# Patient Record
Sex: Male | Born: 1935
Health system: Southern US, Community
[De-identification: ages and names within clinical notes are randomized; demographics above are authoritative.]

## PROBLEM LIST (undated history)

## (undated) DIAGNOSIS — E785 Hyperlipidemia, unspecified: Secondary | ICD-10-CM

## (undated) DIAGNOSIS — I10 Essential (primary) hypertension: Secondary | ICD-10-CM

## (undated) DIAGNOSIS — J449 Chronic obstructive pulmonary disease, unspecified: Secondary | ICD-10-CM

## (undated) DIAGNOSIS — I219 Acute myocardial infarction, unspecified: Secondary | ICD-10-CM

## (undated) DIAGNOSIS — Z951 Presence of aortocoronary bypass graft: Secondary | ICD-10-CM

## (undated) DIAGNOSIS — I639 Cerebral infarction, unspecified: Secondary | ICD-10-CM

## (undated) DIAGNOSIS — N289 Disorder of kidney and ureter, unspecified: Secondary | ICD-10-CM

## (undated) DIAGNOSIS — I2510 Atherosclerotic heart disease of native coronary artery without angina pectoris: Secondary | ICD-10-CM

## (undated) HISTORY — DX: Disorder of kidney and ureter, unspecified: N28.9

## (undated) HISTORY — DX: Presence of aortocoronary bypass graft: Z95.1

## (undated) HISTORY — DX: Hyperlipidemia, unspecified: E78.5

---

## 1992-01-14 DIAGNOSIS — Z951 Presence of aortocoronary bypass graft: Secondary | ICD-10-CM

## 1992-01-14 HISTORY — PX: CORONARY ARTERY BYPASS GRAFT: SHX141

## 1992-01-14 HISTORY — DX: Presence of aortocoronary bypass graft: Z95.1

## 1999-07-27 ENCOUNTER — Encounter: Payer: Self-pay | Admitting: Emergency Medicine

## 1999-07-27 ENCOUNTER — Emergency Department (HOSPITAL_COMMUNITY): Admission: EM | Admit: 1999-07-27 | Discharge: 1999-07-27 | Payer: Self-pay | Admitting: Emergency Medicine

## 2002-12-25 ENCOUNTER — Inpatient Hospital Stay (HOSPITAL_COMMUNITY): Admission: EM | Admit: 2002-12-25 | Discharge: 2002-12-27 | Payer: Self-pay | Admitting: Emergency Medicine

## 2002-12-26 ENCOUNTER — Encounter (INDEPENDENT_AMBULATORY_CARE_PROVIDER_SITE_OTHER): Payer: Self-pay | Admitting: *Deleted

## 2003-01-09 ENCOUNTER — Ambulatory Visit (HOSPITAL_COMMUNITY): Admission: RE | Admit: 2003-01-09 | Discharge: 2003-01-09 | Payer: Self-pay | Admitting: Neurology

## 2003-01-17 ENCOUNTER — Encounter: Admission: RE | Admit: 2003-01-17 | Discharge: 2003-02-01 | Payer: Self-pay | Admitting: Ophthalmology

## 2003-04-27 ENCOUNTER — Inpatient Hospital Stay (HOSPITAL_COMMUNITY): Admission: EM | Admit: 2003-04-27 | Discharge: 2003-04-29 | Payer: Self-pay | Admitting: Emergency Medicine

## 2003-04-28 HISTORY — PX: CORONARY ANGIOPLASTY WITH STENT PLACEMENT: SHX49

## 2003-05-15 ENCOUNTER — Inpatient Hospital Stay (HOSPITAL_COMMUNITY): Admission: EM | Admit: 2003-05-15 | Discharge: 2003-05-18 | Payer: Self-pay

## 2003-06-06 ENCOUNTER — Emergency Department (HOSPITAL_COMMUNITY): Admission: EM | Admit: 2003-06-06 | Discharge: 2003-06-06 | Payer: Self-pay | Admitting: Emergency Medicine

## 2004-12-03 ENCOUNTER — Emergency Department (HOSPITAL_COMMUNITY): Admission: EM | Admit: 2004-12-03 | Discharge: 2004-12-03 | Payer: Self-pay | Admitting: Emergency Medicine

## 2004-12-20 ENCOUNTER — Encounter: Admission: RE | Admit: 2004-12-20 | Discharge: 2004-12-20 | Payer: Self-pay | Admitting: Specialist

## 2005-04-09 IMAGING — CT CT ANGIO HEAD
2 of 13 series · 3 of 33 positions shown · IV contrast (120 ML OMNI 300)
Comparison: none

CLINICAL DATA: Weakness.  Possible stroke. 
CT ANGIO NECK
Initial noncontrast images of the neck were followed by 1.25 mm axial images during the bolus infusion of 100 cc of contrast.  From this axial data set coronal, sagittal and oblique formatted images were obtained. 
Axial images reveal bilateral carotid calcification at the bifurcation.  Soft tissues are relatively symmetric.  There is no significant adenopathy present.  
The right carotid contains a significant stenosis. I would estimate it to be 75 to 90 percent.  There may be some mild post-stenotic dilatation.  A significant portion of this plaque is calcified.
On the left, there is significant carotid calcification as well.  There does not appear to be the same degree of stenosis as is present on the right.  I would estimate the degree of stenosis to be approximately 50 percent but it is not flow reducing.  
IMPRESSION
1.  75 to 90 percent right internal carotid artery stenosis at its origin. 
2.  50 percent stenosis of the left internal carotid artery at its origin. 
3.  Both stenoses are heavily calcified. 
CT ANGIO OF THE HEAD 
Following examination of the neck, CT angiography of the head was performed.  Again, 1.25 mm non-contrast images were obtained from the skull base through the circle of Willis followed by bolus infusion of 100 cc of contrast.  From the axial data set , sagittal, coronal, and 3-D reformations were generated. 
  Non-contrast examination through the skull base shows no erosive or significant calcific lesion.  The unenhanced appearance of the visualized portions of the brain is within normal limits.  There appears to be a 50 to 75 percent stenosis in the cavernous ICA on the right.  Mild non-stenotic atheromatous change is seen in the left carotid cavernous segment.  I do not detect a significant MCA stenosis in the M1 segments intracranially. 
1.  Estimated 50 percent cavernous stenosis right ICA.
2.  Non-stenotic irregularity of the left cavernous ICA.
3.  No significant intracranial stenosis of the MCA trunks proximally can be identified.

[Series 6: recon 2: cta neck · axial · 0.29mm/px · z∈[-206,-138]mm · 2 of 165 slices shown]
[im 55/165  soft-tissue]
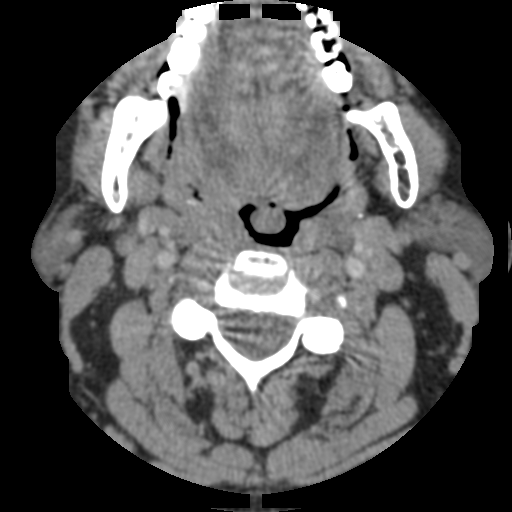
[im 110/165  bone]
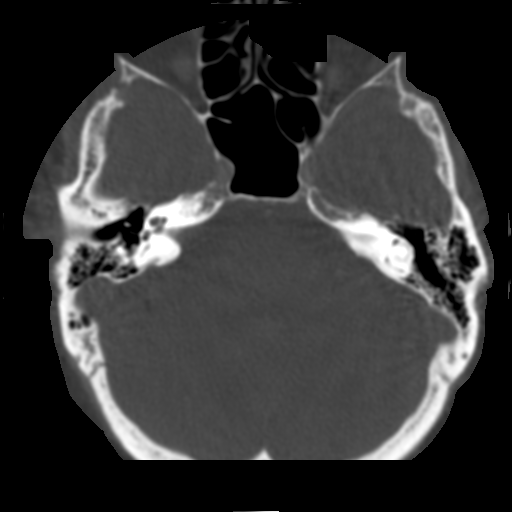

[Series 604: reformatted · sagittal · 0.29mm/px · 1 of 59 slices shown]
[im 30/59  soft-tissue]
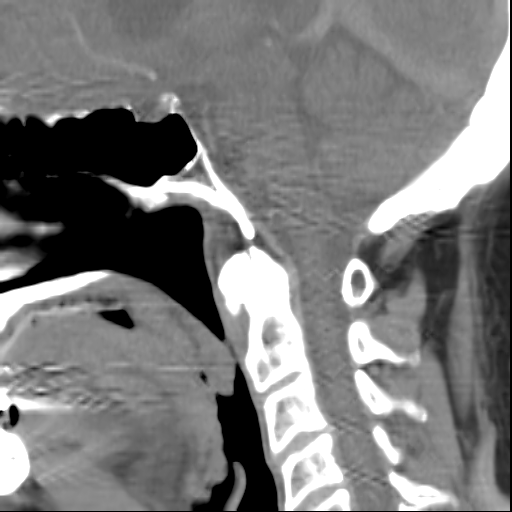

[3 of 33 positions shown; findings below may reference images not displayed]

## 2006-03-08 ENCOUNTER — Ambulatory Visit: Payer: Self-pay | Admitting: Internal Medicine

## 2006-03-08 ENCOUNTER — Inpatient Hospital Stay (HOSPITAL_COMMUNITY): Admission: EM | Admit: 2006-03-08 | Discharge: 2006-03-10 | Payer: Self-pay | Admitting: *Deleted

## 2008-09-10 ENCOUNTER — Emergency Department (HOSPITAL_COMMUNITY): Admission: EM | Admit: 2008-09-10 | Discharge: 2008-09-10 | Payer: Self-pay | Admitting: Emergency Medicine

## 2008-10-13 HISTORY — PX: TRANSTHORACIC ECHOCARDIOGRAM: SHX275

## 2009-12-15 ENCOUNTER — Inpatient Hospital Stay (HOSPITAL_COMMUNITY)
Admission: EM | Admit: 2009-12-15 | Discharge: 2009-12-19 | Payer: Self-pay | Source: Home / Self Care | Attending: Internal Medicine | Admitting: Internal Medicine

## 2009-12-16 ENCOUNTER — Encounter (INDEPENDENT_AMBULATORY_CARE_PROVIDER_SITE_OTHER): Payer: Self-pay | Admitting: Internal Medicine

## 2009-12-17 ENCOUNTER — Encounter (INDEPENDENT_AMBULATORY_CARE_PROVIDER_SITE_OTHER): Payer: Self-pay | Admitting: Internal Medicine

## 2009-12-18 ENCOUNTER — Encounter (INDEPENDENT_AMBULATORY_CARE_PROVIDER_SITE_OTHER): Payer: Self-pay | Admitting: Internal Medicine

## 2010-03-25 LAB — CROSSMATCH
ABO/RH(D): O NEG
ABO/RH(D): O NEG
Antibody Screen: NEGATIVE
Antibody Screen: NEGATIVE
Unit division: 0
Unit division: 0

## 2010-03-25 LAB — BASIC METABOLIC PANEL
BUN: 7 mg/dL (ref 6–23)
CO2: 20 mEq/L (ref 19–32)
CO2: 21 mEq/L (ref 19–32)
CO2: 24 mEq/L (ref 19–32)
Calcium: 7.6 mg/dL — ABNORMAL LOW (ref 8.4–10.5)
Calcium: 8.1 mg/dL — ABNORMAL LOW (ref 8.4–10.5)
Calcium: 8.3 mg/dL — ABNORMAL LOW (ref 8.4–10.5)
Chloride: 110 mEq/L (ref 96–112)
Creatinine, Ser: 1.27 mg/dL (ref 0.4–1.5)
Creatinine, Ser: 1.9 mg/dL — ABNORMAL HIGH (ref 0.4–1.5)
GFR calc Af Amer: 42 mL/min — ABNORMAL LOW (ref >60)
GFR calc Af Amer: 60 mL/min — ABNORMAL LOW (ref >60)
GFR calc Af Amer: >60 mL/min (ref >60)
GFR calc non Af Amer: 35 mL/min — ABNORMAL LOW (ref >60)
GFR calc non Af Amer: 49 mL/min — ABNORMAL LOW (ref >60)
GFR calc non Af Amer: >60 mL/min (ref >60)
Potassium: 4 mEq/L (ref 3.5–5.1)
Potassium: 4.2 mEq/L (ref 3.5–5.1)
Sodium: 136 mEq/L (ref 135–145)
Sodium: 137 mEq/L (ref 135–145)
Sodium: 138 mEq/L (ref 135–145)

## 2010-03-25 LAB — URINALYSIS, ROUTINE W REFLEX MICROSCOPIC
Bilirubin Urine: NEGATIVE
Glucose, UA: NEGATIVE mg/dL
Hgb urine dipstick: NEGATIVE
Ketones, ur: NEGATIVE mg/dL
Nitrite: NEGATIVE
Protein, ur: NEGATIVE mg/dL
Specific Gravity, Urine: 1.024 (ref 1.005–1.030)
Urobilinogen, UA: 0.2 mg/dL (ref 0.0–1.0)
pH: 5.5 (ref 5.0–8.0)

## 2010-03-25 LAB — FOLATE: Folate: >20 ng/mL

## 2010-03-25 LAB — CBC
HCT: 26.4 % — ABNORMAL LOW (ref 39.0–52.0)
HCT: 26.7 % — ABNORMAL LOW (ref 39.0–52.0)
HCT: 27.5 % — ABNORMAL LOW (ref 39.0–52.0)
Hemoglobin: 8.8 g/dL — ABNORMAL LOW (ref 13.0–17.0)
Hemoglobin: 8.8 g/dL — ABNORMAL LOW (ref 13.0–17.0)
Hemoglobin: 9.1 g/dL — ABNORMAL LOW (ref 13.0–17.0)
MCH: 26.1 pg (ref 26.0–34.0)
MCH: 27.8 pg (ref 26.0–34.0)
MCH: 28.1 pg (ref 26.0–34.0)
MCHC: 32 g/dL (ref 30.0–36.0)
MCHC: 32.9 g/dL (ref 30.0–36.0)
MCV: 81.6 fL (ref 78.0–100.0)
MCV: 84.2 fL (ref 78.0–100.0)
Platelets: 176 10*3/uL (ref 150–400)
Platelets: 187 10*3/uL (ref 150–400)
Platelets: 252 10*3/uL (ref 150–400)
RBC: 3.15 MIL/uL — ABNORMAL LOW (ref 4.22–5.81)
RBC: 3.24 MIL/uL — ABNORMAL LOW (ref 4.22–5.81)
RBC: 3.37 MIL/uL — ABNORMAL LOW (ref 4.22–5.81)
RDW: 16.1 % — ABNORMAL HIGH (ref 11.5–15.5)
RDW: 16.2 % — ABNORMAL HIGH (ref 11.5–15.5)
RDW: 16.4 % — ABNORMAL HIGH (ref 11.5–15.5)
WBC: 5.4 10*3/uL (ref 4.0–10.5)
WBC: 6 10*3/uL (ref 4.0–10.5)
WBC: 7.6 K/uL (ref 4.0–10.5)

## 2010-03-25 LAB — POCT CARDIAC MARKERS
CKMB, poc: <1 ng/mL — ABNORMAL LOW (ref 1.0–8.0)
Myoglobin, poc: 105 ng/mL (ref 12–200)
Troponin i, poc: <0.05 ng/mL (ref 0.00–0.09)

## 2010-03-25 LAB — ABO/RH: ABO/RH(D): O NEG

## 2010-03-25 LAB — RETICULOCYTES
RBC.: 2.9 MIL/uL — ABNORMAL LOW (ref 4.22–5.81)
Retic Count, Absolute: 29 10*3/uL (ref 19.0–186.0)
Retic Ct Pct: 1 % (ref 0.4–3.1)

## 2010-03-25 LAB — APTT: aPTT: 31 seconds (ref 24–37)

## 2010-03-25 LAB — IRON AND TIBC
Iron: 49 ug/dL (ref 42–135)
Saturation Ratios: 16 % — ABNORMAL LOW (ref 20–55)
TIBC: 314 ug/dL (ref 215–435)
UIBC: 265 ug/dL

## 2010-03-25 LAB — SAMPLE TO BLOOD BANK

## 2010-03-25 LAB — DIFFERENTIAL
Basophils Absolute: 0 K/uL (ref 0.0–0.1)
Basophils Relative: 0 % (ref 0–1)
Eosinophils Absolute: 0 10*3/uL (ref 0.0–0.7)
Eosinophils Relative: 1 % (ref 0–5)
Lymphocytes Relative: 21 % (ref 12–46)
Lymphs Abs: 1.6 10*3/uL (ref 0.7–4.0)
Monocytes Absolute: 0.9 K/uL (ref 0.1–1.0)
Monocytes Relative: 12 % (ref 3–12)
Neutro Abs: 5 10*3/uL (ref 1.7–7.7)
Neutrophils Relative %: 66 % (ref 43–77)

## 2010-03-25 LAB — BASIC METABOLIC PANEL WITH GFR
BUN: 44 mg/dL — ABNORMAL HIGH (ref 6–23)
Chloride: 104 meq/L (ref 96–112)
Glucose, Bld: 131 mg/dL — ABNORMAL HIGH (ref 70–99)
Potassium: 4.3 meq/L (ref 3.5–5.1)

## 2010-03-25 LAB — HEMOCCULT GUIAC POC 1CARD (OFFICE): Fecal Occult Bld: POSITIVE

## 2010-03-25 LAB — HEMOGLOBIN AND HEMATOCRIT, BLOOD
HCT: 26.6 % — ABNORMAL LOW (ref 39.0–52.0)
HCT: 30.6 % — ABNORMAL LOW (ref 39.0–52.0)
Hemoglobin: 10 g/dL — ABNORMAL LOW (ref 13.0–17.0)

## 2010-03-25 LAB — FERRITIN: Ferritin: 11 ng/mL — ABNORMAL LOW (ref 22–322)

## 2010-03-25 LAB — PROTIME-INR
INR: 1.17 (ref 0.00–1.49)
Prothrombin Time: 15.1 seconds (ref 11.6–15.2)

## 2010-03-25 LAB — VITAMIN B12: Vitamin B-12: >2000 pg/mL — ABNORMAL HIGH (ref 211–911)

## 2010-04-20 LAB — BASIC METABOLIC PANEL
BUN: 11 mg/dL (ref 6–23)
GFR calc Af Amer: >60 mL/min (ref >60)
GFR calc non Af Amer: 51 mL/min — ABNORMAL LOW (ref >60)
Potassium: 4.4 mEq/L (ref 3.5–5.1)
Sodium: 138 mEq/L (ref 135–145)

## 2010-04-20 LAB — CBC
HCT: 37.3 % — ABNORMAL LOW (ref 39.0–52.0)
Platelets: 262 10*3/uL (ref 150–400)
RBC: 3.96 MIL/uL — ABNORMAL LOW (ref 4.22–5.81)
WBC: 10.7 10*3/uL — ABNORMAL HIGH (ref 4.0–10.5)

## 2010-04-20 LAB — URINALYSIS, ROUTINE W REFLEX MICROSCOPIC
Ketones, ur: NEGATIVE mg/dL
Nitrite: NEGATIVE
Specific Gravity, Urine: 1.02 (ref 1.005–1.030)
pH: 6 (ref 5.0–8.0)

## 2010-04-20 LAB — DIFFERENTIAL
Eosinophils Relative: 3 % (ref 0–5)
Lymphocytes Relative: 13 % (ref 12–46)
Lymphs Abs: 1.4 10*3/uL (ref 0.7–4.0)

## 2010-05-31 NOTE — Discharge Summary (Signed)
NAME:  Frederick Kirby, Frederick Kirby                           ACCOUNT NO.:  [id number]   MEDICAL RECORD NO.:  [id number]                   PATIENT TYPE:  INP   LOCATION:  3038                                 FACILITY:  [facility]   PHYSICIAN:  Pramod P. Pearlean Brownie, MD                 DATE OF BIRTH:  28-Dec-1935   DATE OF ADMISSION:  05/15/2003  DATE OF DISCHARGE:  05/18/2003                                 DISCHARGE SUMMARY   __________   ADMISSION DIAGNOSIS:  Transient ischemic attack.   DISCHARGE DIAGNOSES:  1. Generalized symptoms of weakness, numbness, and blurred vision of unclear     etiology.  No definite new stroke visualized on computed tomograph scan.     Magnetic resonance angiography not possible due to recent cardiac stent.  2. Ischemic heart disease.  3. Hyperlipidemia.  4. Hypertension.  5. Hyperhomocysteinemia.   SUBJECTIVE:  The patient is a 75 year old gentleman who was admitted for  evaluation for symptoms of generalized weakness, blurred vision, and slurred  speech.  When seen by Dr. Vickey Huger on admission, had complained of some  right facial numbness, weakness, and blurred vision and her initial  impression was of vertebrobasilar TIA.  However, when I spoke to the  patient, she denied right-sided weakness and just told me that he had had a  gradual worsening of his previously existing neurological deficit of left  face and upper extremity paraesthesia following a recent stroke.  These  symptoms had continued actually for the last three to four weeks even prior  to recent cardiac stent.  The patient had undergone cardiac stent placed on  April 28, 2003, __________ for these symptoms, which have apparently  persisted even after the stent.  He was admitted to the stroke unit for risk  stratification workup.  He was placed on telemetry monitoring, which did not  reveal any cardiac arrhythmias.  Noncontrast CT scan of the head revealed no  evidence of any acute infarction.  He had  previously had stroke in December  of 2004 with some residual left upper extremity weakness, clumsiness, and  paresthesias which had persisted on and off.  MRA at that time had showed 50-  60% right internal carotid artery stenosis.  The patient was placed on  antiplatelet therapy and was on Plavix.  Following his recent cardiac stent,  aspirin was added to the Plavix.  The patient had other vascular risk  factors in the form of hypertension and hyperlipidemia.  The patient  subsequently underwent a CT angiogram since an MRI and MRA could not be done  due to his recent cardiac stent.  The CT angiogram suggested calcific lesion  in the right internal carotid artery with a possible 75-90% right ICA  stenosis.  However, on the carotid ultrasound, the peak systolic velocity in  the right internal carotid artery was only 164 with end-diastolic velocity  of 45 and this  fitted more with the 60-70% stenosis by ultrasound.  Due to  the discordant findings between the CTA and the carotid ultrasound, a  catheter angiogram was done to evaluate the right internal carotid artery  further by Dr. Corliss Skains on May 17, 2003.  This revealed only a 50-60% right  internal carotid artery stenosis with a small pseudoaneurysm.  There was  mild stenosis noted of the left vertebral artery ostium.  The patient has  noted improvement in his symptoms and on the date of discharge he said that  he had only minimal left face paresthesias.  He was able to ambulate  independently and he had only minimal decreased fine finger movements on the  left.  The rest of the laboratory evaluation showed elevated cholesterol of  224 with elevated LDL of 151.  The homocystine was also elevated at 16.5.  The hemoglobin A1c was normal.  The WBC count and blood chemistries were  unremarkable.  The urine drug screen was negative.  The patient was started  on Foltx one tablet daily for his elevated homocystine and advised to  increase the  dose of his Lovastatin from 20-30 mg a day for his elevated  lipids.  He was advised to stay on the aspirin and Plavix for the next four  to six weeks as indicated by his cardiologist and subsequently go back on  Plavix alone.  He would require serial six monthly carotid ultrasound  followup for his right ICA stenosis.  He was advised to follow up with Dr.  Pearlean Brownie in two months and with his cardiologist, Dr. Tresa Endo, as needed and with  his primary physician, Dr. Jeannetta Nap, in two to three weeks.   DISCHARGE MEDICATIONS:  1. Aspirin 81 mg a day.  2. Plavix 75 mg a day.  3. Hydrochlorothiazide 12.5 mg a day.  4. Toprol XL 25 mg once a day.  5. Lovastatin 30 mg a day.  6. Foltx one tablet daily.                                                Pramod P. Pearlean Brownie, MD    PPS/MEDQ  D:  05/18/2003  T:  05/18/2003  Job:  [id number]   cc:   Dr. Nicole Kindred, M.D.  [address], [address]  [place], [place] [address]  Fax: [phone]

## 2010-05-31 NOTE — Discharge Summary (Signed)
Frederick Kirby, Frederick Kirby                 ACCOUNT NO.:  [id number]   MEDICAL RECORD NO.:  [id number]          PATIENT TYPE:  INP   LOCATION:  6741                         FACILITY:  [facility]   PHYSICIAN:  Edsel Petrin, D.O.DATE OF BIRTH:  [birth year]   DATE OF ADMISSION:  03/08/2006  DATE OF DISCHARGE:  03/10/2006                               DISCHARGE SUMMARY   DISCHARGE DIAGNOSES:  1. Community-acquired pneumonia.  2. Hyponatremia secondary to volume depletion.  3. Chronic obstructive pulmonary disease exacerbation.  4. History of transient ischemic attack in 2006.  5. Coronary artery disease with cardiac stent placed in 2005.  6. Coronary artery bypass graft surgery in 1997.  7. History of cerebrovascular accident in 2004 with right upper      extremity weakness.  8. Right internal carotid artery stenosis.  9. Hyperlipidemia.  10.Hypertension, controlled on p.o. medications.  11.History of tobacco abuse, quit smoking in December 2007.  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   DISCHARGE MEDICATIONS:  1. Avalox 400 mg one tab daily x7 days.  2. Prednisone taper 60, 50, 40, 30, 20, 10 x6 days.  3. Albuterol MDI one to two puffs q.4-6 hours for wheezing.  4. Spiriva one inhalation treatment daily.  5. Aspirin 81 mg daily.  6. Metoprolol 25 mg daily.  7. Vytorin 10-80 daily.  8. Plavix 75 mg daily.  9. Norvasc 10 mg daily.  10.Altace 10 mg daily.  11.Trazodone 150 mg nightly.  12.Discontinued hydrochlorothiazide.  13.Discontinued amoxicillin.   DISPOSITION AND FOLLOWUP:  Frederick Kirby was discharged in good condition to  his private residence.  He has a followup appointment with Dr. Jeannetta Kirby in  one to two weeks.  At this time, he will need to be evaluated for  resolution of his community-acquired pneumonia and undergo management  and evaluation of his chronic lung disease.   PROCEDURES PERFORMED:  None.   CONSULTATIONS:  None.   BRIEF ADMITTING H&P:  Frederick Kirby  is a 75 year old man who presented to  the emergency department complaining of increased shortness of breath  and pleuritic chest pain for seven days prior to admission.  He reported  one week of flu-like symptoms including cough, congestion, fever, and  muscle aches.  He failed to improve with conservative care and felt like  his symptoms were getting worse.  He saw Dr. Jeannetta Kirby three days prior to  admission, who treated him symptomatically and gave him amoxicillin for  an upper respiratory tract infection.  He continued to have cough with  sputum production and constitutional signs and symptoms of infection,  including decreased appetite and fatigue.   Please see paper chart for complete details of admission H&P.   VITAL SIGNS ON ADMISSION:  Temperature 96.9, blood pressure 141/73,  pulse 63, respiratory rate 22, O2 saturation 91% on room air.   PHYSICAL EXAMINATION:  GENERAL:  Frederick Kirby was in no acute distress.  He  was speaking in normal sentences.  He was not tachypneic.  LUNGS:  On auscultation of his lungs he had decreased air movement in  his bases  bilaterally.  He had scattered wheezes, rhonchi, and had  bilateral basilar crackles.  HEART:  On auscultation of his heart he had a mildly irregular rhythm,  characterized by mostly PVCs.  A 2/6 mid systolic murmur was  auscultated.  He had no extremity edema.  He had no evidence of  cyanosis.  NEUROLOGICAL:  Consistent with his reports of a historic CVA effecting  his left side with mildly decreased sensation on the left, but his  strength was otherwise intact and there were no additional focal  findings.   LABORATORIES ON ADMISSION:  Sodium 122, potassium 3.8, chloride 90,  bicarbonate 21, BUN 9, creatinine 0.8, glucose 135.  Anion gap 11.  LFTs  within normal limits.  On CBC:  WBC 7.8, hemoglobin 14.7, hematocrit  42.4, platelets 188.  Chest x-ray showed bibasilar atelectasis,  infiltrate in the right medial base.  BNP was  less than 30.   HOSPITAL COURSE BY PROBLEM:  Problem #1:  Community-acquired pneumonia:  Frederick Kirby had evidence of an early pneumonia seen in the right medial  lung base on chest x-ray.  He was empirically started on IV Rocephin and  azithromycin pending respiratory cultures.  He was also supported with  O2 by nasal cannula.  He maintained his O2 saturations greater than 90%,  so the O2 was titrated to room air.  He was treated symptomatically with  albuterol and Atrovent nebulizers for his wheezing.  He was also placed  on a prednisone taper, suspecting that there is an underlying chronic  lung disease, such as COPD causing the severity of his symptoms.  He  improved symptomatically with treatment.  He was transitioned to p.o.  Avelox and will complete seven days of Avelox upon discharge.  He was  rulled out for cardiac chest pain with a normal 12-lead EKG, no  abnormalities on telemetry monitoring and negative cardiac enzymes.   Problem #2:  Hyponatremia:  Frederick Kirby hyponatremia was secondary to  volume depletion from decreased p.o. intake secondary to his illness.  He responded to normal saline fluid boluses and continuous fluid  administration.  We slowly corrected his hyponatremia to a near normal  level at discharge.  With resolution of his infection and resumption of  normal p.o. intake, this imbalance will likely self-correct.  We held  his hydrochlorothiazide diuretic, as to not exacerbate his electrolyte  imbalance and this can be restarted once he regains normal intravascular  volume.   DISCHARGE VITAL SIGNS:  Temperature 97.7, pulse 64, respirations 18,  blood pressure 108/58, O2 saturation 93% on room air.   DISCHARGE LABORATORIES:  Sodium 133, potassium 4.2, chloride 102,  bicarbonate 21, glucose 111, BUN 13, creatinine 0.9.  Sputum culture was  not representative of lower respiratory secretions.  Legionella antigen was negative.  Strep pneumonia urinary antigen was  negative.   The patient received a flu vaccination, as well as a pneumococcal  vaccination at discharge.      Edsel Petrin, D.O.  Electronically Signed     ELG/MEDQ  D:  03/18/2006  T:  03/19/2006  Job:  [id number]   cc:   Windle Guard, M.D.

## 2010-05-31 NOTE — H&P (Signed)
NAME:  Frederick Kirby, Frederick Kirby                           ACCOUNT NO.:  [id number]   MEDICAL RECORD NO.:  [id number]                   PATIENT TYPE:  INP   LOCATION:  1832                                 FACILITY:  [facility]   PHYSICIAN:  Nicki Guadalajara, M.D.                  DATE OF BIRTH:  1935-09-04   DATE OF ADMISSION:  04/27/2003  DATE OF DISCHARGE:                                HISTORY & PHYSICAL   CHIEF COMPLAINT:  Chest pain, arm pain, and fatigue.   HISTORY OF PRESENT ILLNESS:  The patient is a 75 year old male who had  bypass surgery x 9 in 1994.  He basically had no cardiac followup since then  but has done well.  In December 2004, he was admitted by the neurology  service after he awakened with left hand paralysis and left leg weakness.  He was found to have a right parietal stroke.  From that standpoint, he has  been making progress, but he complains of weakness and early fatigability  since his stroke.  Yesterday he had an episode of substernal chest pain  described as an ache that radiated to his arms and his neck.  He denies  shortness of breath, nausea, or diaphoresis.  He was sent from Dr. Jeannetta Nap  office for further evaluation.   PAST MEDICAL HISTORY:  1. Hypertension.  2. Hyperlipidemia.   There is no prior history of diabetes.   CURRENT MEDICATIONS:  1. Lovastatin daily.  2. Aspirin daily.  3. Hydrochlorothiazide 25 mg daily.   He was put on Crestor during his hospitalization on the neurology service,  but Dr. Jeannetta Nap has changed this to Lovastatin.  Apparently he could not  tolerate CRESTOR.   ALLERGIES:  No known drug allergies.   SOCIAL HISTORY:  He is married.  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  He has  five horses.  He is a former two-pack-a-day smoker until his recent stroke.  He also used to drink a six-pack of beer a day until his stroke.  He has  four children and 18 grandchildren.   FAMILY HISTORY:  His father died in his 63s of black lung.  His mother  died  of cancer in her 63s.  He has one brother who died of an aneurysm.   REVIEW OF SYSTEMS:  During his hospital admission in December, he had  moderate right internal carotid artery stenosis by Doppler.  This is being  followed by the neurology service.  His echocardiogram showed normal LV  function.  There is no history of GI bleeding or melena.  Recent stool  guaiac done by Dr. Jeannetta Nap was negative.  He has not had prostate trouble and  does not have dysuria.  He denies any claudication.   PHYSICAL EXAMINATION:  VITAL SIGNS:  Blood pressure 176/91 on admission to  the emergency room.  Heart rate 78, respirations 12.  GENERAL: Well-developed, well-nourished male in no acute distress.  HEENT:  Normocephalic.  Extraocular movements are intact.  Sclerae are  nonicteric.  Lids and conjunctivae are  within normal limits.  NECK:  Without JVD.  He does have a soft left carotid artery bruit.  CHEST:  Reveals a few rhonchi on the right, otherwise clear.  CARDIAC:  Regular rate and rhythm without murmur, rub, or gallop.  Normal S1  and S2.  ABDOMEN:  Nontender.  No hepatosplenomegaly, no bruits.  EXTREMITIES:  No edema.  He has 2+/4 posterior tibial pulses bilaterally.  He has soft femoral artery bruits bilaterally.  NEUROLOGIC:  He does have some residual weakness in his left arm and leg,  but this is definitely improved from his stroke.   LABORATORY AND X-RAY DATA:  EKG shows sinus rhythm with nonspecific ST  changes.   Labs and chest x-ray are pending.   IMPRESSION:  1. Unstable angina.  2. Coronary artery disease, coronary artery bypass grafting x 9 in 1994 with     normal left ventricular function by recent echocardiogram.  3. Right brain cerebrovascular accident December 2004.  4. Hypertension.  5. Chronic obstructive pulmonary disease and smoking history.  6. Hyperlipidemia.  7. Peripheral vascular disease with moderate right internal carotid artery     stenosis by Doppler.    PLAN:  1. The patient will be admitted to telemetry.  2. Start IV heparin.  3. Rule out for MI.  4. Will check labs including a TSH.  5. He may need diagnostic catheterization and will keep him n.p.o. after     midnight.      Abelino Derrick, P.A.                      Nicki Guadalajara, M.D.    Lenard Lance  D:  04/27/2003  T:  04/27/2003  Job:  [id number]

## 2010-05-31 NOTE — H&P (Signed)
NAME:  MEL, LANGAN                           ACCOUNT NO.:  [id number]   MEDICAL RECORD NO.:  [id number]                   PATIENT TYPE:  EMS   LOCATION:  MAJO                                 FACILITY:  [facility]   PHYSICIAN:  Melvyn Novas, M.D.               DATE OF BIRTH:  1935-11-08   DATE OF ADMISSION:  05/15/2003  DATE OF DISCHARGE:                                HISTORY & PHYSICAL   Mr. Moffa is a patient of Pramod P. Pearlean Brownie, M.D., and Nicki Guadalajara, M.D., at  the [facility], who was admitted in December of  2004 with a stroke and had acute parietal and occipital infarctions  secondary to intracranial atherosclerotic occlusive disease.  He also  suffered from risk factors, such has hyperlipidemia and hypertension and he  was at that time still an active smoker.  During his hospitalization, Mr.  Fjeld was also diagnosed with coronary artery disease.  He just underwent a  stent placement on April 28, 2003, by Richard A. Alanda Amass, M.D.  He was  told that there are still arteries in his heart that could not be reopened  or stented.  He states that with his initial stroke, he had left hand  weakness, clumsiness, and even left face with esthesias, as well as grip and  pinch strength loss from which he recovered.  He became short of breath  prior to having his angioplasty treatments and stent placement and had  recovered from that step then very well indeed.  He states that he felt very  good for the last 10 days until two days ago when he again became short of  breath, this time associated with some right-sided facial numbness and right  arm weakness and numbness, blurring of vision, and according to his wife,  dysarthria.  This is an entirely new picture for the patient, who fears that  he might have suffered a new stroke and therefore presented to the ER.   MEDICATIONS:  He was discharged on:  1. Plavix.  2. Aspirin.  3. Cholesterol-lowering drug.  4.  Lovastatin 20 mg a day.  5. Hydrochlorothiazide 12.5 mg a day.  6. Toprol 25 mg a day.  7. Nitroglycerin sublingual p.r.n., which he still takes.   ALLERGIES:  He has no known drug allergies.   FAMILY HISTORY:  Hypertension.  Two brothers had bypass surgery.  One  brother suffered from a malignancy.  One brother had an abdominal aortic  aneurysm that ruptured.   SOCIAL HISTORY:  The patient is an ex-smoker, stopped three weeks ago.  He  is married and has four healthy children and some stepchildren with his  second wife.  One of his sons has hypercholesterolemia at age 68.   PHYSICAL EXAMINATION:  VITAL SIGNS:  The patient's blood pressure is today  149/81, heart rate is regular at 64, temperature 98 degrees Fahrenheit, and  oxygenation  on room air 96%.  HEENT:  The mucous membranes are pale, but perfused.  SKIN:  No edema.  No clubbing.  The patient has pale feet, cold to touch  with decreased dorsalis pedis pulses, but they are not bluish.  NECK:  No carotid bruit.  MENTAL STATUS:  Alert and oriented x 3.  I cannot detect dysarthria or  aphasia.  NEUROLOGIC:  Cranial Nerves:  Pupils react equally to light and  accommodation.  Full extraocular movements.  No visual field deficits.  No  papilledema.  No facial asymmetry or dysphagia.  Tongue and uvula midline.  Full neck range of motion.  Motor Exam:  5/5 bilaterally.  Decreased toes  bilaterally to plantar stimulation.  Deep tendon reflexes 1+.  Sensory:  The  patient feels numb in a tingling dysesthetic way that is present on both  lower extremities in his feet, on the right side of his face, and was  temporarily present for his right upper extremity.  Now he states that only  the fingers tingle and this occurs on both hands.  Gait:  Fully intact.  The  patient complains subjectively of dizziness.  His Romberg is negative.  He  did complain of vertigo once he reclined in his hospital bed and there could  be a vertebrobasilar  component to this.  No nystagmus was associated.   ASSESSMENT:  Admit for evaluation of vertebrobasilar abnormalities and place  on IV heparin.  He will hold aspirin and Plavix at this time.  We will  consult Drs. Tresa Endo and [place] for followup.  The patient cannot undergo  an MRI due to the recent stent placement.  We have perhaps to make due with  a CT angiogram if Dr. Pearlean Brownie decides that he needs this.  Otherwise a  transcranial Doppler study is suggested.                                                Melvyn Novas, M.D.    CD/MEDQ  D:  05/15/2003  T:  05/15/2003  Job:  [id number]

## 2010-05-31 NOTE — Discharge Summary (Signed)
NAME:  Frederick Kirby, Frederick Kirby                           ACCOUNT NO.:  [id number]   MEDICAL RECORD NO.:  [id number]                   PATIENT TYPE:  INP   LOCATION:  3033                                 FACILITY:  [facility]   PHYSICIAN:  Pramod P. Pearlean Brownie, MD                 DATE OF BIRTH:  07/02/1935   DATE OF ADMISSION:  12/25/2002  DATE OF DISCHARGE:  12/27/2002                                 DISCHARGE SUMMARY   ADMISSION DIAGNOSIS:  Stroke.   DISCHARGE DIAGNOSES:  1. Right parietal and occipital infarction secondary to intracranial     atherosclerotic disease.  2. Hyperlipidemia.  3. Hypertension.   HISTORY OF PRESENT ILLNESS:  Frederick Kirby is a pleasant 75 year old gentleman  who was admitted for evaluation of sudden onset of left hand weakness.  Kindly see Dr. Darl Householder H&P for details.  The patient presented beyond the  timeframe for IV thrombosis or stroke study consideration.  On exam, he had  left-sided dysesthesia and left hand and wrist weakness with good strength  in the proximal muscles.  A noncontrast CT scan of the head was obtained in  the emergency room which did not reveal any active or old infarction.  He  was admitted to the stroke service for risk stratification workup.   LABORATORY DATA:  Telemetry monitoring did not show any significant cardiac  arrhythmias.  MRI scan of the brain showed acute infarction involving the  right parietal lobe and smaller focal infarction in the right occipital lobe  as well.  MRA of the intracranial circulation showed mild atherosclerotic  changes involving the right middle cerebral artery branches.  MRA of the  neck revealed moderate stenosis of the right internal carotid artery with  ulceration, as well as moderate stenosis on the left.  There was probable  high-grade stenosis at the origin of the right vertebral artery.  Carotid  ultrasound confirmed moderate stenosis of the right internal carotid artery,  60-80% only, and mild 40-60% on  the left.  Vertebral artery flow was  antegrade.  Doppler studies were unremarkable.  The lipid profile showed an  elevated total cholesterol of 201.  LDL was elevated at 141.  Triglycerides  and HDL were normal.  Homocystine was normal at 13.48.  The fasting glucose  was 120.  The hemoglobin AC1 was normal at 5.7.  ESR was 2 mm.   HOSPITAL COURSE:  The patient was seen in consultation by speech therapy,  occupational therapy, and physical therapy.  He was started on aspirin for  secondary stroke prevention, as well as Crestor for  hyperlipidemia.  His  urinalysis showed evidence of a urinary tract infection and he was started  on Cipro 500 mg twice a day for a total of seven days.  At the time of  discharge, he had regained some strength in his left hand, but still had  weakness of the intrinsic hand  muscles.  He was advised to follow up as an  outpatient with occupational therapy.  He was started on soft foods and  thickened liquids on the recommendation of speech pathology.   DISCHARGE MEDICATIONS:  1. Aspirin 325 mg daily.  2. Crestor 10 mg daily.  3. Cipro 500 mg twice a day for seven days only.  4. Hydrochlorothiazide 25 mg daily.   FOLLOWUP:  He was advised to follow up with his primary care physician, Dr.  Jeannetta Nap, in one month and with Dr. Pearlean Brownie in two months.                                               Pramod P. Pearlean Brownie, MD    PPS/MEDQ  D:  01/13/2003  T:  01/13/2003  Job:  [id number]   cc:   Dr. Jeannetta Nap

## 2010-05-31 NOTE — Discharge Summary (Signed)
NAME:  Frederick Kirby, Frederick Kirby                           ACCOUNT NO.:  [id number]   MEDICAL RECORD NO.:  [id number]                   PATIENT TYPE:  INP   LOCATION:  6527                                 FACILITY:  [facility]   PHYSICIAN:  Richard A. Alanda Amass, M.D.          DATE OF BIRTH:  1935/08/14   DATE OF ADMISSION:  04/27/2003  DATE OF DISCHARGE:  04/29/2003                                 DISCHARGE SUMMARY   DISCHARGE DIAGNOSES:  1. Subendocardial myocardial infarction this admission.  2. Coronary disease with coronary artery bypass grafting x9 in 1994 with     saphenous vein graft to ramus intermedius, TAXUS stent this admission.  3. Preserved left ventricular function with ejection fraction of 50%.  4. History of smoking.  5. Hypertension.  6. Treated hyperlipidemia.  7. History of right brain cerebrovascular accident December of 2004.   HOSPITAL COURSE:  Mr. Kloepfer is a 75 year old male followed by Dr. Tresa Endo and  Dr. Jeannetta Nap.  He had bypass surgery x9 in 1994.  He essentially had no  cardiac follow-up after that.  He has not had any symptoms.  In December of  2004, he was admitted with a right brain parietal stroke which he is  recovering from.  Since then, he has had weakness.  The day prior to  admission, he had substernal chest pain which radiated to his arms and his  neck.  He was referred from Dr. Jeannetta Nap' office to the ER where we saw him.  He was pain-free on admission.  He has been a smoker up until about a week  ago.  He was admitted to telemetry, started on IV heparin.  Troponins were  positive.  CK peaked at 185 with 12 MB.  He underwent diagnostic  catheterization April 28, 2003, by Dr. Jenne Campus.  This revealed a total RCA,  99% circumflex, total LAD, and total diagonal.  The patient had an SVG to  the LAD that was patent.  He had an SVG to the ramus that had a 95%  stenosis.  There is a question of an occluded SVG to the OM and SVG to the  RCA, he did have left to right  collaterals.  EF was 50%.  Left subclavian  was irregular and the LIMA was atretic.  He underwent TAXUS stenting to the  SVG to the ramus with good result.  Dr. Jenne Campus feels he will probably need  a staged intervention of the circumflex in four to six weeks.  We feel he  can be discharged April 29, 2003.   DISCHARGE MEDICATIONS:  1. Plavix 75 mg a day.  2. Aspirin 81 mg a day.  3. Lovastatin 20 mg a day.  4. Hydrochlorothiazide 12.5 mg a day.  5. Toprol XL 25 mg a day.  6. Nitroglycerin sublingual p.r.n.   LABORATORY DATA:  Sodium 130, potassium 3.4, BUN 7, creatinine 0.9.  CKs  peaked as noted above.  TSH 1.29.  White count 7.9, hemoglobin 14.1,  hematocrit 41.1, platelets 272.  Lipid profile shows cholesterol 201, HDL  45, LDL 138.  INR 0.9.  Liver functions are normal.   EKG reveals sinus rhythm with T-wave inversion lead I and aVL.   Chest x-ray shows low lung volumes with basilar atelectasis.   DISPOSITION:  The patient is discharged in stable condition and will follow  up with Dr. Tresa Endo as an outpatient.  He will most likely need further  adjustment of his lipids.  He will also need to be set up for intervention  in a few weeks.  He has an appointment to see Dr. Tresa Endo, May 17, 2003, at 2  p.m.      Abelino Derrick, P.A.                      Richard A. Alanda Amass, M.D.    Lenard Lance  D:  04/29/2003  T:  05/01/2003  Job:  [id number]   cc:   Windle Guard, M.D.  [address]  [place], [place] [address]  Fax: [phone]

## 2010-05-31 NOTE — Cardiovascular Report (Signed)
NAME:  Frederick Kirby, Frederick Kirby                           ACCOUNT NO.:  [id number]   MEDICAL RECORD NO.:  [id number]                   PATIENT TYPE:  INP   LOCATION:  6527                                 FACILITY:  [facility]   PHYSICIAN:  Darlin Priestly, M.D.             DATE OF BIRTH:  16-Aug-1935   DATE OF PROCEDURE:  04/28/2003  DATE OF DISCHARGE:  04/29/2003                              CARDIAC CATHETERIZATION   PROCEDURES:  1. Left heart catheterization.  2. Coronary angiography.  3. Left ventriculogram.  4. Left subclavian angiography.  5. Saphenous vein graft.  6. Saphenous vein graft to ramus intermedius - placement of intracoronary     stent.   ATTENDING PHYSICIAN:  Darlin Priestly, M.D.   COMPLICATIONS:  None.   INDICATIONS:  Mr. Mcgroarty is a 75 year old male, previous patient of Dr. Daphene Jaeger, with history of coronary artery bypass surgery in January 1994 by Dr.  Andrey Campanile.  Per his op report, he had a LIMA sequentially to LAD and diagonal,  vein graft to diagonal, sequential vein graft to OM-1 and OM-2, and vein  graft to PDA and posterior lateral branch.  The patient has had no interim  cardiovascular followup and has continued to smoke.  He did have a  cerebrovascular accident in December.  He presented to Dr. Tresa Endo on April 27, 2003 with recurrent chest pain and subsequently ruled in for myocardial  infarction.  He is now in for repeat catheterization to reassess his grafts.   DESCRIPTION OF OPERATION:  After obtaining informed written consent, the  patient was brought to the cardiac catheterization laboratory.  Right and  left groin were shaved and prepped and draped in the usual sterile fashion.  ECG monitor was established.  Using the modified Seldinger technique, a 6  French arterial sheath was inserted in the right femoral artery. A 6 French  diagnostic catheter was then used to performed diagnostic angiography.  This  reveals a medium size left main with no significant  disease.  The LAD is  totally occluded at its proximal portion.  The mid and distal LAD fill via a  patent saphenous vein graft to the mid LAD.  The vein graft does have a kink  in its mid segment.  It does not appear to be flow-limiting.  The LAD is a  large vessel which courses to the apex and gives large collateral branches  to the distal PDA.  This does retrograde fill the distal RCA into the  posterior lateral branch.   The left circumflex is a small vessel which has a 99% proximal lesion in the  AV groove or circumflex.   There is a patent saphenous vein graft to a ramus intermedius.  The vein  graft is irregular with a 95% distal lesion.  The remainder of the ramus  intermedius has no significant disease.   The saphenous vein graft to the  obtuse marginal is totally occluded.   Saphenous vein graft to the RCA is totally occluded.   Native RCA is totally occluded in its proximal portion.  However, the RCA as  previously stated does get large collateral flow from the LAD into the PDA  and posterior lateral branch.   LEFT VENTRICULOGRAM:  Left ventriculogram reveals preserved EF of 50%.   Left subclavian angiography reveals irregular left subclavian with what  appears to be an atretic LIMA.   HEMODYNAMICS:  Systemic arterial pressure 138/73, LV systemic pressure  130/9, LVEDP of 26.   INTERVENTIONAL PROCEDURE:  Saphenous vein graft to ramus.  Following  diagnostic angiography, the 6 [place] was exchanged for 7 [place] sheath and a  7 [place] LCB guiding catheter with side holes was then engaged in the vein  graft to the ramus.  Next, a 0.014 Forte guide wire was advanced down the  guiding catheter and used to cross into the ramus without difficulty.  Measurements were made with the [facility] guide wire.  Next, a Taxus 2.5 x 24-mm  stent was then used to cross the stenotic lesion.  This stent was then  deployed to a maximum of 12 atmospheres for a total of 41 seconds.  Followup   angiogram revealed no evidence of dissection or thrombus with TIMI-3 flow.  We did a second inflation to 14 atmospheres for a total of 60 seconds.  Followup angiogram, again, revealed no evidence of dissection or thrombus  with excellent distal flow.  IV integrilin was used throughout the case.  Intermediate doses of heparin were given to maintain the ACT between 200 and  300.   Final orthogonal angiograms revealed less than 10% residual stenosis in the  vein graft to the ramus with TIMI-3 flow to the distal vessel.  At this  point, we elected to conclude the procedure.  All balloons, wires and  catheters removed.  Hemostatic sheath was sewn in place and the patient  transferred back to the ward in stable condition.   CONCLUSION:  1. Successful placement of a Taxus 2.5 x 24-mm stent in the distal vein     graft to the ramus intermedius.  2. Significant three-vessel coronary artery disease.  3. Patent vein graft to the left anterior descending with large bridging     collaterals to the distal right coronary artery.  4. Totally occluded vein graft to obtuse marginal.  5. Totally occluded vein graft to right coronary artery.  6. Normal left ventricular systolic function.  7. Patent left subclavian.  8. Elevated LVEDP.                                               Darlin Priestly, M.D.    RHM/MEDQ  D:  04/28/2003  T:  05/01/2003  Job:  [id number]   cc:   Windle Guard, M.D.  [address]  [place], [place] [address]  Fax: [phone]

## 2010-05-31 NOTE — H&P (Signed)
NAME:  Frederick Kirby, Frederick Kirby                           ACCOUNT NO.:  [id number]   MEDICAL RECORD NO.:  [id number]                   PATIENT TYPE:  INP   LOCATION:  3033                                 FACILITY:  [facility]   PHYSICIAN:  Deanna Artis. Sharene Skeans, M.D.           DATE OF BIRTH:  [birth year]   DATE OF ADMISSION:  12/25/2002  DATE OF DISCHARGE:                                HISTORY & PHYSICAL   CHIEF COMPLAINT:  My left hand won't work.   HISTORY OF PRESENT ILLNESS:  A 75 year old right-handed married Caucasian  male with a greater than 70 pack year history of smoking, coronary artery  bypass graft x9 10 years ago, hypertension, and dyslipidemia of about 10  years duration.  The patient went to bed around 0200, awakened around 0430  with a clumsy left arm and hand, dysarthria, and gait disorder.  He arrived  at [facility] at [phone].  EDP saw him at 52.  Code stroke [phone].  CT  scan 0547, interpreted by me at 0600.  NIH stroke scale at time of  evaluation was 4, Rankin was 0.   PAST MEDICAL HISTORY:  1. As noted above.  2. Presbycusis.   REVIEW OF SYSTEMS:  The patient has been complaining of aching pain in his  right arm and neck over the past week, treated with one aspirin per day.  Twelve system review is otherwise negative except as noted in the history of  present illness.   PAST SURGICAL HISTORY:  Coronary artery bypass graft x9 in January 1994.   MEDICATIONS:  1. The patient takes a blood pressure pill.  He does not know what its name     is.  2. Recently, he took one aspirin per day for his muscle pain.   ALLERGIES:  No known drug allergies.   FAMILY HISTORY:  Father died at age 35 of black lung.  Mother died in her  55s of lung cancer to bone and brain.  The patient had a brother and a  sister succumb to lung cancer, and a brother died of a ruptured aortic  aneurysm.  He has four children who are healthy.   SOCIAL HISTORY:  The patient smokes two packs of  cigarettes per day since  his teenage years.  He rarely drinks alcohol.  He has had multiple jobs.  Currently, he works an active farm.  He had 12 years of school, but no  diploma.   PHYSICAL EXAMINATION:  VITAL SIGNS:  Blood pressure 193/98, resting pulse  88, respirations 20, pulse oximetry 96%, temperature was not obtained.  HEENT:  No bruits, no meningismus, no infection.  LUNGS:  Clear.  HEART:  No murmurs, pulses normal.  ABDOMEN:  Soft, bowel sounds normal.  No hepatosplenomegaly.  EXTREMITIES:  No edema, no cyanosis, normal pulses.  SKIN:  No lesions.  NEUROLOGIC:  The patient was awake, alert, normal orientation and language  and fair memory, as well as normal fund of knowledge.  Cranial nerves:  Round reactive pupils, 3 mm to 2 mm.  Fundi were normal.  Visual fields full  to double simultaneous stimuli.  Extraocular movements full.  He has the  inability to fully bury his eyelashes on the left, and mild lower facial  weakness, dysarthria.  Presbycusis.  Air conduction greater than bone  conduction still.  His tongue protrudes to the left.  Motor examination:  Mild left pronator drift, 5/5 on the right side and the left lower  extremity, 5/5 proximally in the left arm, wrist, and grip, but he cannot  oppose his thumb beyond his index finger and he has some difficulty  extending his fingers, particularly the fifth digit.  Sensory examination:  No hemi sensory, no peripheral neuropathy.  Cerebellar:  No tremor,  dysmetria, dystaxia, gait not tested.  Deep tendon reflexes brisk, left leg  more so than right leg.  He has two or three beats of ankle clonus.  His  toes are bilaterally flexor.   IMPRESSION:  I suspect this patient may have a brain stem stroke.  He has  upper and lower facial weakness which suggest a pontine lesion;  however, if  that was true, he should have right body hemiparesis rather than left.  So  it has to be rostral to the pons, but not by much.  This might  place it in  the mid brain, the cerebral peduncle of the mid brain.  This could be basal  ganglion with left side crossover from right to left for the superior face.  The fact that his tongue is also involved suggest to me that cortical bulbar  tracts are involved, as well as involvement of cortical spinal tracts.  His  risk factors for stroke include hypertension, dyslipidemia, and smoking.  He  has myalgias of unknown etiology.   PLAN:  Admit to the hospital under stroke service.  Normal saline, aspirin,  oxygen, and diagnostic workup, MRI/MRA intra and extracranial, 2-D  echocardiogram, carotid Doppler, transcutaneous Doppler, laboratory with  hemoglobin A1C, lipids, homocystine, sedimentation rate, CPK, in addition to  routine labs.  No blood pressure titration for now.  Smoking cessation.  We  will place a patch on him if he needs it for nicotine withdrawal.  We will  also involve the rehab unit soon.   I appreciate the opportunity to participate in his care.                                                Deanna Artis. Sharene Skeans, M.D.    [facility]  D:  12/25/2002  T:  12/25/2002  Job:  [id number]   cc:   Windle Guard, M.D.  [address]  [place], [place] [address]  Fax: [phone]

## 2010-11-23 ENCOUNTER — Encounter: Payer: Self-pay | Admitting: *Deleted

## 2010-11-23 ENCOUNTER — Other Ambulatory Visit: Payer: Self-pay

## 2010-11-23 ENCOUNTER — Emergency Department (HOSPITAL_COMMUNITY): Payer: Medicare Other

## 2010-11-23 ENCOUNTER — Emergency Department (HOSPITAL_COMMUNITY)
Admission: EM | Admit: 2010-11-23 | Discharge: 2010-11-23 | Disposition: A | Discharge status: Home / Self Care | Payer: Medicare Other | Attending: Emergency Medicine | Admitting: Emergency Medicine

## 2010-11-23 DIAGNOSIS — R0602 Shortness of breath: Secondary | ICD-10-CM | POA: Insufficient documentation

## 2010-11-23 DIAGNOSIS — R05 Cough: Secondary | ICD-10-CM

## 2010-11-23 DIAGNOSIS — R059 Cough, unspecified: Secondary | ICD-10-CM | POA: Insufficient documentation

## 2010-11-23 DIAGNOSIS — I2510 Atherosclerotic heart disease of native coronary artery without angina pectoris: Secondary | ICD-10-CM | POA: Insufficient documentation

## 2010-11-23 DIAGNOSIS — J441 Chronic obstructive pulmonary disease with (acute) exacerbation: Secondary | ICD-10-CM

## 2010-11-23 DIAGNOSIS — I252 Old myocardial infarction: Secondary | ICD-10-CM | POA: Insufficient documentation

## 2010-11-23 DIAGNOSIS — Z8679 Personal history of other diseases of the circulatory system: Secondary | ICD-10-CM | POA: Insufficient documentation

## 2010-11-23 DIAGNOSIS — I10 Essential (primary) hypertension: Secondary | ICD-10-CM | POA: Insufficient documentation

## 2010-11-23 DIAGNOSIS — J40 Bronchitis, not specified as acute or chronic: Secondary | ICD-10-CM

## 2010-11-23 HISTORY — DX: Essential (primary) hypertension: I10

## 2010-11-23 HISTORY — DX: Acute myocardial infarction, unspecified: I21.9

## 2010-11-23 HISTORY — DX: Chronic obstructive pulmonary disease, unspecified: J44.9

## 2010-11-23 HISTORY — DX: Atherosclerotic heart disease of native coronary artery without angina pectoris: I25.10

## 2010-11-23 HISTORY — DX: Cerebral infarction, unspecified: I63.9

## 2010-11-23 LAB — COMPREHENSIVE METABOLIC PANEL
BUN: 15 mg/dL (ref 6–23)
CO2: 23 mEq/L (ref 19–32)
Chloride: 102 mEq/L (ref 96–112)
Creatinine, Ser: 1.55 mg/dL — ABNORMAL HIGH (ref 0.50–1.35)
GFR calc Af Amer: 50 mL/min — ABNORMAL LOW (ref >90)
GFR calc non Af Amer: 43 mL/min — ABNORMAL LOW (ref >90)
Total Bilirubin: 0.3 mg/dL (ref 0.3–1.2)

## 2010-11-23 LAB — POCT I-STAT TROPONIN I: Troponin i, poc: 0 ng/mL (ref 0.00–0.08)

## 2010-11-23 LAB — CBC
HCT: 35.6 % — ABNORMAL LOW (ref 39.0–52.0)
MCV: 83.8 fL (ref 78.0–100.0)
RBC: 4.25 MIL/uL (ref 4.22–5.81)
RDW: 13.7 % (ref 11.5–15.5)
WBC: 6.5 10*3/uL (ref 4.0–10.5)

## 2010-11-23 MED ORDER — ALBUTEROL SULFATE (5 MG/ML) 0.5% IN NEBU
5.0000 mg | INHALATION_SOLUTION | Freq: Once | RESPIRATORY_TRACT | Status: AC
  Administered 2010-11-23: 5 mg via RESPIRATORY_TRACT
  Filled 2010-11-23: qty 1

## 2010-11-23 MED ORDER — PREDNISONE 20 MG PO TABS: 60.0000 mg | ORAL_TABLET | Freq: Every day | ORAL | Status: AC

## 2010-11-23 MED ORDER — IPRATROPIUM BROMIDE 0.02 % IN SOLN
0.5000 mg | Freq: Once | RESPIRATORY_TRACT | Status: AC
  Administered 2010-11-23: 0.5 mg via RESPIRATORY_TRACT
  Filled 2010-11-23: qty 2.5

## 2010-11-23 MED ORDER — SODIUM CHLORIDE 0.9 % IV SOLN
INTRAVENOUS | Status: DC
  Administered 2010-11-23: 09:00:00 via INTRAVENOUS

## 2010-11-23 MED ORDER — METHYLPREDNISOLONE SODIUM SUCC 125 MG IJ SOLR
125.0000 mg | Freq: Once | INTRAMUSCULAR | Status: AC
  Administered 2010-11-23: 125 mg via INTRAVENOUS
  Filled 2010-11-23: qty 2

## 2010-11-23 MED ORDER — MOXIFLOXACIN HCL 400 MG PO TABS
400.0000 mg | ORAL_TABLET | Freq: Once | ORAL | Status: AC
  Administered 2010-11-23: 400 mg via ORAL
  Filled 2010-11-23: qty 1

## 2010-11-23 MED ORDER — MOXIFLOXACIN HCL 400 MG PO TABS: 400.0000 mg | ORAL_TABLET | Freq: Every day | ORAL | Status: AC

## 2010-11-23 MED ORDER — ALBUTEROL SULFATE HFA 108 (90 BASE) MCG/ACT IN AERS: 2.0000 | INHALATION_SPRAY | RESPIRATORY_TRACT | Status: DC | PRN

## 2010-11-23 NOTE — ED Notes (Signed)
RT notified of needed neb tx (#3).  Pt assisted to restroom without difficulty.

## 2010-11-23 NOTE — ED Provider Notes (Signed)
History     CSN: [id number] Arrival date & time: 11/23/2010  7:27 AM   First MD Initiated Contact with Patient 11/23/10 [phone]      Chief Complaint  Patient presents with  . Shortness of Breath    (Consider location/radiation/quality/duration/timing/severity/associated sxs/prior treatment) Patient is a 75 y.o. male presenting with shortness of breath. The history is provided by the patient.  Shortness of Breath  The current episode started 5 to 7 days ago. The problem is moderate. The symptoms are relieved by nothing. Associated symptoms include shortness of breath. Pertinent negatives include no fever.  pt c/o productive cough and sob last week. States feels similar to prior dx pna. Just completed 1 week amox, no better. No fever or chills. Soreness right lateral chest when coughs, otherwise denies any chest pain or discomfort. No leg pain or swelling. No dvt or pe. No orthopnea or pnd. Denies hx chf. +hx cad/cabg. +hx copd. Pt notes wheezing. Denies regular neb or mdi use. No known ill contacts. No myalgias, sore throat, nasal congestion or other uri symptoms.   Past Medical History  Diagnosis Date  . Coronary artery disease   . Stroke   . Myocardial infarction   . Hypertension   . COPD (chronic obstructive pulmonary disease)     Past Surgical History  Procedure Date  . Coronary artery bypass graft     No family history on file.  History  Substance Use Topics  . Smoking status: Former Smoker    Quit date: 01/13/2005  . Smokeless tobacco: Not on file  . Alcohol Use: No      Review of Systems  Constitutional: Negative for fever.  HENT: Negative for neck pain.   Eyes: Negative for pain.  Respiratory: Positive for shortness of breath.   Cardiovascular: Negative for leg swelling.  Gastrointestinal: Negative for abdominal pain.  Genitourinary: Negative for dysuria.  Musculoskeletal: Negative for back pain.  Skin: Negative for rash.  Neurological: Negative for  headaches.  Hematological: Does not bruise/bleed easily.  Psychiatric/Behavioral: Negative for confusion.    Allergies  Review of patient's allergies indicates no known allergies.  Home Medications   Current Outpatient Rx  Name Route Sig Dispense Refill  . AMLODIPINE BESYLATE PO Oral Take by mouth.      . AMOXICILLIN 500 MG PO CAPS Oral Take 500 mg by mouth 3 (three) times daily.      Marland Kitchen PLAVIX PO Oral Take by mouth.      . EZETIMIBE 10 MG PO TABS Oral Take 10 mg by mouth daily.      . FOLBIC PO Oral Take by mouth.      Marland Kitchen LISINOPRIL PO Oral Take by mouth.      . METOPROLOL TARTRATE PO Oral Take by mouth.      . OMEPRAZOLE 40 MG PO CPDR Oral Take 40 mg by mouth daily.      . CRESTOR PO Oral Take by mouth.        BP 128/60  Pulse 72  Temp(Src) 97.5 F (36.4 C) (Oral)  Resp 18  SpO2 92%  Physical Exam  Nursing note and vitals reviewed. Constitutional: He is oriented to person, place, and time. He appears well-developed and well-nourished.  HENT:  Head: Atraumatic.  Mouth/Throat: Oropharynx is clear and moist.  Eyes: Pupils are equal, round, and reactive to light.  Neck: Neck supple. No tracheal deviation present.  Cardiovascular: Normal rate, regular rhythm, normal heart sounds and intact distal pulses.  Exam reveals no  gallop and no friction rub.   No murmur heard. Pulmonary/Chest: No accessory muscle usage. He has wheezes.       Wheezing bil. Rhonchi esp right  Abdominal: Soft. Bowel sounds are normal. He exhibits no distension and no mass. There is no tenderness. There is no rebound and no guarding.  Musculoskeletal: Normal range of motion. He exhibits no edema and no tenderness.  Neurological: He is alert and oriented to person, place, and time.  Skin: Skin is warm and dry. No rash noted.  Psychiatric: He has a normal mood and affect.    ED Course  Procedures (including critical care time)   Labs Reviewed  CBC  COMPREHENSIVE METABOLIC PANEL  I-STAT TROPONIN I     No results found.  Results for orders placed during the hospital encounter of 11/23/10  CBC      Component Value Range   WBC 6.5  4.0 - 10.5 (K/uL)   RBC 4.25  4.22 - 5.81 (MIL/uL)   Hemoglobin 11.9 (*) 13.0 - 17.0 (g/dL)   HCT 86.5 (*) 78.4 - 52.0 (%)   MCV 83.8  78.0 - 100.0 (fL)   MCH 28.0  26.0 - 34.0 (pg)   MCHC 33.4  30.0 - 36.0 (g/dL)   RDW 69.6  29.5 - 28.4 (%)   Platelets 256  150 - 400 (K/uL)  COMPREHENSIVE METABOLIC PANEL      Component Value Range   Sodium 137  135 - 145 (mEq/L)   Potassium 3.9  3.5 - 5.1 (mEq/L)   Chloride 102  96 - 112 (mEq/L)   CO2 23  19 - 32 (mEq/L)   Glucose, Bld 108 (*) 70 - 99 (mg/dL)   BUN 15  6 - 23 (mg/dL)   Creatinine, Ser 1.32 (*) 0.50 - 1.35 (mg/dL)   Calcium 9.5  8.4 - 44.0 (mg/dL)   Total Protein 7.6  6.0 - 8.3 (g/dL)   Albumin 3.7  3.5 - 5.2 (g/dL)   AST 21  0 - 37 (U/L)   ALT 17  0 - 53 (U/L)   Alkaline Phosphatase 109  39 - 117 (U/L)   Total Bilirubin 0.3  0.3 - 1.2 (mg/dL)   GFR calc non Af Amer 43 (*) >90 (mL/min)   GFR calc Af Amer 50 (*) >90 (mL/min)  POCT I-STAT TROPONIN I      Component Value Range   Troponin i, poc 0.00  0.00 - 0.08 (ng/mL)   Comment 3            Dg Chest 2 View  11/23/2010  *RADIOLOGY REPORT*  Clinical Data: Cough and chest pain  CHEST - 2 VIEW  Comparison: 01/11/2010  Findings: Prior median sternotomy and CABG procedure.  No pleural effusion or pulmonary edema.  Increased lung volumes and slightly coarsened interstitial markings are noted.  No airspace consolidation identified.  IMPRESSION:  1.  No acute cardiopulmonary abnormalities.  Original Report Authenticated By: Rosealee Albee, M.D.     No diagnosis found.    MDM  Nurses notes and vital signs reviewed. Reviewed prior records, labs, xrays.  Labs and xrays ordered today. Albuterol and atrovent neb tx.     Date: 11/23/2010  Rate: 64  Rhythm: normal sinus rhythm  QRS Axis: normal  Intervals: normal and first degree block  ST/T  Wave abnormalities: normal  Conduction Disutrbances:first-degree A-V block   Narrative Interpretation:   Old EKG Reviewed: unchanged   w neb txs improved air exchange, decreased wheezing. Pt feels  improved. Additional albuterol and atrovent neb txs givens. Will x bronchits and copd. Pt give iv solumedrol in ed along w additional alb neb.   Recheck no increased wob. No pain. Good air exchange.   Suzi Roots, MD 11/23/10 [phone]

## 2010-11-23 NOTE — ED Notes (Signed)
Pt is here with cough and short of breath.  Pt was put on abx by his md.  Pt was put on amoxicillin.  Pt fatigued and weak.  Pt reports wheezing

## 2010-11-23 NOTE — ED Notes (Signed)
RT notified of order for 2nd neb tx.

## 2011-11-09 IMAGING — CR DG CHEST 2V
2 series · 2 of 2 positions shown · non-contrast
Comparison: Chest x-ray of 09/10/2008

CLINICAL DATA: Cough, abdominal pain

CHEST - 2 VIEW

[w chest pa]
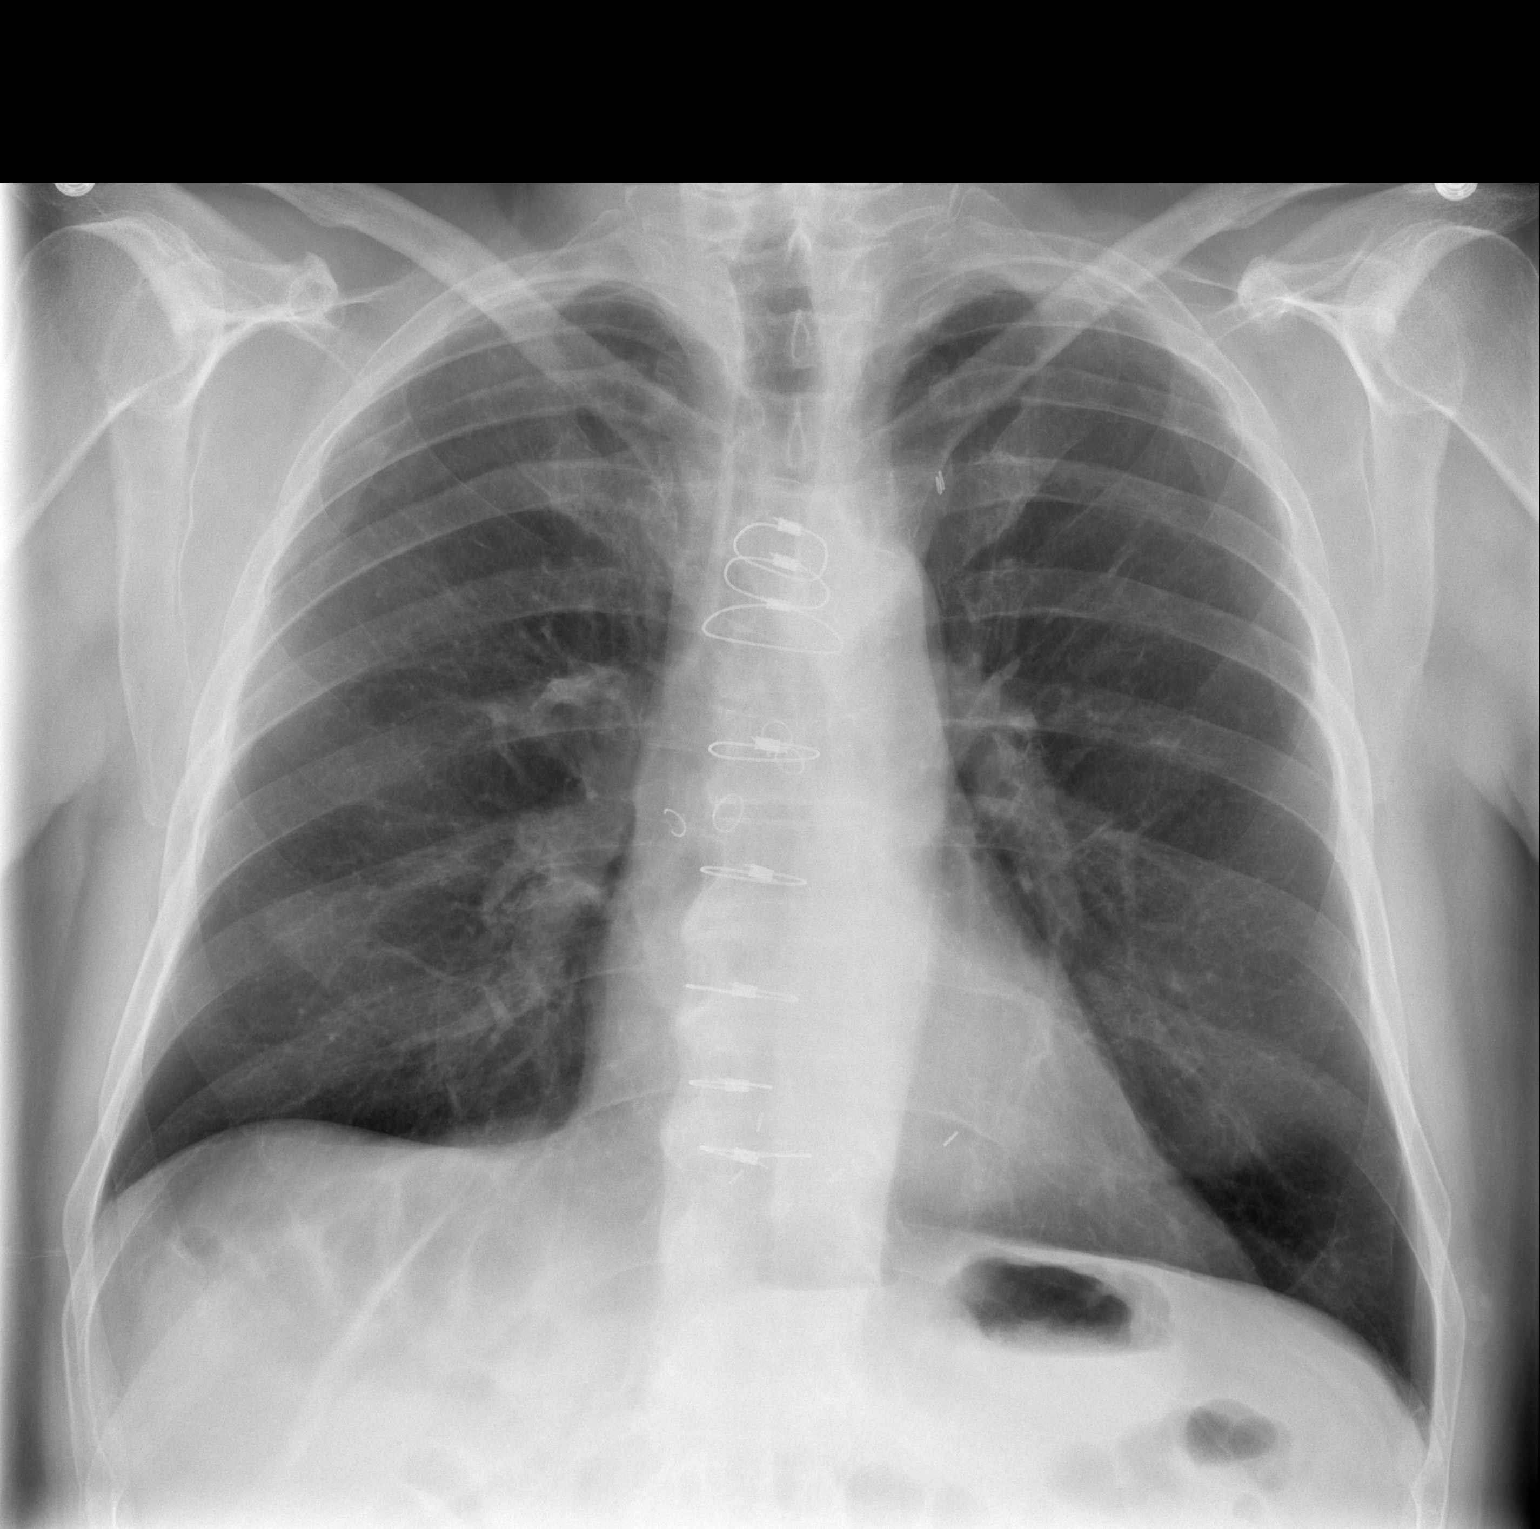

[w chest lat]
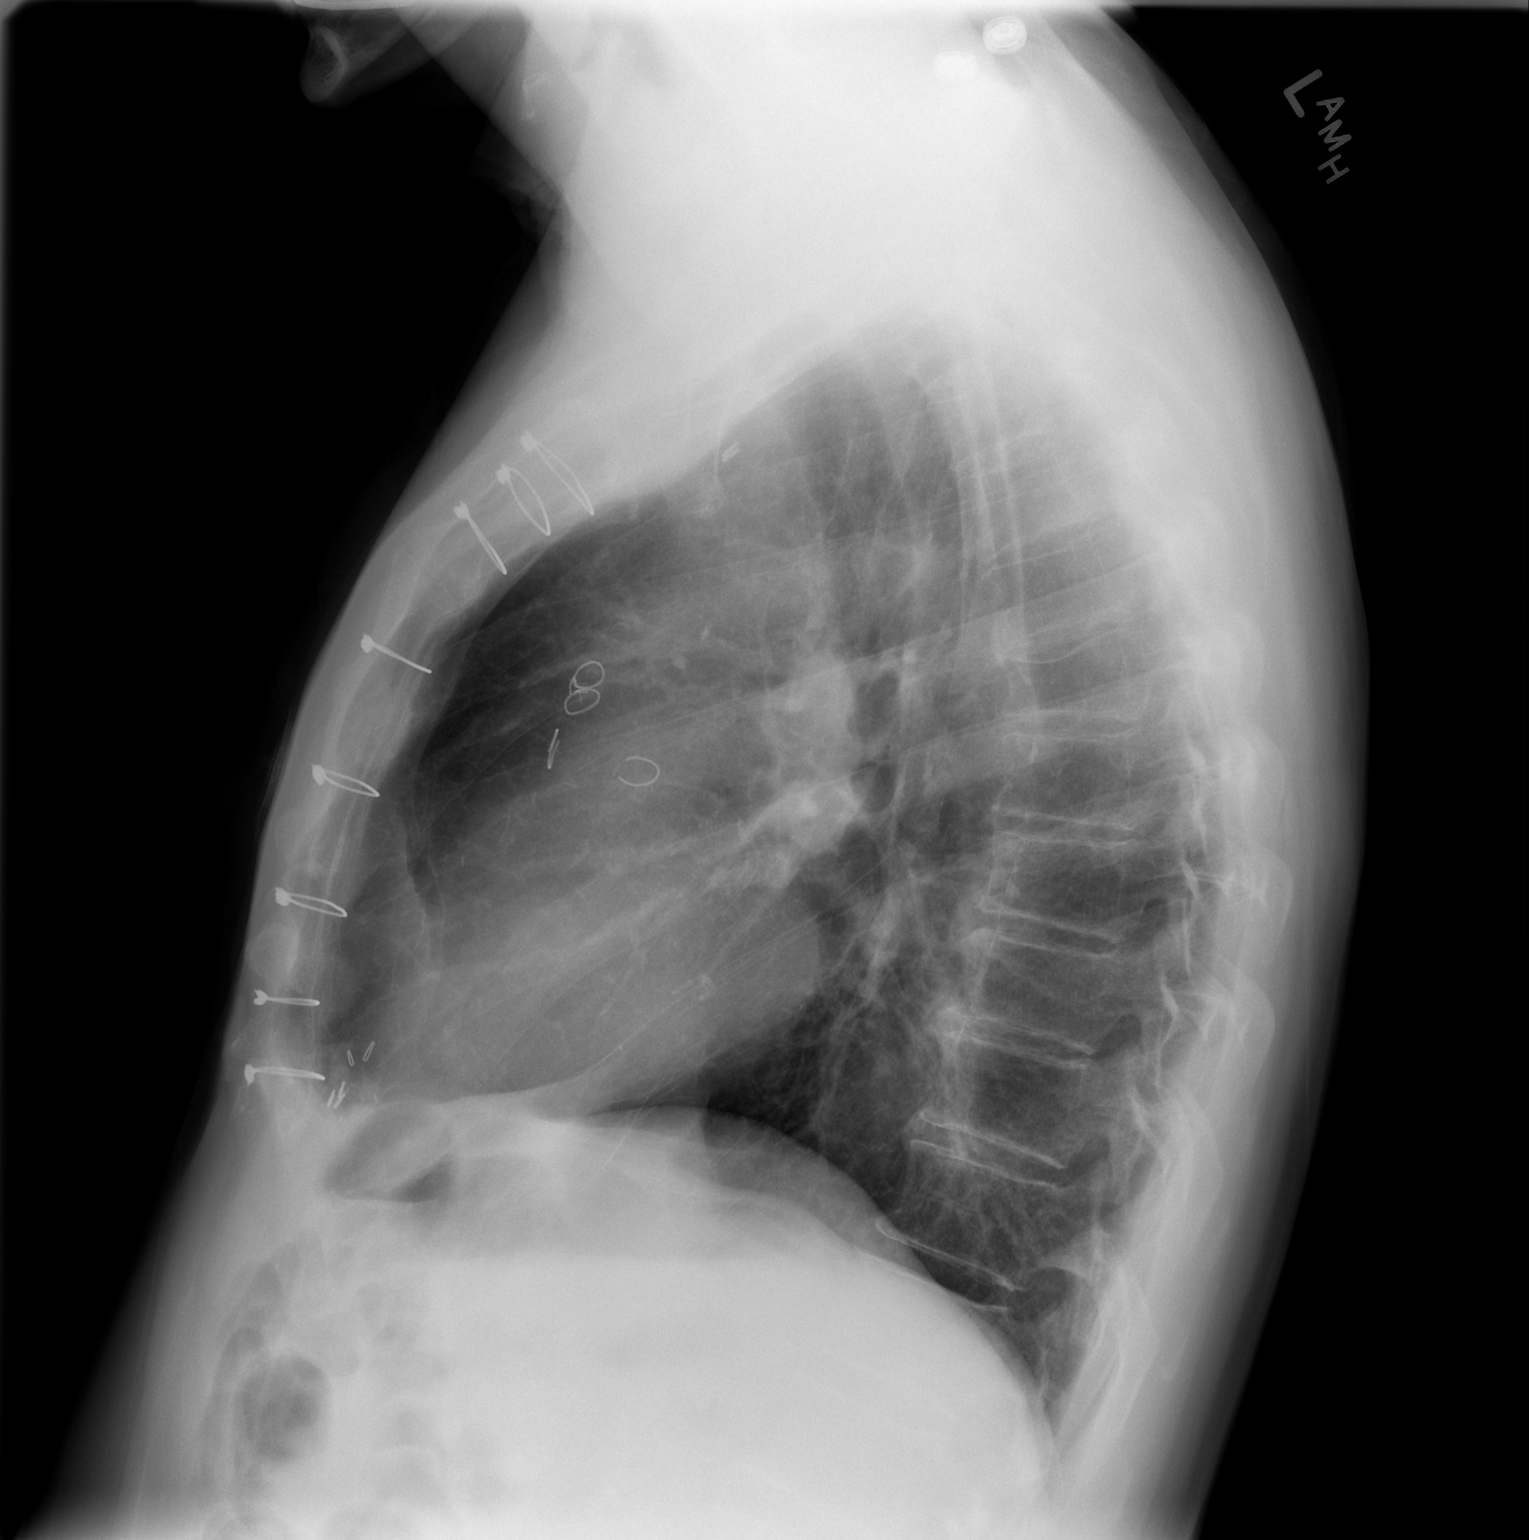

[2 of 2 positions shown; findings below may reference images not displayed]

FINDINGS: The lungs are clear and remain slightly hyperaerated.
Mediastinal contours are stable.  The heart is within normal limits
in size.  Median sternotomy sutures are noted.  A partially
compressed mid thoracic vertebral body appears stable.
IMPRESSION: No active lung disease.  No change in hyperaeration.

## 2011-11-14 HISTORY — PX: NM MYOCAR PERF WALL MOTION: HXRAD629

## 2011-12-14 HISTORY — PX: OTHER SURGICAL HISTORY: SHX169

## 2011-12-26 ENCOUNTER — Other Ambulatory Visit (HOSPITAL_COMMUNITY): Payer: Self-pay | Admitting: Cardiovascular Disease

## 2011-12-26 DIAGNOSIS — I723 Aneurysm of iliac artery: Secondary | ICD-10-CM

## 2012-01-13 ENCOUNTER — Ambulatory Visit (HOSPITAL_COMMUNITY)
Admission: RE | Admit: 2012-01-13 | Discharge: 2012-01-13 | Disposition: A | Discharge status: Home / Self Care | Payer: Medicare Other | Source: Ambulatory Visit | Attending: Cardiovascular Disease | Admitting: Cardiovascular Disease

## 2012-01-13 DIAGNOSIS — I723 Aneurysm of iliac artery: Secondary | ICD-10-CM

## 2012-01-13 NOTE — Progress Notes (Signed)
Renal duplex completed. Frederick Kirby  

## 2012-01-13 NOTE — Progress Notes (Signed)
Carotid duplex completed. Frederick Kirby  

## 2012-06-04 ENCOUNTER — Other Ambulatory Visit: Payer: Self-pay | Admitting: Cardiovascular Disease

## 2012-06-04 LAB — CBC
MCH: 26.8 pg (ref 26.0–34.0)
MCHC: 32.6 g/dL (ref 30.0–36.0)
MCV: 82.1 fL (ref 78.0–100.0)
Platelets: 269 10*3/uL (ref 150–400)
RBC: 3.92 MIL/uL — ABNORMAL LOW (ref 4.22–5.81)
RDW: 15.1 % (ref 11.5–15.5)

## 2012-06-04 LAB — LIPID PANEL
Cholesterol: 139 mg/dL (ref 0–200)
Total CHOL/HDL Ratio: 3.7 Ratio
VLDL: 27 mg/dL (ref 0–40)

## 2012-06-04 LAB — COMPREHENSIVE METABOLIC PANEL
ALT: 14 U/L (ref 0–53)
Alkaline Phosphatase: 78 U/L (ref 39–117)
CO2: 25 mEq/L (ref 19–32)
Creat: 1.7 mg/dL — ABNORMAL HIGH (ref 0.50–1.35)
Total Bilirubin: 0.4 mg/dL (ref 0.3–1.2)

## 2012-06-11 ENCOUNTER — Telehealth: Payer: Self-pay | Admitting: *Deleted

## 2012-06-11 NOTE — Telephone Encounter (Addendum)
Message copied by Gaynelle Cage on Fri Jun 11, 2012  2:15 PM ------      Message from: Nicki Guadalajara A      Created: Wed Jun 09, 2012  8:52 AM       Mild anemia; lipids OK ------LEFT MESSAGE AT PATIENTS HOME NUMBER. ABOVE MESSAGE PER DR. KELLY.

## 2012-07-01 ENCOUNTER — Telehealth: Payer: Self-pay | Admitting: Cardiovascular Disease

## 2012-07-01 MED ORDER — CLOPIDOGREL BISULFATE 75 MG PO TABS: 75.0000 mg | ORAL_TABLET | Freq: Every day | ORAL | Status: DC

## 2012-07-01 NOTE — Telephone Encounter (Signed)
Need new prescriptions for Omeprazole 40 mg and she said Dr Tresa Endo told her to call here instead of pharmacy!Clopidogrel 75mg -Please cal lto [phone]-

## 2012-07-01 NOTE — Telephone Encounter (Signed)
Returned call.  Left message to call back.  Refill for clopidogrel sent to pharmacy.  Notified K. Alvstad, PharmD of interaction and advised omeprazole be dc'd and pt start protonix.  Will inform pt/wife when call returned.  New Rx not sent.

## 2012-07-02 ENCOUNTER — Other Ambulatory Visit: Payer: Self-pay | Admitting: Cardiovascular Disease

## 2012-07-02 NOTE — Telephone Encounter (Signed)
Rx was sent to pharmacy electronically. 

## 2012-07-02 NOTE — Telephone Encounter (Signed)
Returned call.  Left message to call back before 4pm.  

## 2012-07-06 ENCOUNTER — Other Ambulatory Visit: Payer: Self-pay | Admitting: *Deleted

## 2012-07-06 MED ORDER — OMEPRAZOLE 40 MG PO CPDR: 40.0000 mg | DELAYED_RELEASE_CAPSULE | Freq: Every day | ORAL | Status: DC

## 2012-07-06 NOTE — Telephone Encounter (Signed)
Omeprazole refilled electronically

## 2012-07-07 NOTE — Progress Notes (Signed)
Quick Note:  Left message still anemic. Contact PCP. Lipids okay. ______

## 2012-08-05 ENCOUNTER — Other Ambulatory Visit: Payer: Self-pay | Admitting: Cardiovascular Disease

## 2012-09-23 ENCOUNTER — Other Ambulatory Visit: Payer: Self-pay | Admitting: Cardiovascular Disease

## 2012-10-18 ENCOUNTER — Other Ambulatory Visit: Payer: Self-pay

## 2012-10-18 MED ORDER — FA-PYRIDOXINE-CYANOCOBALAMIN 2.5-25-2 MG PO TABS: 1.0000 | ORAL_TABLET | Freq: Every day | ORAL | Status: DC

## 2012-10-18 NOTE — Telephone Encounter (Signed)
Rx was sent to pharmacy electronically. 

## 2012-12-03 ENCOUNTER — Telehealth (HOSPITAL_COMMUNITY): Payer: Self-pay | Admitting: *Deleted

## 2012-12-03 ENCOUNTER — Other Ambulatory Visit (HOSPITAL_COMMUNITY): Payer: Self-pay | Admitting: Cardiovascular Disease

## 2012-12-03 DIAGNOSIS — I723 Aneurysm of iliac artery: Secondary | ICD-10-CM

## 2012-12-07 ENCOUNTER — Ambulatory Visit (HOSPITAL_COMMUNITY)
Admission: RE | Admit: 2012-12-07 | Discharge: 2012-12-07 | Disposition: A | Discharge status: Home / Self Care | Payer: Medicare Other | Source: Ambulatory Visit | Attending: Cardiovascular Disease | Admitting: Cardiovascular Disease

## 2012-12-07 DIAGNOSIS — I6529 Occlusion and stenosis of unspecified carotid artery: Secondary | ICD-10-CM | POA: Insufficient documentation

## 2012-12-07 DIAGNOSIS — I723 Aneurysm of iliac artery: Secondary | ICD-10-CM

## 2012-12-07 NOTE — Progress Notes (Signed)
Carotid Duplex Completed. °Brianna L Mazza,RVT °

## 2012-12-14 ENCOUNTER — Ambulatory Visit (INDEPENDENT_AMBULATORY_CARE_PROVIDER_SITE_OTHER): Payer: Medicare Other | Admitting: Cardiovascular Disease

## 2012-12-14 ENCOUNTER — Encounter: Payer: Self-pay | Admitting: Cardiovascular Disease

## 2012-12-14 VITALS — BP 130/80 | HR 69 | Ht 68.5 in | Wt 184.7 lb

## 2012-12-14 DIAGNOSIS — I2510 Atherosclerotic heart disease of native coronary artery without angina pectoris: Secondary | ICD-10-CM

## 2012-12-14 DIAGNOSIS — R5383 Other fatigue: Secondary | ICD-10-CM

## 2012-12-14 DIAGNOSIS — E785 Hyperlipidemia, unspecified: Secondary | ICD-10-CM

## 2012-12-14 DIAGNOSIS — I10 Essential (primary) hypertension: Secondary | ICD-10-CM

## 2012-12-14 DIAGNOSIS — I723 Aneurysm of iliac artery: Secondary | ICD-10-CM

## 2012-12-14 DIAGNOSIS — I25810 Atherosclerosis of coronary artery bypass graft(s) without angina pectoris: Secondary | ICD-10-CM

## 2012-12-14 DIAGNOSIS — N289 Disorder of kidney and ureter, unspecified: Secondary | ICD-10-CM

## 2012-12-14 DIAGNOSIS — R5381 Other malaise: Secondary | ICD-10-CM

## 2012-12-14 NOTE — Patient Instructions (Signed)
Your physician recommends that you return for lab work fasting.  Your physician recommends that you schedule a follow-up appointment in:6 MONTHS. 

## 2012-12-16 ENCOUNTER — Encounter: Payer: Self-pay | Admitting: Cardiovascular Disease

## 2012-12-16 DIAGNOSIS — I25810 Atherosclerosis of coronary artery bypass graft(s) without angina pectoris: Secondary | ICD-10-CM | POA: Insufficient documentation

## 2012-12-16 DIAGNOSIS — E785 Hyperlipidemia, unspecified: Secondary | ICD-10-CM | POA: Insufficient documentation

## 2012-12-16 DIAGNOSIS — N189 Chronic kidney disease, unspecified: Secondary | ICD-10-CM | POA: Insufficient documentation

## 2012-12-16 DIAGNOSIS — I723 Aneurysm of iliac artery: Secondary | ICD-10-CM | POA: Insufficient documentation

## 2012-12-16 DIAGNOSIS — N184 Chronic kidney disease, stage 4 (severe): Secondary | ICD-10-CM | POA: Insufficient documentation

## 2012-12-16 DIAGNOSIS — N289 Disorder of kidney and ureter, unspecified: Secondary | ICD-10-CM | POA: Insufficient documentation

## 2012-12-16 DIAGNOSIS — I10 Essential (primary) hypertension: Secondary | ICD-10-CM | POA: Insufficient documentation

## 2012-12-16 DIAGNOSIS — I2510 Atherosclerotic heart disease of native coronary artery without angina pectoris: Secondary | ICD-10-CM | POA: Insufficient documentation

## 2012-12-16 NOTE — Progress Notes (Signed)
Patient ID: Frederick Kirby, male   DOB: 1935-06-23, 77 y.o.   MRN: [id number]     HPI: Frederick Kirby is a 77 y.o. male who presents for a 10 month cardiology evaluation.  Frederick Kirby has a history of coronary as well as peripheral vascular disease. In 1994 he underwent CABG revascularization surgery LIMA to the LAD, sequential vein to the OM 102, sequential vein to the PDA and PLA vessel. In April 2005, a 2.5x24 mm Taxus stent was inserted into the vein graft supplying the ramus intermediate vessel. At that time, his RCA graft was occluded and he also had an occluded graft to the marginal vessel but had a patent LIMA to the LAD. Additional problems include mixed hyperlipidemia, documented iliac artery aneurysm, mild renal insufficiency, hypertension, as well as hyperlipidemia. He's also had instances with stage II hypertension requiring medication adjustment. When I last saw him, because of a creatinine which had risen to 1.98 I decreased his lisinopril from 20 mg to 10 mg and further titrate his metoprolol extended release 75 mg. He subsequently met did show improvement in renal function. He presents now for evaluation. He denies recent chest pain. He denies tachycardia palpitations. At times he does note some mild indigestion.  Past Medical History  Diagnosis Date  . Coronary artery disease   . Stroke   . Myocardial infarction   . Hypertension   . COPD (chronic obstructive pulmonary disease)     Past Surgical History  Procedure Laterality Date  . Coronary artery bypass graft      Allergies  Allergen Reactions  . Niaspan [Niacin Er]     Turned red & his skin burned    Current Outpatient Prescriptions  Medication Sig Dispense Refill  . amLODipine (NORVASC) 10 MG tablet TAKE 1 TABLET BY MOUTH EVERY DAY  30 tablet  6  . clopidogrel (PLAVIX) 75 MG tablet Take 1 tablet (75 mg total) by mouth daily.  30 tablet  6  . folic acid-pyridoxine-cyancobalamin (FOLBIC) 2.5-25-2 MG TABS Take 1 tablet by  mouth daily.  30 each  4  . lisinopril (PRINIVIL,ZESTRIL) 20 MG tablet Take 20 mg by mouth daily. Patient takes 1/2 tablet      . metoprolol succinate (TOPROL-XL) 50 MG 24 hr tablet TAKE 1 AND 1/2 TABLET DAILY  45 tablet  5  . Omega-3 Fatty Acids (FISH OIL) 1200 MG CAPS Take 1 capsule by mouth 2 (two) times daily.      Marland Kitchen omeprazole (PRILOSEC) 40 MG capsule Take 1 capsule (40 mg total) by mouth daily.  30 capsule  6  . rosuvastatin (CRESTOR) 40 MG tablet Take 40 mg by mouth daily.      Marland Kitchen albuterol (PROVENTIL HFA;VENTOLIN HFA) 108 (90 BASE) MCG/ACT inhaler Inhale 2 puffs into the lungs every 4 (four) hours as needed for wheezing.  1 Inhaler  3   No current facility-administered medications for this visit.    History   Social History  . Marital Status: Single    Spouse Name: N/A    Number of Children: N/A  . Years of Education: N/A   Occupational History  . Not on file.   Social History Main Topics  . Smoking status: Former Smoker    Quit date: 01/13/2005  . Smokeless tobacco: Not on file  . Alcohol Use: No  . Drug Use: No  . Sexual Activity: Not on file   Other Topics Concern  . Not on file   Social History Narrative  .  No narrative on file   Social history is notable that he is married. He is over 18 grandchildren and at least 7 great grandchildren. He quit smoking 8 years ago. He remains active the  History reviewed. No pertinent family history.  ROS is negative for fevers, chills or night sweats. He denies skin rash. He denies visual changes. He denies change in hearing. He is unaware of lymphadenopathy. He denies wheezing. He denies cough or increased sputum production. He denies presyncope or syncope. He denies recent angina symptoms. At times he does note some mild stomachaches. He denies blood in his stool or urine. He denies GU symptoms. He denies claudication. He denies significant myalgias. He denies tremor. He denies bleeding. There is no diabetes. He denies cold  intolerance.   Other comprehensive 12 point system review is negative.  PE BP 130/80  Pulse 69  Ht 5' 8.5" (1.74 m)  Wt 83.779 kg (184 lb 11.2 oz)  BMI 27.67 kg/m2  General: Alert, oriented, no distress.  Skin: normal turgor, no rashes HEENT: Normocephalic, atraumatic. Pupils round and reactive; sclera anicteric;no lid lag.  Nose without nasal septal hypertrophy Mouth/Parynx benign; Mallinpatti scale 3 Neck: No JVD, soft right carotid bruit Lungs: clear to ausculatation and percussion; no wheezing or rales Heart: RRR, s1 s2 normal over 6 systolic murmur Abdomen: soft, nontender; no hepatosplenomehaly, BS+; abdominal aorta nontender and not dilated by palpation. Pulses 2+ Extremities: no clubbing cyanosis or edema, Homan's sign negative  Neurologic: grossly nonfocal Psychologic: normal affect and mood.  ECG: Normal sinus rhythm at 69 beats per minute. First degree AV block with PR interval 236 ms. QTc interval normal at 420 ms.  LABS:  BMET    Component Value Date/Time   NA 137 06/04/2012 0820   K 4.6 06/04/2012 0820   CL 103 06/04/2012 0820   CO2 25 06/04/2012 0820   GLUCOSE 103* 06/04/2012 0820   BUN 15 06/04/2012 0820   CREATININE 1.70* 06/04/2012 0820   CREATININE 1.55* 11/23/2010 0818   CALCIUM 9.1 06/04/2012 0820   GFRNONAA 43* 11/23/2010 0818   GFRAA 50* 11/23/2010 0818     Hepatic Function Panel     Component Value Date/Time   PROT 6.8 06/04/2012 0820   ALBUMIN 4.1 06/04/2012 0820   AST 20 06/04/2012 0820   ALT 14 06/04/2012 0820   ALKPHOS 78 06/04/2012 0820   BILITOT 0.4 06/04/2012 0820     CBC    Component Value Date/Time   WBC 5.1 06/04/2012 0820   RBC 3.92* 06/04/2012 0820   RBC 2.90* 12/15/2009 1226   HGB 10.5* 06/04/2012 0820   HCT 32.2* 06/04/2012 0820   PLT 269 06/04/2012 0820   MCV 82.1 06/04/2012 0820   MCH 26.8 06/04/2012 0820   MCHC 32.6 06/04/2012 0820   RDW 15.1 06/04/2012 0820   LYMPHSABS 1.6 12/15/2009 1155   MONOABS 0.9 12/15/2009 1155   EOSABS 0.0  12/15/2009 1155   BASOSABS 0.0 12/15/2009 1155     BNP No results found for this basename: probnp    Lipid Panel     Component Value Date/Time   CHOL 139 06/04/2012 0820   TRIG 134 06/04/2012 0820   HDL 38* 06/04/2012 0820   CHOLHDL 3.7 06/04/2012 0820   VLDL 27 06/04/2012 0820   LDLCALC 74 06/04/2012 0820     RADIOLOGY: No results found.    ASSESSMENT AND PLAN: Frederick Kirby has established coronary artery disease and is now 20 years status post CBG revascularization surgery  with LIMA to his LAD, sequential graft to the OM1 and O2, and sequential graft to the PDA and PLA vessel. Presently, he seems to be doing well on his current medical regimen the lipid studies are excellent with an LDL cholesterol of 74, although HDL cholesterol remains slightly low at 38. His creatinine has improved to 1.55, down from 1.98 previously. He's not having any anginal symptoms. There no signs of CHF. Continue current therapy. Laboratory will be obtained in the fasting state. I will see him in 6 months for cardiology reevaluation.    Lennette Bihari, MD, [facility]  12/16/2012 1:49 PM

## 2012-12-20 ENCOUNTER — Encounter: Payer: Self-pay | Admitting: Cardiovascular Disease

## 2013-02-08 ENCOUNTER — Other Ambulatory Visit: Payer: Self-pay | Admitting: *Deleted

## 2013-02-08 ENCOUNTER — Telehealth: Payer: Self-pay | Admitting: *Deleted

## 2013-02-08 MED ORDER — CLOPIDOGREL BISULFATE 75 MG PO TABS: 75.0000 mg | ORAL_TABLET | Freq: Every day | ORAL | Status: DC

## 2013-02-08 MED ORDER — AMLODIPINE BESYLATE 10 MG PO TABS: 10.0000 mg | ORAL_TABLET | Freq: Every day | ORAL | Status: DC

## 2013-02-08 NOTE — Telephone Encounter (Signed)
Pt was calling bc he is out of Plavix and the pharmacy has sent [place] a request and he stated they have not received anything from [place] yet.   [facility]  TK

## 2013-02-08 NOTE — Telephone Encounter (Signed)
Returned call and pt verified x 2.  Pt stated his wife called for a refill on the generic for Plavix yesterday and the pharmacy told him they haven't heard back from the office.  Pt informed no refill request received, but RN will send in refill.  Pt also needs refill on amlodipine.  Informed will send both.  Refill(s) sent to pharmacy.  Pt verbalized understanding and agreed w/ plan.

## 2013-02-09 ENCOUNTER — Other Ambulatory Visit: Payer: Self-pay | Admitting: *Deleted

## 2013-03-23 ENCOUNTER — Other Ambulatory Visit: Payer: Self-pay

## 2013-03-23 MED ORDER — LISINOPRIL 20 MG PO TABS: 10.0000 mg | ORAL_TABLET | Freq: Every day | ORAL | Status: DC

## 2013-03-23 NOTE — Telephone Encounter (Signed)
Rx was sent to pharmacy electronically. 

## 2013-03-24 ENCOUNTER — Telehealth (HOSPITAL_COMMUNITY): Payer: Self-pay | Admitting: *Deleted

## 2013-03-25 ENCOUNTER — Other Ambulatory Visit: Payer: Self-pay | Admitting: *Deleted

## 2013-03-25 ENCOUNTER — Other Ambulatory Visit (HOSPITAL_COMMUNITY): Payer: Self-pay | Admitting: Cardiovascular Disease

## 2013-03-25 DIAGNOSIS — I723 Aneurysm of iliac artery: Secondary | ICD-10-CM

## 2013-03-25 MED ORDER — ROSUVASTATIN CALCIUM 40 MG PO TABS: 40.0000 mg | ORAL_TABLET | Freq: Every day | ORAL | Status: DC

## 2013-03-25 NOTE — Telephone Encounter (Signed)
Rx was sent to pharmacy electronically. 

## 2013-03-28 ENCOUNTER — Other Ambulatory Visit: Payer: Self-pay | Admitting: *Deleted

## 2013-03-28 MED ORDER — METOPROLOL SUCCINATE ER 50 MG PO TB24: ORAL_TABLET | ORAL | Status: DC

## 2013-03-28 NOTE — Telephone Encounter (Signed)
Rx was sent to pharmacy electronically. 

## 2013-03-30 ENCOUNTER — Ambulatory Visit (HOSPITAL_COMMUNITY)
Admission: RE | Admit: 2013-03-30 | Discharge: 2013-03-30 | Disposition: A | Discharge status: Home / Self Care | Payer: Medicare HMO | Source: Ambulatory Visit | Attending: Cardiovascular Disease | Admitting: Cardiovascular Disease

## 2013-03-30 DIAGNOSIS — I723 Aneurysm of iliac artery: Secondary | ICD-10-CM | POA: Insufficient documentation

## 2013-03-30 LAB — CBC
HCT: 38.9 % — ABNORMAL LOW (ref 39.0–52.0)
Hemoglobin: 13.4 g/dL (ref 13.0–17.0)
MCH: 30.6 pg (ref 26.0–34.0)
MCHC: 34.4 g/dL (ref 30.0–36.0)
MCV: 88.8 fL (ref 78.0–100.0)
Platelets: 282 10*3/uL (ref 150–400)
RBC: 4.38 MIL/uL (ref 4.22–5.81)
RDW: 12.9 % (ref 11.5–15.5)
WBC: 7.2 10*3/uL (ref 4.0–10.5)

## 2013-03-30 LAB — LIPID PANEL
Cholesterol: 131 mg/dL (ref 0–200)
HDL: 37 mg/dL — ABNORMAL LOW (ref >39)
LDL Cholesterol: 63 mg/dL (ref 0–99)
Total CHOL/HDL Ratio: 3.5 Ratio
Triglycerides: 153 mg/dL — ABNORMAL HIGH (ref <150)
VLDL: 31 mg/dL (ref 0–40)

## 2013-03-30 LAB — TSH: TSH: 4.531 u[IU]/mL — ABNORMAL HIGH (ref 0.350–4.500)

## 2013-03-30 LAB — COMPREHENSIVE METABOLIC PANEL
ALT: 33 U/L (ref 0–53)
AST: 23 U/L (ref 0–37)
Albumin: 4.3 g/dL (ref 3.5–5.2)
Alkaline Phosphatase: 89 U/L (ref 39–117)
BUN: 16 mg/dL (ref 6–23)
CO2: 27 mEq/L (ref 19–32)
Calcium: 9.6 mg/dL (ref 8.4–10.5)
Chloride: 97 mEq/L (ref 96–112)
Creat: 1.67 mg/dL — ABNORMAL HIGH (ref 0.50–1.35)
Glucose, Bld: 108 mg/dL — ABNORMAL HIGH (ref 70–99)
Potassium: 4.3 mEq/L (ref 3.5–5.3)
Sodium: 133 mEq/L — ABNORMAL LOW (ref 135–145)
Total Bilirubin: 0.5 mg/dL (ref 0.2–1.2)
Total Protein: 7 g/dL (ref 6.0–8.3)

## 2013-03-30 NOTE — Progress Notes (Signed)
Abdominal Aortic Duplex Completed °Brianna L Mazza,RVT °

## 2013-04-07 ENCOUNTER — Other Ambulatory Visit: Payer: Self-pay

## 2013-04-07 MED ORDER — FA-PYRIDOXINE-CYANOCOBALAMIN 2.5-25-2 MG PO TABS: 1.0000 | ORAL_TABLET | Freq: Every day | ORAL | Status: DC

## 2013-04-07 NOTE — Telephone Encounter (Signed)
Rx was sent to pharmacy electronically. 

## 2013-04-08 ENCOUNTER — Other Ambulatory Visit: Payer: Self-pay | Admitting: *Deleted

## 2013-04-08 MED ORDER — FA-PYRIDOXINE-CYANOCOBALAMIN 2.5-25-2 MG PO TABS: 1.0000 | ORAL_TABLET | Freq: Every day | ORAL | Status: DC

## 2013-04-08 NOTE — Telephone Encounter (Signed)
folbic refilled w/#30 and 5 refills.

## 2013-05-04 ENCOUNTER — Telehealth: Payer: Self-pay | Admitting: *Deleted

## 2013-05-04 DIAGNOSIS — Z79899 Other long term (current) drug therapy: Secondary | ICD-10-CM

## 2013-05-04 NOTE — Telephone Encounter (Signed)
Called patient and gave lab results and recommendations per Dr. Claiborne Billings lab order sent to patient.

## 2013-05-11 ENCOUNTER — Encounter: Payer: Self-pay | Admitting: *Deleted

## 2013-05-26 ENCOUNTER — Encounter: Payer: Self-pay | Admitting: *Deleted

## 2013-05-30 ENCOUNTER — Encounter: Payer: Self-pay | Admitting: Cardiovascular Disease

## 2013-05-30 ENCOUNTER — Ambulatory Visit (INDEPENDENT_AMBULATORY_CARE_PROVIDER_SITE_OTHER): Payer: Medicare HMO | Admitting: Cardiovascular Disease

## 2013-05-30 VITALS — BP 118/64 | HR 62 | Ht 69.0 in | Wt 185.2 lb

## 2013-05-30 DIAGNOSIS — I723 Aneurysm of iliac artery: Secondary | ICD-10-CM

## 2013-05-30 DIAGNOSIS — N289 Disorder of kidney and ureter, unspecified: Secondary | ICD-10-CM

## 2013-05-30 DIAGNOSIS — I2510 Atherosclerotic heart disease of native coronary artery without angina pectoris: Secondary | ICD-10-CM

## 2013-05-30 DIAGNOSIS — I10 Essential (primary) hypertension: Secondary | ICD-10-CM

## 2013-05-30 DIAGNOSIS — I25810 Atherosclerosis of coronary artery bypass graft(s) without angina pectoris: Secondary | ICD-10-CM

## 2013-05-30 DIAGNOSIS — E785 Hyperlipidemia, unspecified: Secondary | ICD-10-CM

## 2013-05-30 LAB — BASIC METABOLIC PANEL
BUN: 14 mg/dL (ref 6–23)
CO2: 25 mEq/L (ref 19–32)
Calcium: 9.5 mg/dL (ref 8.4–10.5)
Chloride: 99 mEq/L (ref 96–112)
Creat: 1.59 mg/dL — ABNORMAL HIGH (ref 0.50–1.35)
Glucose, Bld: 98 mg/dL (ref 70–99)
POTASSIUM: 4.2 meq/L (ref 3.5–5.3)
Sodium: 135 mEq/L (ref 135–145)

## 2013-05-30 NOTE — Progress Notes (Signed)
Patient ID: Frederick Kirby, male   DOB: February 28, 1935, 78 y.o.   MRN: [id number]     HPI: Frederick Kirby is a 78 y.o. male who presents for a 6 month cardiology evaluation.  Frederick Kirby has a history of coronary as well as peripheral vascular disease. In 1994 he underwent CABG revascularization surgery LIMA to the LAD, sequential vein to the OM1 and OM2, sequential vein to the PDA and PLA vessel. In April 2005, a 2.5x24 mm Taxus stent was inserted into the vein graft supplying the ramus intermediate vessel. At that time, his RCA graft was occluded and he also had an occluded graft to the marginal vessel but had a patent LIMA to the LAD.  Additional problems include mixed hyperlipidemia, documented iliac artery aneurysm, mild renal insufficiency, hypertension, as well as hyperlipidemia. He's also had instances with stage II hypertension requiring medication adjustment. When I last saw him, because of a creatinine which had risen to 1.98 I decreased his lisinopril from 20 mg to 10 mg and further titrate his metoprolol extended release 75 mg. He subsequently met did show improvement in renal function.  Subsequent blood work in March creatinine of 1.67.  Serum sodium was mildly low at 33.  Lipid studies are notable for cholesterol 131, triglycerides 153, HDL 47, LDL 63. In March 2015 abdominal aortic ultrasound demonstrated  mild amount of nonhemodynamically significant plaque.  There also is mild aneurysmal dilatation of his right common iliac artery , which measured 2.0 x 2.2 cm, unchanged from two years previously. Over the past 6 months, he continues to be active.  He denies chest pain.  Denies palpitations.  Denies presyncope or syncope.  At times.  He does admit to some very mild left ankle edema.  Past Medical History  Diagnosis Date  . Coronary artery disease   . Stroke   . Myocardial infarction   . Hypertension   . COPD (chronic obstructive pulmonary disease)   . S/P CABG (coronary artery bypass  graft) 1994  . Hyperlipidemia   . Mild renal insufficiency     Past Surgical History  Procedure Laterality Date  . Coronary artery bypass graft  01/1992    LIMA-LAD & diagonal, SVG to diagonal, SVG to OM1 & OM2, VG to PDA & PLA (Dr. Redmond Pulling)  . Transthoracic echocardiogram  10/2008    EF 50-55%, borderline conc LVH, mild posterior wall hypokinesis; mild mitral annular calcification, prolapse of anterior leaflets, mild MR; RVSP 30-1mmHg; mild TR; mild calcification of AV leaflets  . Nm myocar perf wall motion  11/2011    lexiscan - normal study, EF 80% post-stress  . Coronary angioplasty with stent placement  04/28/2003    L Cfx with 99% prox lesion in AV groove; patent SVG to ramus intermedius w/ 95% distal lesion; SVG to OM totally occluded; SVG to RCA totally occluded; native RCA occluded in prox region w/collaterals from LAD into PDA; Taxus 2.5x48mm stent to distal VG to ramus (Dr. Gerrie Nordmann)  . Carotid doppler  12/2011    Right Bulb/Prox ICA 50-69% diameter reduction; Left Bulb/Prox ICA 0-49% diameter reduction  . Renal doppler  12/2011    Right CIA with aneursymal dilatation 2.3x3.1    Allergies  Allergen Reactions  . Niaspan [Niacin Er]     Turned red & his skin burned    Current Outpatient Prescriptions  Medication Sig Dispense Refill  . amLODipine (NORVASC) 10 MG tablet Take 1 tablet (10 mg total) by mouth daily.  30 tablet  5  . clopidogrel (PLAVIX) 75 MG tablet Take 1 tablet (75 mg total) by mouth daily.  30 tablet  5  . folic acid-pyridoxine-cyancobalamin (FOLBIC) 2.5-25-2 MG TABS Take 1 tablet by mouth daily.  30 each  5  . lisinopril (PRINIVIL,ZESTRIL) 20 MG tablet Take 0.5 tablets (10 mg total) by mouth daily.  30 tablet  4  . metoprolol succinate (TOPROL-XL) 50 MG 24 hr tablet TAKE 1 AND 1/2 TABLET BY MOUTH DAILY  45 tablet  7  . Omega-3 Fatty Acids (FISH OIL) 1200 MG CAPS Take 1 capsule by mouth 2 (two) times daily.      Marland Kitchen omeprazole (PRILOSEC) 40 MG capsule Take 1  capsule (40 mg total) by mouth daily.  30 capsule  6  . rosuvastatin (CRESTOR) 40 MG tablet Take 1 tablet (40 mg total) by mouth daily.  30 tablet  2   No current facility-administered medications for this visit.    History   Social History  . Marital Status: Single    Spouse Name: N/A    Number of Children: N/A  . Years of Education: N/A   Occupational History  . Not on file.   Social History Main Topics  . Smoking status: Former Smoker    Quit date: 01/13/2005  . Smokeless tobacco: Not on file  . Alcohol Use: No  . Drug Use: No  . Sexual Activity: Not on file   Other Topics Concern  . Not on file   Social History Narrative  . No narrative on file   Social history is notable that he is married. He is over 18 grandchildren and at least 7 great grandchildren. He quit smoking 8 years ago. He remains active the  History reviewed. No pertinent family history.  ROS General: Negative; No fevers, chills, or night sweats;  HEENT: Negative; No changes in vision or hearing, sinus congestion, difficulty swallowing Pulmonary: Negative; No cough, wheezing, shortness of breath, hemoptysis Cardiovascular: see HPI; No chest pain, presyncope, syncope, palpatations; occasional left ankle swelling GI: Negative; No nausea, vomiting, diarrhea, or abdominal pain GU: Negative; No dysuria, hematuria, or difficulty voiding Musculoskeletal: Negative; no myalgias, joint pain, or weakness Hematologic/Oncology: Negative; no easy bruising, bleeding Endocrine: Negative; no heat/cold intolerance; no diabetes Neuro: Negative; no changes in balance, headaches Skin: Negative; No rashes or skin lesions Psychiatric: Negative; No behavioral problems, depression Sleep: Negative; No snoring, daytime sleepiness, hypersomnolence, bruxism, restless legs, hypnogognic hallucinations, no cataplexy Other comprehensive 14 point system review is negative.  PE BP 118/64  Pulse 62  Ht 5\' 9"  (1.753 m)  Wt 185 lb  3.2 oz (84.006 kg)  BMI 27.34 kg/m2  General: Alert, oriented, no distress.  Skin: normal turgor, no rashes HEENT: Normocephalic, atraumatic. Pupils round and reactive; sclera anicteric;no lid lag.  Nose without nasal septal hypertrophy Mouth/Parynx benign; Mallinpatti scale 3 Neck: No JVD, soft right carotid bruit with normal carotid upstroke Lungs: clear to ausculatation and percussion; no wheezing or rales Chest wall: Nontender to palpation Heart: RRR, s1 s2 normal  1/6 systolic murmur; no S3 or S4 gallop.  No diastolic murmur.  No rubs, thrills or heaves. Abdomen: soft, nontender; no hepatosplenomehaly, BS+; abdominal aorta nontender and not dilated by palpation. Back: No CVA tenderness Pulses 2+ Extremities: no clubbing cyanosis or edema, Homan's sign negative  Neurologic: grossly nonfocal Psychologic: normal affect and mood.  ECG (independently read by me): Normal sinus rhythm at 62 beats per minute.  First degree AV block.  Prior ECG: Normal sinus rhythm  at 69 beats per minute. First degree AV block with PR interval 236 ms. QTc interval normal at 420 ms.  LABS:  BMET    Component Value Date/Time   NA 133* 03/30/2013 0940   K 4.3 03/30/2013 0940   CL 97 03/30/2013 0940   CO2 27 03/30/2013 0940   GLUCOSE 108* 03/30/2013 0940   BUN 16 03/30/2013 0940   CREATININE 1.67* 03/30/2013 0940   CREATININE 1.55* 11/23/2010 0818   CALCIUM 9.6 03/30/2013 0940   GFRNONAA 43* 11/23/2010 0818   GFRAA 50* 11/23/2010 0818     Hepatic Function Panel     Component Value Date/Time   PROT 7.0 03/30/2013 0940   ALBUMIN 4.3 03/30/2013 0940   AST 23 03/30/2013 0940   ALT 33 03/30/2013 0940   ALKPHOS 89 03/30/2013 0940   BILITOT 0.5 03/30/2013 0940     CBC    Component Value Date/Time   WBC 7.2 03/30/2013 0940   RBC 4.38 03/30/2013 0940   RBC 2.90* 12/15/2009 1226   HGB 13.4 03/30/2013 0940   HCT 38.9* 03/30/2013 0940   PLT 282 03/30/2013 0940   MCV 88.8 03/30/2013 0940   MCH 30.6 03/30/2013  0940   MCHC 34.4 03/30/2013 0940   RDW 12.9 03/30/2013 0940   LYMPHSABS 1.6 12/15/2009 1155   MONOABS 0.9 12/15/2009 1155   EOSABS 0.0 12/15/2009 1155   BASOSABS 0.0 12/15/2009 1155     BNP No results found for this basename: probnp    Lipid Panel     Component Value Date/Time   CHOL 131 03/30/2013 0940   TRIG 153* 03/30/2013 0940   HDL 37* 03/30/2013 0940   CHOLHDL 3.5 03/30/2013 0940   VLDL 31 03/30/2013 0940   LDLCALC 63 03/30/2013 0940     RADIOLOGY: No results found.    ASSESSMENT AND PLAN: Mr. Arieon Corcoran has established coronary artery disease and is 21 years status post CABG revascularization surgery with LIMA to his LAD, sequential graft to the OM1 and OM2, and sequential graft to the PDA and PLA vessel, and 10 years since undergoing intervention to the vein graft supplying the intermediate vessel.  At that time.  He had an occluded graft to the RCA, as well as an occluded graft to the marginal vessel.  Presently, he seems to be doing well on his current medical regimen.  I reviewed with him.  His recent abdominal aortic ultrasound Again demonstrated mild aneurysmal dilatation of his right common iliac artery.  His blood pressure today is stable.  He's not having any anginal symptoms.  I am rechecking a bmet today to reassess his renal function with his renal insufficiency and last creatinine at 1.67 and serum sodium level.  He is currently taking lisinopril at 10 mg, which was reduced from his previous dose of 20.  He is on aspirin and Plavix for antiplatelet therapy.  I will see him in 6 months for cardiology reevaluation.   [name], MD, [facility]  05/30/2013 8:45 AM

## 2013-05-30 NOTE — Patient Instructions (Signed)
Your physician recommends that you return for lab work today.  Your physician recommends that you schedule a follow-up appointment in: 6 months.

## 2013-06-20 ENCOUNTER — Other Ambulatory Visit: Payer: Self-pay | Admitting: *Deleted

## 2013-06-20 MED ORDER — ROSUVASTATIN CALCIUM 40 MG PO TABS: 40.0000 mg | ORAL_TABLET | Freq: Every day | ORAL | Status: DC

## 2013-06-20 NOTE — Telephone Encounter (Signed)
Rx was sent to pharmacy electronically. 

## 2013-06-23 ENCOUNTER — Encounter: Payer: Self-pay | Admitting: *Deleted

## 2013-07-01 ENCOUNTER — Encounter: Payer: Self-pay | Admitting: *Deleted

## 2013-07-19 ENCOUNTER — Other Ambulatory Visit: Payer: Self-pay

## 2013-07-19 MED ORDER — OMEPRAZOLE 40 MG PO CPDR: 40.0000 mg | DELAYED_RELEASE_CAPSULE | Freq: Every day | ORAL | Status: DC

## 2013-07-19 NOTE — Telephone Encounter (Signed)
Rx was sent to pharmacy electronically. 

## 2013-08-15 ENCOUNTER — Other Ambulatory Visit: Payer: Self-pay | Admitting: *Deleted

## 2013-08-15 ENCOUNTER — Telehealth: Payer: Self-pay | Admitting: Cardiovascular Disease

## 2013-08-15 MED ORDER — CLOPIDOGREL BISULFATE 75 MG PO TABS: 75.0000 mg | ORAL_TABLET | Freq: Every day | ORAL | Status: DC

## 2013-08-15 NOTE — Telephone Encounter (Signed)
Rx was sent to pharmacy electronically. 

## 2013-08-15 NOTE — Telephone Encounter (Signed)
Patient needs refill for generic Plavix 75 mg  # 30 1 daily.  [facility] [address]

## 2013-09-12 ENCOUNTER — Telehealth: Payer: Self-pay | Admitting: Cardiovascular Disease

## 2013-09-12 MED ORDER — AMLODIPINE BESYLATE 10 MG PO TABS: 10.0000 mg | ORAL_TABLET | Freq: Every day | ORAL | Status: DC

## 2013-09-12 NOTE — Telephone Encounter (Signed)
Pt called in stating that he is out of Amlodipine and would [facility]. He is at the pharmacy now. Please call  Thanks

## 2013-09-12 NOTE — Telephone Encounter (Signed)
Rx was sent to pharmacy electronically. 

## 2013-10-27 ENCOUNTER — Telehealth (HOSPITAL_COMMUNITY): Payer: Self-pay | Admitting: *Deleted

## 2013-11-09 ENCOUNTER — Telehealth: Payer: Self-pay | Admitting: Cardiovascular Disease

## 2013-11-11 NOTE — Telephone Encounter (Signed)
Closed encounter °

## 2013-11-19 ENCOUNTER — Other Ambulatory Visit: Payer: Self-pay | Admitting: Cardiovascular Disease

## 2013-11-21 NOTE — Telephone Encounter (Signed)
Rx refill sent to patient pharmacy   

## 2013-11-24 ENCOUNTER — Ambulatory Visit (INDEPENDENT_AMBULATORY_CARE_PROVIDER_SITE_OTHER): Payer: Medicare HMO | Admitting: Cardiovascular Disease

## 2013-11-24 ENCOUNTER — Encounter: Payer: Self-pay | Admitting: Cardiovascular Disease

## 2013-11-24 VITALS — BP 124/70 | HR 60 | Ht 69.5 in | Wt 184.1 lb

## 2013-11-24 DIAGNOSIS — N289 Disorder of kidney and ureter, unspecified: Secondary | ICD-10-CM

## 2013-11-24 DIAGNOSIS — I25810 Atherosclerosis of coronary artery bypass graft(s) without angina pectoris: Secondary | ICD-10-CM

## 2013-11-24 DIAGNOSIS — E785 Hyperlipidemia, unspecified: Secondary | ICD-10-CM

## 2013-11-24 DIAGNOSIS — Z79899 Other long term (current) drug therapy: Secondary | ICD-10-CM

## 2013-11-24 DIAGNOSIS — I10 Essential (primary) hypertension: Secondary | ICD-10-CM

## 2013-11-24 DIAGNOSIS — I723 Aneurysm of iliac artery: Secondary | ICD-10-CM

## 2013-11-24 DIAGNOSIS — I2510 Atherosclerotic heart disease of native coronary artery without angina pectoris: Secondary | ICD-10-CM

## 2013-11-24 MED ORDER — IRBESARTAN 150 MG PO TABS: 150.0000 mg | ORAL_TABLET | Freq: Every day | ORAL | Status: DC

## 2013-11-24 NOTE — Patient Instructions (Addendum)
Your physician has recommended you make the following change in your medication: start new prescription for Irbesartan once you have finished the lisinopril. This has already been sent to your pharmacy.  Your physician recommends that you return for lab work in: 3 weeks.   Your physician wants you to follow-up in: 6 months or sooner if needed with Dr. Claiborne Billings. You will receive a reminder letter in the mail two months in advance. If you don't receive a letter, please call our office to schedule the follow-up appointment.

## 2013-11-24 NOTE — Progress Notes (Signed)
Patient ID: Frederick Kirby, male   DOB: July 24, 1935, 78 y.o.   MRN: [id number]     HPI: Frederick Kirby is a 78 y.o. male who presents for a 6 month cardiology evaluation.  Frederick Kirby has a history of coronary as well as peripheral vascular disease. In 1994 he underwent CABG revascularization surgery( LIMA to the LAD, sequential vein to the OM1 and OM2, sequential vein to the PDA and PLA vessel). In April 2005, a 2.5x24 mm Taxus stent was inserted into the vein graft supplying the ramus intermediate vessel. At that time, his RCA graft was occluded and he also had an occluded graft to the marginal vessel but had a patent LIMA to the LAD.   Additional problems include mixed hyperlipidemia, documented iliac artery aneurysm, mild renal insufficiency, hypertension, as well as hyperlipidemia. He's also had instances with stage II hypertension requiring medication adjustment. He has a history of renal insufficiency  which did improve with reduction of his lisinopril from 20 mg to 10 mg and further titration his metoprolol extended release 75 mg.    Has a history of hyperlipidemia on Crestor 40 mg daily , and lipid studies in March 2015 revealed cholesterol 131, triglycerides 153, HDL 47, LDL 63.  In March 2015 abdominal aortic ultrasound demonstrated  mild amount of nonhemodynamically significant plaque.  There also is mild aneurysmal dilatation of his right common iliac artery , which measured 2.0 x 2.2 cm, unchanged from two years previously.  Over the past 6 months, he mitts to having some episodes of wheezing and sinus drainage.  He denies recurrent angina symptoms.  He denies PND.  He is unaware of palpitations.  He continues to be on antiplatelet therapy.  He presents for evaluation.   Past Medical History  Diagnosis Date  . Coronary artery disease   . Stroke   . Myocardial infarction   . Hypertension   . COPD (chronic obstructive pulmonary disease)   . S/P CABG (coronary artery bypass graft) 1994    . Hyperlipidemia   . Mild renal insufficiency     Past Surgical History  Procedure Laterality Date  . Coronary artery bypass graft  01/1992    LIMA-LAD & diagonal, SVG to diagonal, SVG to OM1 & OM2, VG to PDA & PLA (Dr. Redmond Pulling)  . Transthoracic echocardiogram  10/2008    EF 50-55%, borderline conc LVH, mild posterior wall hypokinesis; mild mitral annular calcification, prolapse of anterior leaflets, mild MR; RVSP 30-43mmHg; mild TR; mild calcification of AV leaflets  . Nm myocar perf wall motion  11/2011    lexiscan - normal study, EF 80% post-stress  . Coronary angioplasty with stent placement  04/28/2003    L Cfx with 99% prox lesion in AV groove; patent SVG to ramus intermedius w/ 95% distal lesion; SVG to OM totally occluded; SVG to RCA totally occluded; native RCA occluded in prox region w/collaterals from LAD into PDA; Taxus 2.5x66mm stent to distal VG to ramus (Dr. Gerrie Nordmann)  . Carotid doppler  12/2011    Right Bulb/Prox ICA 50-69% diameter reduction; Left Bulb/Prox ICA 0-49% diameter reduction  . Renal doppler  12/2011    Right CIA with aneursymal dilatation 2.3x3.1    Allergies  Allergen Reactions  . Niaspan [Niacin Er]     Turned red & his skin burned    Current Outpatient Prescriptions  Medication Sig Dispense Refill  . amLODipine (NORVASC) 10 MG tablet Take 1 tablet (10 mg total) by mouth daily. 30 tablet 8  .  clopidogrel (PLAVIX) 75 MG tablet Take 1 tablet (75 mg total) by mouth daily. 30 tablet 5  . folic acid-pyridoxine-cyancobalamin (FOLBIC) 2.5-25-2 MG TABS Take 1 tablet by mouth daily. 30 each 5  . metoprolol succinate (TOPROL-XL) 50 MG 24 hr tablet TAKE 1 AND 1/2 TABLET BY MOUTH DAILY 45 tablet 3  . Omega-3 Fatty Acids (FISH OIL) 1200 MG CAPS Take 1 capsule by mouth 2 (two) times daily.    Marland Kitchen omeprazole (PRILOSEC) 40 MG capsule Take 1 capsule (40 mg total) by mouth daily. 30 capsule 10  . rosuvastatin (CRESTOR) 40 MG tablet Take 1 tablet (40 mg total) by mouth  daily. 30 tablet 11  . irbesartan (AVAPRO) 150 MG tablet Take 1 tablet (150 mg total) by mouth daily. 30 tablet 6   No current facility-administered medications for this visit.    History   Social History  . Marital Status: Single    Spouse Name: N/A    Number of Children: N/A  . Years of Education: N/A   Occupational History  . Not on file.   Social History Main Topics  . Smoking status: Former Smoker    Quit date: 01/13/2005  . Smokeless tobacco: Not on file  . Alcohol Use: No  . Drug Use: No  . Sexual Activity: Not on file   Other Topics Concern  . Not on file   Social History Narrative   Social history is notable that he is married. He is over 18 grandchildren and at least 7 great grandchildren. He quit smoking 8 years ago. He remains active the  History reviewed. No pertinent family history.  ROS General: Negative; No fevers, chills, or night sweats;  HEENT: [place] for sinus drainage; No changes in vision or hearing, sinus congestion, difficulty swallowing Pulmonary: Positive for questionable wheezing.; No cough, shortness of breath, hemoptysis Cardiovascular: see HPI; No chest pain, presyncope, syncope, palpatations; occasional left ankle swelling GI: Negative; No nausea, vomiting, diarrhea, or abdominal pain GU: Negative; No dysuria, hematuria, or difficulty voiding Musculoskeletal: Negative; no myalgias, joint pain, or weakness Hematologic/Oncology: Negative; no easy bruising, bleeding Endocrine: Negative; no heat/cold intolerance; no diabetes Neuro: Negative; no changes in balance, headaches Skin: Negative; No rashes or skin lesions Psychiatric: Negative; No behavioral problems, depression Sleep: Negative; No snoring, daytime sleepiness, hypersomnolence, bruxism, restless legs, hypnogognic hallucinations, no cataplexy Other comprehensive 14 point system review is negative.  PE BP 124/70 mmHg  Pulse 60  Ht 5' 9.5" (1.765 m)  Wt 184 lb 1.6 oz (83.507 kg)   BMI 26.81 kg/m2  General: Alert, oriented, no distress.  Skin: normal turgor, no rashes HEENT: Normocephalic, atraumatic. Pupils round and reactive; sclera anicteric;no lid lag.  Nose without nasal septal hypertrophy Mouth/Parynx benign; Mallinpatti scale 3 Neck: No JVD, soft right carotid bruit with normal carotid upstroke Lungs: clear to ausculatation and percussion; no wheezing or rales Chest wall: Nontender to palpation Heart: RRR, s1 s2 normal  1/6 systolic murmur; no S3 or S4 gallop.  No diastolic murmur.  No rubs, thrills or heaves. Abdomen: soft, nontender; no hepatosplenomehaly, BS+; abdominal aorta nontender and not dilated by palpation. Back: No CVA tenderness Pulses 2+ Extremities: no clubbing cyanosis or edema, Homan's sign negative  Neurologic: grossly nonfocal Psychologic: normal affect and mood.  ECG (independently read by me): normal sinus rhythm at 60 bpm.  First-degree AV block.  No significant ST-T changes.  QTc interval normal.  May 2015 ECG (independently read by me): Normal sinus rhythm at 62 beats per minute.  First degree AV block.  Prior ECG: Normal sinus rhythm at 69 beats per minute. First degree AV block with PR interval 236 ms. QTc interval normal at 420 ms.  LABS:  BMET    Component Value Date/Time   NA 135 05/30/2013 0851   K 4.2 05/30/2013 0851   CL 99 05/30/2013 0851   CO2 25 05/30/2013 0851   GLUCOSE 98 05/30/2013 0851   BUN 14 05/30/2013 0851   CREATININE 1.59* 05/30/2013 0851   CREATININE 1.55* 11/23/2010 0818   CALCIUM 9.5 05/30/2013 0851   GFRNONAA 43* 11/23/2010 0818   GFRAA 50* 11/23/2010 0818     Hepatic Function Panel     Component Value Date/Time   PROT 7.0 03/30/2013 0940   ALBUMIN 4.3 03/30/2013 0940   AST 23 03/30/2013 0940   ALT 33 03/30/2013 0940   ALKPHOS 89 03/30/2013 0940   BILITOT 0.5 03/30/2013 0940     CBC    Component Value Date/Time   WBC 7.2 03/30/2013 0940   RBC 4.38 03/30/2013 0940   RBC 2.90*  12/15/2009 1226   HGB 13.4 03/30/2013 0940   HCT 38.9* 03/30/2013 0940   PLT 282 03/30/2013 0940   MCV 88.8 03/30/2013 0940   MCH 30.6 03/30/2013 0940   MCHC 34.4 03/30/2013 0940   RDW 12.9 03/30/2013 0940   LYMPHSABS 1.6 12/15/2009 1155   MONOABS 0.9 12/15/2009 1155   EOSABS 0.0 12/15/2009 1155   BASOSABS 0.0 12/15/2009 1155     BNP No results found for: PROBNP  Lipid Panel     Component Value Date/Time   CHOL 131 03/30/2013 0940   TRIG 153* 03/30/2013 0940   HDL 37* 03/30/2013 0940   CHOLHDL 3.5 03/30/2013 0940   VLDL 31 03/30/2013 0940   LDLCALC 63 03/30/2013 0940     RADIOLOGY: No results found.    ASSESSMENT AND PLAN: Mr. Tyron Manetta a 78 year old male who has established coronary artery disease and is 21 years status post CABG revascularization surgery with LIMA to his LAD, sequential graft to the OM1 and OM2, and sequential graft to the PDA and PLA vessel, and 10 years since undergoing intervention to the vein graft supplying the intermediate vessel.  At that time.  He had an occluded graft to the RCA, as well as an occluded graft to the marginal vessel.  Presently, he seems to be doing well on his current medical regimen. He continues to be on high platelet therapy and is tolerating this well.  His recent abdominal aortic ultrasound  demonstrated mild aneurysmal dilatation of his right common iliac artery.  His blood pressure today is stable.  He's not having any anginal symptoms. He has had issues with sinus drainage and his concern about the possibility at times of noting some wheezing.  I have suggested he try antihistamine therapy with Zyrtec or Claritin but to avoid any deep preparations with pseudoephedrine. Due to  improved tolerability I am electing to change him from ACE inhibition to ARB therapy her blood pressure control, which may also benefit potential wheezing.  I will start him on irbesartan 150 mg.  Follow-up laboratory will be obtained to make certain he  is tolerating this from a renal standpoint. If he continues to note additional symptoms, it may be necessary to reduce his beta blocker therapy or switch this to another agent.  We will contact him regarding his laboratory and adjustments will be made if necessary to his medical regimen.  I will see him in the  office in follow-up evaluation and 4 to 6 months.  [name], MD, [facility]  11/26/2013 10:27 AM

## 2013-11-29 ENCOUNTER — Ambulatory Visit: Payer: Medicare HMO | Admitting: Cardiovascular Disease

## 2013-12-04 ENCOUNTER — Emergency Department (HOSPITAL_COMMUNITY)
Admission: EM | Admit: 2013-12-04 | Discharge: 2013-12-04 | Disposition: A | Discharge status: Home / Self Care | Payer: Medicare HMO | Attending: Emergency Medicine | Admitting: Emergency Medicine

## 2013-12-04 ENCOUNTER — Encounter (HOSPITAL_COMMUNITY): Payer: Self-pay | Admitting: *Deleted

## 2013-12-04 DIAGNOSIS — I252 Old myocardial infarction: Secondary | ICD-10-CM | POA: Insufficient documentation

## 2013-12-04 DIAGNOSIS — Z9861 Coronary angioplasty status: Secondary | ICD-10-CM | POA: Insufficient documentation

## 2013-12-04 DIAGNOSIS — J449 Chronic obstructive pulmonary disease, unspecified: Secondary | ICD-10-CM | POA: Diagnosis not present

## 2013-12-04 DIAGNOSIS — Z8673 Personal history of transient ischemic attack (TIA), and cerebral infarction without residual deficits: Secondary | ICD-10-CM | POA: Diagnosis not present

## 2013-12-04 DIAGNOSIS — Z87891 Personal history of nicotine dependence: Secondary | ICD-10-CM | POA: Diagnosis not present

## 2013-12-04 DIAGNOSIS — E785 Hyperlipidemia, unspecified: Secondary | ICD-10-CM | POA: Diagnosis not present

## 2013-12-04 DIAGNOSIS — R339 Retention of urine, unspecified: Secondary | ICD-10-CM

## 2013-12-04 DIAGNOSIS — Z951 Presence of aortocoronary bypass graft: Secondary | ICD-10-CM | POA: Insufficient documentation

## 2013-12-04 DIAGNOSIS — N390 Urinary tract infection, site not specified: Secondary | ICD-10-CM | POA: Insufficient documentation

## 2013-12-04 DIAGNOSIS — Z79899 Other long term (current) drug therapy: Secondary | ICD-10-CM | POA: Insufficient documentation

## 2013-12-04 DIAGNOSIS — I10 Essential (primary) hypertension: Secondary | ICD-10-CM | POA: Insufficient documentation

## 2013-12-04 DIAGNOSIS — I2510 Atherosclerotic heart disease of native coronary artery without angina pectoris: Secondary | ICD-10-CM | POA: Diagnosis not present

## 2013-12-04 LAB — BASIC METABOLIC PANEL
Anion gap: 15 (ref 5–15)
BUN: 13 mg/dL (ref 6–23)
CALCIUM: 9.1 mg/dL (ref 8.4–10.5)
CO2: 23 mEq/L (ref 19–32)
Chloride: 99 mEq/L (ref 96–112)
Creatinine, Ser: 1.48 mg/dL — ABNORMAL HIGH (ref 0.50–1.35)
GFR calc Af Amer: 51 mL/min — ABNORMAL LOW (ref >90)
GFR calc non Af Amer: 44 mL/min — ABNORMAL LOW (ref >90)
Glucose, Bld: 134 mg/dL — ABNORMAL HIGH (ref 70–99)
Potassium: 3.9 mEq/L (ref 3.7–5.3)
SODIUM: 137 meq/L (ref 137–147)

## 2013-12-04 LAB — CBC WITH DIFFERENTIAL/PLATELET
Basophils Absolute: 0 10*3/uL (ref 0.0–0.1)
Basophils Relative: 0 % (ref 0–1)
EOS PCT: 1 % (ref 0–5)
Eosinophils Absolute: 0.2 10*3/uL (ref 0.0–0.7)
HEMATOCRIT: 38.7 % — AB (ref 39.0–52.0)
HEMOGLOBIN: 13 g/dL (ref 13.0–17.0)
LYMPHS ABS: 1.2 10*3/uL (ref 0.7–4.0)
Lymphocytes Relative: 9 % — ABNORMAL LOW (ref 12–46)
MCH: 29.7 pg (ref 26.0–34.0)
MCHC: 33.6 g/dL (ref 30.0–36.0)
MCV: 88.6 fL (ref 78.0–100.0)
MONO ABS: 0.9 10*3/uL (ref 0.1–1.0)
Monocytes Relative: 7 % (ref 3–12)
Neutro Abs: 10.2 10*3/uL — ABNORMAL HIGH (ref 1.7–7.7)
Neutrophils Relative %: 83 % — ABNORMAL HIGH (ref 43–77)
PLATELETS: 215 10*3/uL (ref 150–400)
RBC: 4.37 MIL/uL (ref 4.22–5.81)
RDW: 13 % (ref 11.5–15.5)
WBC: 12.4 10*3/uL — AB (ref 4.0–10.5)

## 2013-12-04 LAB — URINALYSIS, ROUTINE W REFLEX MICROSCOPIC
BILIRUBIN URINE: NEGATIVE
Glucose, UA: NEGATIVE mg/dL
Ketones, ur: NEGATIVE mg/dL
Nitrite: POSITIVE — AB
PROTEIN: 30 mg/dL — AB
Specific Gravity, Urine: 1.019 (ref 1.005–1.030)
Urobilinogen, UA: 0.2 mg/dL (ref 0.0–1.0)
pH: 7 (ref 5.0–8.0)

## 2013-12-04 LAB — URINE MICROSCOPIC-ADD ON

## 2013-12-04 MED ORDER — CEFTRIAXONE SODIUM 1 G IJ SOLR: 1.0000 g | Freq: Once | INTRAMUSCULAR | Status: DC

## 2013-12-04 MED ORDER — DEXTROSE 5 % IV SOLN: 1.0000 g | Freq: Once | INTRAVENOUS | Status: DC

## 2013-12-04 MED ORDER — CIPROFLOXACIN HCL 500 MG PO TABS: 500.0000 mg | ORAL_TABLET | Freq: Two times a day (BID) | ORAL | Status: DC

## 2013-12-04 MED ORDER — DEXTROSE 5 % IV SOLN
1.0000 g | Freq: Once | INTRAVENOUS | Status: AC
  Administered 2013-12-04: 1 g via INTRAVENOUS
  Filled 2013-12-04: qty 10

## 2013-12-04 NOTE — Discharge Instructions (Signed)
Take cipro for a week.   Follow up with [facility] in 2 days. They will try and take out your catheter.   Return to ER if you have severe pain, fever, vomiting, foley not draining.

## 2013-12-04 NOTE — ED Provider Notes (Signed)
CSN: [id number]     Arrival date & time 12/04/13  4709 History   First MD Initiated Contact with Patient 12/04/13 [phone]     Chief Complaint  Patient presents with  . Urinary Retention     (Consider location/radiation/quality/duration/timing/severity/associated sxs/prior Treatment) The history is provided by the patient.  Frederick Kirby is a 78 y.o. male hx of CAD, MI, HTN here with possible urinary retention. Patient last urinated 10:30 PM last night. Then had some dribbling. Also worsening lower abdominal pain. Denies dysuria yesterday or hematuria. Denies fevers or vomiting. No hx of BPH or prostate cancer.    Past Medical History  Diagnosis Date  . Coronary artery disease   . Stroke   . Myocardial infarction   . Hypertension   . COPD (chronic obstructive pulmonary disease)   . S/P CABG (coronary artery bypass graft) 1994  . Hyperlipidemia   . Mild renal insufficiency    Past Surgical History  Procedure Laterality Date  . Coronary artery bypass graft  01/1992    LIMA-LAD & diagonal, SVG to diagonal, SVG to OM1 & OM2, VG to PDA & PLA (Dr. Redmond Pulling)  . Transthoracic echocardiogram  10/2008    EF 50-55%, borderline conc LVH, mild posterior wall hypokinesis; mild mitral annular calcification, prolapse of anterior leaflets, mild MR; RVSP 30-79mmHg; mild TR; mild calcification of AV leaflets  . Nm myocar perf wall motion  11/2011    lexiscan - normal study, EF 80% post-stress  . Coronary angioplasty with stent placement  04/28/2003    L Cfx with 99% prox lesion in AV groove; patent SVG to ramus intermedius w/ 95% distal lesion; SVG to OM totally occluded; SVG to RCA totally occluded; native RCA occluded in prox region w/collaterals from LAD into PDA; Taxus 2.5x7mm stent to distal VG to ramus (Dr. Gerrie Nordmann)  . Carotid doppler  12/2011    Right Bulb/Prox ICA 50-69% diameter reduction; Left Bulb/Prox ICA 0-49% diameter reduction  . Renal doppler  12/2011    Right CIA with aneursymal  dilatation 2.3x3.1   History reviewed. No pertinent family history. History  Substance Use Topics  . Smoking status: Former Smoker    Quit date: 01/13/2005  . Smokeless tobacco: Never Used  . Alcohol Use: No    Review of Systems  Genitourinary: Positive for difficulty urinating.  All other systems reviewed and are negative.     Allergies  Niaspan  Home Medications   Prior to Admission medications   Medication Sig Start Date End Date Taking? Authorizing Provider  amLODipine (NORVASC) 10 MG tablet Take 1 tablet (10 mg total) by mouth daily. 09/12/13  Yes [name], MD  clopidogrel (PLAVIX) 75 MG tablet Take 1 tablet (75 mg total) by mouth daily. 08/15/13  Yes [name], MD  folic acid-pyridoxine-cyancobalamin (FOLBIC) 2.5-25-2 MG TABS Take 1 tablet by mouth daily. 04/08/13  Yes [name], MD  irbesartan (AVAPRO) 150 MG tablet Take 1 tablet (150 mg total) by mouth daily. 11/24/13  Yes [name], MD  metoprolol succinate (TOPROL-XL) 50 MG 24 hr tablet TAKE 1 AND 1/2 TABLET BY MOUTH DAILY 11/21/13  Yes [name], MD  Omega-3 Fatty Acids (FISH OIL) 1200 MG CAPS Take 1 capsule by mouth 2 (two) times daily.   Yes Historical Provider, MD  omeprazole (PRILOSEC) 40 MG capsule Take 1 capsule (40 mg total) by mouth daily. 07/19/13  Yes [name], MD  rosuvastatin (CRESTOR) 40 MG tablet Take 1 tablet (  40 mg total) by mouth daily. 06/20/13  Yes [name], MD   BP 137/62 mmHg  Pulse 72  Temp(Src) 97.5 F (36.4 C) (Oral)  Resp 16  Ht 5\' 9"  (1.753 m)  Wt 184 lb (83.462 kg)  BMI 27.16 kg/m2  SpO2 94% Physical Exam  Constitutional:  Uncomfortable   HENT:  Head: Normocephalic.  Eyes: Conjunctivae are normal. Pupils are equal, round, and reactive to light.  Neck: Normal range of motion. Neck supple.  Cardiovascular: Normal rate, regular rhythm and normal heart sounds.   Pulmonary/Chest: Effort normal and breath sounds normal. No respiratory distress. He has no  wheezes. He has no rales.  Abdominal: Soft. Bowel sounds are normal.  Mild suprapubic tenderness   Genitourinary:  Redness on tip of penis. Prostate slightly enlarged but nontender   Musculoskeletal: Normal range of motion. He exhibits no edema or tenderness.  Neurological: He is alert. No cranial nerve deficit.  Skin: Skin is warm and dry.  Psychiatric: He has a normal mood and affect. His behavior is normal. Judgment and thought content normal.  Nursing note reviewed.   ED Course  Procedures (including critical care time) Labs Review Labs Reviewed  CBC WITH DIFFERENTIAL - Abnormal; Notable for the following:    WBC 12.4 (*)    HCT 38.7 (*)    Neutrophils Relative % 83 (*)    Neutro Abs 10.2 (*)    Lymphocytes Relative 9 (*)    All other components within normal limits  BASIC METABOLIC PANEL - Abnormal; Notable for the following:    Glucose, Bld 134 (*)    Creatinine, Ser 1.48 (*)    GFR calc non Af Amer 44 (*)    GFR calc Af Amer 51 (*)    All other components within normal limits  URINALYSIS, ROUTINE W REFLEX MICROSCOPIC - Abnormal; Notable for the following:    APPearance CLOUDY (*)    Hgb urine dipstick MODERATE (*)    Protein, ur 30 (*)    Nitrite POSITIVE (*)    Leukocytes, UA LARGE (*)    All other components within normal limits  URINE MICROSCOPIC-ADD ON - Abnormal; Notable for the following:    Bacteria, UA MANY (*)    All other components within normal limits  URINE CULTURE    Imaging Review No results found.   EKG Interpretation None      MDM   Final diagnoses:  None   Frederick Kirby is a 78 y.o. male here with possible urinary retention. Bedside US showed bladder volume around 150 cc. Foley placed since he is unable to urinate and about 150 cc came out. Labs show WBC 12. Cr at baseline. UA + UTI. Given ceftriaxone. Will d/c home with cipro. I think retention likely from UTI rather than simply BPH. Will keep foley in and have him f/u with urology.     Wandra Arthurs, MD 12/04/13 [phone]

## 2013-12-04 NOTE — ED Notes (Signed)
Pt said last night he was able to void 1 time really well at 1030 pm. Then felt like he had to go every 15 min but was only going a little bit. Pt. States that he feels a pressure on his lower abdomen and that the head of his penis is red.

## 2013-12-04 NOTE — ED Notes (Signed)
Pt.was bladderscan  Show no urine in the bladder.when dr.yao use the utrasound found small amount in the bladder.nurse placed foley cath in .15o in foley bag

## 2013-12-04 NOTE — ED Notes (Signed)
Pt. Refused wheelchair 

## 2013-12-06 LAB — URINE CULTURE

## 2013-12-08 ENCOUNTER — Telehealth (HOSPITAL_BASED_OUTPATIENT_CLINIC_OR_DEPARTMENT_OTHER): Payer: Self-pay | Admitting: *Deleted

## 2013-12-08 NOTE — Telephone Encounter (Signed)
(+)   urine culture treated with Cipro, OK per JFrens, Pharm 

## 2013-12-09 ENCOUNTER — Telehealth: Payer: Self-pay | Admitting: *Deleted

## 2013-12-09 ENCOUNTER — Other Ambulatory Visit: Payer: Self-pay | Admitting: *Deleted

## 2013-12-09 MED ORDER — LISINOPRIL 20 MG PO TABS: 10.0000 mg | ORAL_TABLET | Freq: Every day | ORAL | Status: DC

## 2013-12-09 NOTE — Telephone Encounter (Signed)
Patient recently had an allergic reaction to [facility] and has started back on Lisinopril 20mg  , .5 tablets daily. Wanted Dr Claiborne Billings to know. Will forward to Dr Claiborne Billings.

## 2013-12-11 NOTE — Telephone Encounter (Signed)
ok 

## 2013-12-14 ENCOUNTER — Telehealth: Payer: Self-pay | Admitting: Cardiovascular Disease

## 2013-12-14 NOTE — Telephone Encounter (Signed)
Called pt, no answer. Left message to call back. 

## 2013-12-14 NOTE — Telephone Encounter (Signed)
Please call, he can not take the new blood pressure medicine. He wants to go back on Lisinopril.

## 2013-12-15 ENCOUNTER — Other Ambulatory Visit: Payer: Self-pay | Admitting: *Deleted

## 2013-12-15 NOTE — Telephone Encounter (Signed)
Pt's wife stated pt was having side effects of stomach pain and cramps with new medication (Irbesartan 150mg ). Wanted to go back on Lisinopril 10mg . Advised would discuss with Dr. Claiborne Billings and return call. Reviewed chart, pt was recently switched based on pt's preference due to SE of cough from the ACE. Spoke with Dr. Claiborne Billings and he was fine with pt resuming the ACE inhibitor.

## 2013-12-27 ENCOUNTER — Telehealth (HOSPITAL_COMMUNITY): Payer: Self-pay | Admitting: *Deleted

## 2014-01-24 ENCOUNTER — Other Ambulatory Visit (HOSPITAL_COMMUNITY): Payer: Self-pay | Admitting: Cardiovascular Disease

## 2014-01-24 DIAGNOSIS — I723 Aneurysm of iliac artery: Secondary | ICD-10-CM

## 2014-02-02 ENCOUNTER — Ambulatory Visit (HOSPITAL_COMMUNITY)
Admission: RE | Admit: 2014-02-02 | Discharge: 2014-02-02 | Disposition: A | Discharge status: Home / Self Care | Payer: PPO | Source: Ambulatory Visit | Attending: Cardiology | Admitting: Cardiology

## 2014-02-02 DIAGNOSIS — I739 Peripheral vascular disease, unspecified: Secondary | ICD-10-CM | POA: Diagnosis not present

## 2014-02-02 DIAGNOSIS — I723 Aneurysm of iliac artery: Secondary | ICD-10-CM | POA: Insufficient documentation

## 2014-02-02 NOTE — Progress Notes (Signed)
Carotid Duplex Completed. °Brianna L Mazza,RVT °

## 2014-02-08 ENCOUNTER — Encounter: Payer: Self-pay | Admitting: *Deleted

## 2014-03-09 ENCOUNTER — Other Ambulatory Visit: Payer: Self-pay | Admitting: Cardiovascular Disease

## 2014-03-09 NOTE — Telephone Encounter (Signed)
Rx has been sent to the pharmacy electronically. ° °

## 2014-03-19 ENCOUNTER — Other Ambulatory Visit: Payer: Self-pay | Admitting: Cardiovascular Disease

## 2014-04-16 ENCOUNTER — Other Ambulatory Visit: Payer: Self-pay | Admitting: Cardiovascular Disease

## 2014-04-17 NOTE — Telephone Encounter (Signed)
Rx has been sent to the pharmacy electronically. ° °

## 2014-05-14 ENCOUNTER — Other Ambulatory Visit: Payer: Self-pay | Admitting: Cardiovascular Disease

## 2014-05-15 NOTE — Telephone Encounter (Signed)
Rx refill denied to patient pharmacy, Rx was sent in 04/17/14

## 2014-06-09 ENCOUNTER — Other Ambulatory Visit: Payer: Self-pay | Admitting: Cardiovascular Disease

## 2014-06-09 ENCOUNTER — Other Ambulatory Visit: Payer: Self-pay | Admitting: *Deleted

## 2014-06-09 MED ORDER — ROSUVASTATIN CALCIUM 40 MG PO TABS: 40.0000 mg | ORAL_TABLET | Freq: Every day | ORAL | Status: DC

## 2014-07-31 ENCOUNTER — Telehealth: Payer: Self-pay | Admitting: Cardiovascular Disease

## 2014-07-31 ENCOUNTER — Other Ambulatory Visit: Payer: Self-pay | Admitting: Cardiovascular Disease

## 2014-08-01 NOTE — Telephone Encounter (Signed)
Closed encounter °

## 2014-09-12 ENCOUNTER — Other Ambulatory Visit: Payer: Self-pay | Admitting: Cardiovascular Disease

## 2014-09-12 NOTE — Telephone Encounter (Signed)
Rx request sent to pharmacy.  

## 2014-10-12 ENCOUNTER — Other Ambulatory Visit: Payer: Self-pay | Admitting: Cardiovascular Disease

## 2014-10-12 NOTE — Telephone Encounter (Signed)
REFILL 

## 2014-10-15 ENCOUNTER — Other Ambulatory Visit: Payer: Self-pay | Admitting: Cardiovascular Disease

## 2014-10-16 NOTE — Telephone Encounter (Signed)
REFILL 

## 2014-10-17 ENCOUNTER — Ambulatory Visit (INDEPENDENT_AMBULATORY_CARE_PROVIDER_SITE_OTHER): Payer: PPO | Admitting: Cardiovascular Disease

## 2014-10-17 ENCOUNTER — Encounter: Payer: Self-pay | Admitting: Cardiovascular Disease

## 2014-10-17 VITALS — BP 132/74 | HR 58 | Ht 69.5 in | Wt 183.1 lb

## 2014-10-17 DIAGNOSIS — I25700 Atherosclerosis of coronary artery bypass graft(s), unspecified, with unstable angina pectoris: Secondary | ICD-10-CM | POA: Diagnosis not present

## 2014-10-17 DIAGNOSIS — R0989 Other specified symptoms and signs involving the circulatory and respiratory systems: Secondary | ICD-10-CM

## 2014-10-17 DIAGNOSIS — E785 Hyperlipidemia, unspecified: Secondary | ICD-10-CM

## 2014-10-17 DIAGNOSIS — Z79899 Other long term (current) drug therapy: Secondary | ICD-10-CM | POA: Diagnosis not present

## 2014-10-17 DIAGNOSIS — I723 Aneurysm of iliac artery: Secondary | ICD-10-CM | POA: Diagnosis not present

## 2014-10-17 DIAGNOSIS — N289 Disorder of kidney and ureter, unspecified: Secondary | ICD-10-CM

## 2014-10-17 DIAGNOSIS — I10 Essential (primary) hypertension: Secondary | ICD-10-CM

## 2014-10-17 NOTE — Progress Notes (Signed)
Patient ID: JIYAN WALKOWSKI, male   DOB: December 26, 1935, 79 y.o.   MRN: [id number]     HPI: MARGARET STAGGS is a 79 y.o. male who presents for an 80 month cardiology evaluation.  Mr. Mullaly has a history of coronary as well as peripheral vascular disease. In 1994 he underwent CABG revascularization surgery( LIMA to the LAD, sequential vein to the OM1 and OM2, sequential vein to the PDA and PLA vessel). In April 2005, a 2.5x24 mm Taxus stent was inserted into the vein graft supplying the ramus intermediate vessel. At that time, his RCA graft was occluded and he also had an occluded graft to the marginal vessel but had a patent LIMA to the LAD.   Additional problems include mixed hyperlipidemia, documented iliac artery aneurysm, mild renal insufficiency, hypertension, as well as hyperlipidemia. He's also had instances with stage II hypertension requiring medication adjustment. He has a history of renal insufficiency  which did improve with reduction of his lisinopril from 20 mg to 10 mg and further titration his metoprolol extended release 75 mg.    Has a history of hyperlipidemia on Crestor 40 mg daily , and lipid studies in March 2015 revealed cholesterol 131, triglycerides 153, HDL 47, LDL 63.  In March 2015 abdominal aortic ultrasound demonstrated  mild amount of nonhemodynamically significant plaque.  There also is mild aneurysmal dilatation of his right common iliac artery , which measured 2.0 x 2.2 cm, unchanged from two years previously.  I last saw him was one year ago.  Over the year, he has continued to remain stable.  He denies any episodes of chest pain.  He is unaware of palpitations.  He had been started on irbesartan, but apparently did not tolerate this well and in his resume taking lisinopril at 10 mg daily in addition to amlodipine 10 mg and Toprol-XL 75 mg for hyperlipidemia.  He continues to be on Crestor 40 mg and fish oil for hyperlipidemia.  He also is on omeprazole for GERD and continues  to take Plavix.  Past Medical History  Diagnosis Date  . Coronary artery disease   . Stroke ([place])   . Myocardial infarction ([place])   . Hypertension   . COPD (chronic obstructive pulmonary disease) ([place])   . S/P CABG (coronary artery bypass graft) 1994  . Hyperlipidemia   . Mild renal insufficiency     Past Surgical History  Procedure Laterality Date  . Coronary artery bypass graft  01/1992    LIMA-LAD & diagonal, SVG to diagonal, SVG to OM1 & OM2, VG to PDA & PLA (Dr. Redmond Pulling)  . Transthoracic echocardiogram  10/2008    EF 50-55%, borderline conc LVH, mild posterior wall hypokinesis; mild mitral annular calcification, prolapse of anterior leaflets, mild MR; RVSP 30-27mmHg; mild TR; mild calcification of AV leaflets  . Nm myocar perf wall motion  11/2011    lexiscan - normal study, EF 80% post-stress  . Coronary angioplasty with stent placement  04/28/2003    L Cfx with 99% prox lesion in AV groove; patent SVG to ramus intermedius w/ 95% distal lesion; SVG to OM totally occluded; SVG to RCA totally occluded; native RCA occluded in prox region w/collaterals from LAD into PDA; Taxus 2.5x18mm stent to distal VG to ramus (Dr. Gerrie Nordmann)  . Carotid doppler  12/2011    Right Bulb/Prox ICA 50-69% diameter reduction; Left Bulb/Prox ICA 0-49% diameter reduction  . Renal doppler  12/2011    Right CIA with aneursymal dilatation 2.3x3.1  Allergies  Allergen Reactions  . Niaspan [Niacin Er]     Turned red & his skin burned    Current Outpatient Prescriptions  Medication Sig Dispense Refill  . amLODipine (NORVASC) 10 MG tablet TAKE 1 TABLET (10 MG TOTAL) BY MOUTH DAILY. 30 tablet 5  . clopidogrel (PLAVIX) 75 MG tablet TAKE 1 TABLET BY MOUTH EVERY DAY 30 tablet 1  . FOLBIC 2.5-25-2 MG TABS TAKE 1 TABLET BY MOUTH DAILY. 30 tablet 6  . lisinopril (PRINIVIL,ZESTRIL) 20 MG tablet TAKE 1 TABLET (20 MG TOTAL) BY MOUTH DAILY. NEED APPOINTMENT FOR FUTURE REFILLS 45 tablet 1  . metoprolol succinate  (TOPROL-XL) 50 MG 24 hr tablet Take 50 mg by mouth daily. Take 1 & 1/4 tablet    . Omega-3 Fatty Acids (FISH OIL) 1200 MG CAPS Take 1 capsule by mouth 2 (two) times daily.    Marland Kitchen omeprazole (PRILOSEC) 40 MG capsule TAKE 1 CAPSULE (40 MG TOTAL) BY MOUTH DAILY. 30 capsule 5  . rosuvastatin (CRESTOR) 40 MG tablet Take 1 tablet (40 mg total) by mouth daily. 30 tablet 11   No current facility-administered medications for this visit.    Social History   Social History  . Marital Status: Single    Spouse Name: N/A  . Number of Children: N/A  . Years of Education: N/A   Occupational History  . Not on file.   Social History Main Topics  . Smoking status: Former Smoker    Quit date: 01/13/2005  . Smokeless tobacco: Never Used  . Alcohol Use: No  . Drug Use: No  . Sexual Activity: Not Currently   Other Topics Concern  . Not on file   Social History Narrative   Social history is notable that he is married. He is over 18 grandchildren and at least 7 great grandchildren. He quit smoking 8 years ago. He remains active the  No family history on file.  ROS General: Negative; No fevers, chills, or night sweats;  HEENT: [place] for sinus drainage; No changes in vision or hearing, sinus congestion, difficulty swallowing Pulmonary: Positive for questionable wheezing.; No cough, shortness of breath, hemoptysis Cardiovascular: see HPI; No chest pain, presyncope, syncope, palpatations; occasional left ankle swelling GI: Negative; No nausea, vomiting, diarrhea, or abdominal pain GU: Negative; No dysuria, hematuria, or difficulty voiding Musculoskeletal: Negative; no myalgias, joint pain, or weakness Hematologic/Oncology: Negative; no easy bruising, bleeding Endocrine: Negative; no heat/cold intolerance; no diabetes Neuro: Negative; no changes in balance, headaches Skin: Negative; No rashes or skin lesions Psychiatric: Negative; No behavioral problems, depression Sleep: Negative; No snoring,  daytime sleepiness, hypersomnolence, bruxism, restless legs, hypnogognic hallucinations, no cataplexy Other comprehensive 14 point system review is negative.   BP 132/74 mmHg  Pulse 58  Ht 5' 9.5" (1.765 m)  Wt 183 lb 1.6 oz (83.054 kg)  BMI 26.66 kg/m2  Repeat blood pressure 118/70.  Wt Readings from Last 3 Encounters:  10/17/14 183 lb 1.6 oz (83.054 kg)  12/04/13 184 lb (83.462 kg)  11/24/13 184 lb 1.6 oz (83.507 kg)   General: Alert, oriented, no distress.  Skin: normal turgor, no rashes HEENT: Normocephalic, atraumatic. Pupils round and reactive; sclera anicteric;no lid lag.  Nose without nasal septal hypertrophy Mouth/Parynx benign; Mallinpatti scale 3 Neck: No JVD, soft right carotid bruit with normal carotid upstroke Lungs: clear to ausculatation and percussion; no wheezing or rales Chest wall: Nontender to palpation Heart: RRR, s1 s2 normal  1/6 systolic murmur; no S3 or S4 gallop.  No diastolic murmur.  No rubs, thrills or heaves. Abdomen: soft, nontender; no hepatosplenomehaly, BS+; abdominal aorta nontender and not dilated by palpation. Back: No CVA tenderness Pulses 2+ Extremities: no clubbing cyanosis or edema, Homan's sign negative  Neurologic: grossly nonfocal Psychologic: normal affect and mood.  ECG (independently read by me): Sinus bradycardia 58 bpm.,  First-degree AV block with increased PR interval at 272 ms.  ECG (independently read by me): normal sinus rhythm at 60 bpm.  First-degree AV block.  No significant ST-T changes.  QTc interval normal.  May 2015 ECG (independently read by me): Normal sinus rhythm at 62 beats per minute.  First degree AV block.  Prior ECG: Normal sinus rhythm at 69 beats per minute. First degree AV block with PR interval 236 ms. QTc interval normal at 420 ms.  LABS: BMP Latest Ref Rng 12/04/2013 05/30/2013 03/30/2013  Glucose 70 - 99 mg/dL 134(H) 98 108(H)  BUN 6 - 23 mg/dL $Remove'13 14 16  'hpmzErz$ Creatinine 0.50 - 1.35 mg/dL 1.48(H) 1.59(H)  1.67(H)  Sodium 137 - 147 mEq/L 137 135 133(L)  Potassium 3.7 - 5.3 mEq/L 3.9 4.2 4.3  Chloride 96 - 112 mEq/L 99 99 97  CO2 19 - 32 mEq/L $Remove'23 25 27  'qMkbbBW$ Calcium 8.4 - 10.5 mg/dL 9.1 9.5 9.6   Hepatic Function Latest Ref Rng 03/30/2013 06/04/2012 11/23/2010  Total Protein 6.0 - 8.3 g/dL 7.0 6.8 7.6  Albumin 3.5 - 5.2 g/dL 4.3 4.1 3.7  AST 0 - 37 U/L $Remo'23 20 21  'mOyNO$ ALT 0 - 53 U/L 33 14 17  Alk Phosphatase 39 - 117 U/L 89 78 109  Total Bilirubin 0.2 - 1.2 mg/dL 0.5 0.4 0.3   CBC Latest Ref Rng 12/04/2013 03/30/2013 06/04/2012  WBC 4.0 - 10.5 K/uL 12.4(H) 7.2 5.1  Hemoglobin 13.0 - 17.0 g/dL 13.0 13.4 10.5(L)  Hematocrit 39.0 - 52.0 % 38.7(L) 38.9(L) 32.2(L)  Platelets 150 - 400 K/uL 215 282 269   Lab Results  Component Value Date   MCV 88.6 12/04/2013   MCV 88.8 03/30/2013   MCV 82.1 06/04/2012   Lab Results  Component Value Date   TSH 4.531* 03/30/2013  No results found for: HGBA1C   Lipid Panel     Component Value Date/Time   CHOL 131 03/30/2013 0940   TRIG 153* 03/30/2013 0940   HDL 37* 03/30/2013 0940   CHOLHDL 3.5 03/30/2013 0940   VLDL 31 03/30/2013 0940   LDLCALC 63 03/30/2013 0940    RADIOLOGY: No results found.    ASSESSMENT AND PLAN: Mr. Sade Mehlhoff is a 79 year old Caucasian male who is 22 years status post CABG revascularization surgery with LIMA to his LAD, sequential graft to the OM1 and OM2, and sequential graft to the PDA and PLA vessel. He is 11 years since undergoing intervention to the vein graft supplying the intermediate vessel.  At that time he had an occluded graft to the RCA, as well as an occluded graft to the marginal vessel.  Presently, he seems to be doing well on his current medical regimen. He continues to be on anti- platelet therapy and is tolerating this well.  When I saw him last year.  I substituted irbesartan in place of lisinopril.  Apparently, he did not tolerate this and is back taking lisinopril 10 mg daily.  He is not having any wheezing.  He  does have first degree AV block and is PR interval today has further progressed to 72 ms.  He is bradycardic at 58.  I am suggesting  a slight reduction of his Toprol-XL dose from 75 mg to 62.5 mg.  I am recommending a complete set of laboratory be obtained in the fasting state.  In March 2017, he will undergo a follow-up abdominal ultrasound to reassess his abdominal aortic aneurysm, and he will also undergo follow-up carotid duplex imaging.  I will see him in April 2017 and further recommendations will be made at that time.  Time spent: 25 minutes  [name], MD, [facility]  10/17/2014 1:23 PM

## 2014-10-17 NOTE — Patient Instructions (Addendum)
Your physician has requested that you have an abdominal aorta duplex IN March 2017. During this test, an ultrasound is used to evaluate the aorta. Allow 30 minutes for this exam. Do not eat after midnight the day before and avoid carbonated beverages  Your physician has requested that you have a carotid duplex in March 2017. This test is an ultrasound of the carotid arteries in your neck. It looks at blood flow through these arteries that supply the brain with blood. Allow one hour for this exam. There are no restrictions or special instructions.  Your physician recommends that you return for lab work FASTING.  Your physician recommends that you schedule a follow-up appointment in: April 2017.  Your physician has recommended you make the following change in your medication: decrease the metoprolol to 62.5 mg daily ( 1 & 1/4 tablet daily )

## 2014-11-13 ENCOUNTER — Other Ambulatory Visit: Payer: Self-pay | Admitting: Cardiovascular Disease

## 2014-11-14 ENCOUNTER — Other Ambulatory Visit: Payer: Self-pay | Admitting: Cardiovascular Disease

## 2014-11-15 ENCOUNTER — Telehealth: Payer: Self-pay | Admitting: Cardiovascular Disease

## 2014-11-15 NOTE — Telephone Encounter (Signed)
Spoke with Marissa @ [facility] she informs me the generic crestor was filled yesterday. I will notify patient and wife of this.

## 2014-11-15 NOTE — Telephone Encounter (Signed)
Pt's wife called in stating that when they went to the pharmacy to pick up his Crestor and the pharmacy told him that he would need approval from his doctor first. Please f/u with the pt.  Thanks

## 2014-12-10 ENCOUNTER — Other Ambulatory Visit: Payer: Self-pay | Admitting: Cardiovascular Disease

## 2014-12-11 NOTE — Telephone Encounter (Signed)
Rx(s) sent to pharmacy electronically.  

## 2015-01-25 ENCOUNTER — Other Ambulatory Visit: Payer: Self-pay | Admitting: Cardiovascular Disease

## 2015-01-25 DIAGNOSIS — I6523 Occlusion and stenosis of bilateral carotid arteries: Secondary | ICD-10-CM

## 2015-02-06 DIAGNOSIS — L821 Other seborrheic keratosis: Secondary | ICD-10-CM | POA: Diagnosis not present

## 2015-02-06 DIAGNOSIS — C44319 Basal cell carcinoma of skin of other parts of face: Secondary | ICD-10-CM | POA: Diagnosis not present

## 2015-02-06 DIAGNOSIS — D485 Neoplasm of uncertain behavior of skin: Secondary | ICD-10-CM | POA: Diagnosis not present

## 2015-02-08 ENCOUNTER — Ambulatory Visit (HOSPITAL_COMMUNITY)
Admission: RE | Admit: 2015-02-08 | Discharge: 2015-02-08 | Disposition: A | Discharge status: Home / Self Care | Payer: PPO | Source: Ambulatory Visit | Attending: Internal Medicine | Admitting: Internal Medicine

## 2015-02-08 DIAGNOSIS — R0989 Other specified symptoms and signs involving the circulatory and respiratory systems: Secondary | ICD-10-CM | POA: Diagnosis not present

## 2015-02-08 DIAGNOSIS — I779 Disorder of arteries and arterioles, unspecified: Secondary | ICD-10-CM | POA: Insufficient documentation

## 2015-02-08 DIAGNOSIS — I10 Essential (primary) hypertension: Secondary | ICD-10-CM | POA: Diagnosis not present

## 2015-02-08 DIAGNOSIS — I6523 Occlusion and stenosis of bilateral carotid arteries: Secondary | ICD-10-CM | POA: Diagnosis not present

## 2015-02-08 DIAGNOSIS — E785 Hyperlipidemia, unspecified: Secondary | ICD-10-CM | POA: Diagnosis not present

## 2015-02-08 DIAGNOSIS — I25700 Atherosclerosis of coronary artery bypass graft(s), unspecified, with unstable angina pectoris: Secondary | ICD-10-CM | POA: Diagnosis not present

## 2015-02-08 DIAGNOSIS — Z79899 Other long term (current) drug therapy: Secondary | ICD-10-CM | POA: Diagnosis not present

## 2015-02-08 LAB — CBC
HEMATOCRIT: 39.1 % (ref 39.0–52.0)
HEMOGLOBIN: 13.1 g/dL (ref 13.0–17.0)
MCH: 30.3 pg (ref 26.0–34.0)
MCHC: 33.5 g/dL (ref 30.0–36.0)
MCV: 90.5 fL (ref 78.0–100.0)
MPV: 9.5 fL (ref 8.6–12.4)
Platelets: 207 10*3/uL (ref 150–400)
RBC: 4.32 MIL/uL (ref 4.22–5.81)
RDW: 13.3 % (ref 11.5–15.5)
WBC: 8.5 10*3/uL (ref 4.0–10.5)

## 2015-02-09 LAB — LIPID PANEL
Cholesterol: 127 mg/dL (ref 125–200)
HDL: 45 mg/dL (ref >=40)
LDL Cholesterol: 66 mg/dL (ref <130)
TRIGLYCERIDES: 79 mg/dL (ref <150)
Total CHOL/HDL Ratio: 2.8 Ratio (ref <=5.0)
VLDL: 16 mg/dL (ref <30)

## 2015-02-09 LAB — COMPREHENSIVE METABOLIC PANEL
ALBUMIN: 3.9 g/dL (ref 3.6–5.1)
ALT: 17 U/L (ref 9–46)
AST: 16 U/L (ref 10–35)
Alkaline Phosphatase: 65 U/L (ref 40–115)
BUN: 21 mg/dL (ref 7–25)
CALCIUM: 8.8 mg/dL (ref 8.6–10.3)
CHLORIDE: 102 mmol/L (ref 98–110)
CO2: 26 mmol/L (ref 20–31)
Creat: 1.87 mg/dL — ABNORMAL HIGH (ref 0.70–1.18)
Glucose, Bld: 97 mg/dL (ref 65–99)
Potassium: 4.7 mmol/L (ref 3.5–5.3)
Sodium: 140 mmol/L (ref 135–146)
Total Bilirubin: 0.5 mg/dL (ref 0.2–1.2)
Total Protein: 6.5 g/dL (ref 6.1–8.1)

## 2015-02-09 LAB — TSH: TSH: 4.611 u[IU]/mL — ABNORMAL HIGH (ref 0.350–4.500)

## 2015-02-26 ENCOUNTER — Telehealth: Payer: Self-pay | Admitting: *Deleted

## 2015-02-26 NOTE — Telephone Encounter (Signed)
Left message to return a call to discuss lab results and recommendations. 

## 2015-02-26 NOTE — Telephone Encounter (Signed)
-----   Message from [name], MD sent at 02/19/2015  7:59 AM EST ----- Cr is slightly worse; decrease lisinopril from 20 mg to 10 ; re-check bmet in 1 week

## 2015-02-28 ENCOUNTER — Telehealth: Payer: Self-pay | Admitting: Cardiovascular Disease

## 2015-02-28 DIAGNOSIS — Z79899 Other long term (current) drug therapy: Secondary | ICD-10-CM

## 2015-02-28 MED ORDER — LISINOPRIL 5 MG PO TABS: 5.0000 mg | ORAL_TABLET | Freq: Every day | ORAL | Status: DC

## 2015-02-28 NOTE — Telephone Encounter (Signed)
Called pt, LMTCB.  Pt needs to have instruction to cut lisinopril to 5mg  daily and to recheck BMET in 1 week. May need Rx sent for new dose.

## 2015-02-28 NOTE — Telephone Encounter (Signed)
New message      Returning a call to the nurse from [facility]

## 2015-02-28 NOTE — Telephone Encounter (Signed)
Cut lisinopril to 5 mg please

## 2015-02-28 NOTE — Telephone Encounter (Signed)
Instructions for medication change given. Rx sent to pharmacy. BMET order for [facility] in system, instructed for patient to return for labwork in 1 week.  Understanding verbalized, no questions from caller.

## 2015-02-28 NOTE — Telephone Encounter (Signed)
Dr. Evette Georges instructions were to cut lisinopril from 20mg  to 10mg  and recheck BMET to see if Creatinine improves  Called pt to give instructions - he is already on 10mg  daily of lisinopril. Informed him I would check to see if better to cut lisinopril to 5mg  daily or hold.

## 2015-02-28 NOTE — Telephone Encounter (Signed)
F/u  Pt wife returning phone clal. Please call back and discuss.

## 2015-03-08 DIAGNOSIS — Z79899 Other long term (current) drug therapy: Secondary | ICD-10-CM | POA: Diagnosis not present

## 2015-03-09 LAB — BASIC METABOLIC PANEL
BUN: 14 mg/dL (ref 7–25)
CHLORIDE: 102 mmol/L (ref 98–110)
CO2: 28 mmol/L (ref 20–31)
Calcium: 9 mg/dL (ref 8.6–10.3)
Creat: 1.71 mg/dL — ABNORMAL HIGH (ref 0.70–1.18)
GLUCOSE: 103 mg/dL — AB (ref 65–99)
Potassium: 4.6 mmol/L (ref 3.5–5.3)
SODIUM: 137 mmol/L (ref 135–146)

## 2015-03-12 ENCOUNTER — Encounter: Payer: Self-pay | Admitting: *Deleted

## 2015-03-12 ENCOUNTER — Telehealth: Payer: Self-pay | Admitting: Cardiovascular Disease

## 2015-03-12 ENCOUNTER — Telehealth: Payer: Self-pay | Admitting: *Deleted

## 2015-03-12 NOTE — Telephone Encounter (Signed)
Returning your call. °

## 2015-03-12 NOTE — Telephone Encounter (Signed)
-----   Message from [name], MD sent at 02/19/2015  7:59 AM EST ----- Cr is slightly worse; decrease lisinopril from 20 mg to 10 ; re-check bmet in 1 week

## 2015-03-12 NOTE — Telephone Encounter (Signed)
Left message to return a call to discuss lab and carotid results.

## 2015-03-14 NOTE — Telephone Encounter (Signed)
Informed patient of results and recommendations. 

## 2015-03-14 NOTE — Telephone Encounter (Signed)
-----   Message from [name], MD sent at 02/18/2015  2:37 PM EST ----- Heterogeneous plaque, bilaterally. Essentially stable RICA velocities, now in 60-79% range. Stable LICA velocities, now in 40-59% range. >50% ECA stenosis, bilaterally. Normal subclavian arteries, bilaterally. Patent vertebral arteries with antegrade flow.  F/U 1 year.

## 2015-03-14 NOTE — Telephone Encounter (Signed)
Follow up ° ° ° ° ° °Returning a call to the nurse to get lab results °

## 2015-04-05 DIAGNOSIS — Z0000 Encounter for general adult medical examination without abnormal findings: Secondary | ICD-10-CM | POA: Diagnosis not present

## 2015-04-05 DIAGNOSIS — J209 Acute bronchitis, unspecified: Secondary | ICD-10-CM | POA: Diagnosis not present

## 2015-04-13 ENCOUNTER — Telehealth: Payer: Self-pay | Admitting: Cardiovascular Disease

## 2015-04-13 NOTE — Telephone Encounter (Signed)
New message   Pt is calling for appt and he do not want too see a PA

## 2015-05-15 DIAGNOSIS — C44319 Basal cell carcinoma of skin of other parts of face: Secondary | ICD-10-CM | POA: Diagnosis not present

## 2015-06-09 ENCOUNTER — Other Ambulatory Visit: Payer: Self-pay | Admitting: Cardiovascular Disease

## 2015-06-11 ENCOUNTER — Other Ambulatory Visit: Payer: Self-pay | Admitting: Cardiovascular Disease

## 2015-06-12 NOTE — Telephone Encounter (Signed)
Rx has been sent to the pharmacy electronically. ° °

## 2015-06-18 ENCOUNTER — Other Ambulatory Visit: Payer: Self-pay | Admitting: Cardiovascular Disease

## 2015-06-18 NOTE — Telephone Encounter (Signed)
Rx(s) sent to pharmacy electronically.  

## 2015-07-14 ENCOUNTER — Other Ambulatory Visit: Payer: Self-pay | Admitting: Cardiovascular Disease

## 2015-08-17 ENCOUNTER — Other Ambulatory Visit: Payer: Self-pay | Admitting: Cardiovascular Disease

## 2015-08-17 NOTE — Telephone Encounter (Signed)
Rx(s) sent to pharmacy electronically.  

## 2015-09-14 ENCOUNTER — Ambulatory Visit (INDEPENDENT_AMBULATORY_CARE_PROVIDER_SITE_OTHER): Payer: PPO | Admitting: Cardiovascular Disease

## 2015-09-14 ENCOUNTER — Encounter (INDEPENDENT_AMBULATORY_CARE_PROVIDER_SITE_OTHER): Payer: Self-pay

## 2015-09-14 ENCOUNTER — Encounter: Payer: Self-pay | Admitting: Cardiovascular Disease

## 2015-09-14 VITALS — BP 120/62 | HR 62 | Ht 68.5 in | Wt 183.2 lb

## 2015-09-14 DIAGNOSIS — I723 Aneurysm of iliac artery: Secondary | ICD-10-CM

## 2015-09-14 DIAGNOSIS — I779 Disorder of arteries and arterioles, unspecified: Secondary | ICD-10-CM

## 2015-09-14 DIAGNOSIS — Z79899 Other long term (current) drug therapy: Secondary | ICD-10-CM

## 2015-09-14 DIAGNOSIS — I10 Essential (primary) hypertension: Secondary | ICD-10-CM | POA: Diagnosis not present

## 2015-09-14 DIAGNOSIS — E785 Hyperlipidemia, unspecified: Secondary | ICD-10-CM

## 2015-09-14 DIAGNOSIS — N289 Disorder of kidney and ureter, unspecified: Secondary | ICD-10-CM

## 2015-09-14 DIAGNOSIS — I739 Peripheral vascular disease, unspecified: Secondary | ICD-10-CM

## 2015-09-14 DIAGNOSIS — I2510 Atherosclerotic heart disease of native coronary artery without angina pectoris: Secondary | ICD-10-CM | POA: Diagnosis not present

## 2015-09-14 LAB — COMPREHENSIVE METABOLIC PANEL
ALT: 30 U/L (ref 9–46)
AST: 29 U/L (ref 10–35)
Albumin: 4.8 g/dL (ref 3.6–5.1)
Alkaline Phosphatase: 76 U/L (ref 40–115)
BUN: 12 mg/dL (ref 7–25)
CALCIUM: 9.4 mg/dL (ref 8.6–10.3)
CHLORIDE: 97 mmol/L — AB (ref 98–110)
CO2: 27 mmol/L (ref 20–31)
Creat: 1.77 mg/dL — ABNORMAL HIGH (ref 0.70–1.18)
Glucose, Bld: 106 mg/dL — ABNORMAL HIGH (ref 65–99)
POTASSIUM: 4.6 mmol/L (ref 3.5–5.3)
Sodium: 134 mmol/L — ABNORMAL LOW (ref 135–146)
TOTAL PROTEIN: 7.7 g/dL (ref 6.1–8.1)
Total Bilirubin: 0.5 mg/dL (ref 0.2–1.2)

## 2015-09-14 LAB — CBC
HCT: 42.6 % (ref 38.5–50.0)
HEMOGLOBIN: 14.5 g/dL (ref 13.2–17.1)
MCH: 30.7 pg (ref 27.0–33.0)
MCHC: 34 g/dL (ref 32.0–36.0)
MCV: 90.1 fL (ref 80.0–100.0)
MPV: 9.2 fL (ref 7.5–12.5)
Platelets: 231 10*3/uL (ref 140–400)
RBC: 4.73 MIL/uL (ref 4.20–5.80)
RDW: 13.3 % (ref 11.0–15.0)
WBC: 6.9 10*3/uL (ref 3.8–10.8)

## 2015-09-14 LAB — LIPID PANEL
CHOL/HDL RATIO: 3 ratio (ref <=5.0)
Cholesterol: 148 mg/dL (ref 125–200)
HDL: 49 mg/dL (ref >=40)
LDL CALC: 67 mg/dL (ref <130)
TRIGLYCERIDES: 159 mg/dL — AB (ref <150)
VLDL: 32 mg/dL — AB (ref <30)

## 2015-09-14 NOTE — Patient Instructions (Signed)
Your physician recommends that you return for lab work TODAY.   Your physician wants you to follow-up in: 6 months or sooner if needed. You will receive a reminder letter in the mail two months in advance. If you don't receive a letter, please call our office to schedule the follow-up appointment.   If you need a refill on your cardiac medications before your next appointment, please call your pharmacy.     

## 2015-09-15 ENCOUNTER — Encounter: Payer: Self-pay | Admitting: Cardiovascular Disease

## 2015-09-15 LAB — TSH: TSH: 7.26 mIU/L — ABNORMAL HIGH (ref 0.40–4.50)

## 2015-09-17 NOTE — Progress Notes (Signed)
Patient ID: Frederick Kirby, male   DOB: [birth year], 80 y.o.   MRN: [id number]     HPI: Frederick Kirby is a 80 y.o. male who presents for an 45 month cardiology evaluation.  Frederick Kirby has a history of coronary as well as peripheral vascular disease. In 1994 he underwent CABG revascularization surgery( LIMA to the LAD, sequential vein to the OM1 and OM2, sequential vein to the PDA and PLA vessel). In April 2005, a 2.5x24 mm Taxus stent was inserted into the vein graft supplying the ramus intermediate vessel. At that time, his RCA graft was occluded and he also had an occluded graft to the marginal vessel but had a patent LIMA to the LAD.   Additional problems include mixed hyperlipidemia, documented iliac artery aneurysm, mild renal insufficiency, hypertension, as well as hyperlipidemia. He's also had instances with stage II hypertension requiring medication adjustment. He has a history of renal insufficiency  which did improve with reduction of his lisinopril from 20 mg to 10 mg and further titration his metoprolol extended release 75 mg.    Has a history of hyperlipidemia on Crestor 40 mg daily , and lipid studies in March 2015 revealed cholesterol 131, triglycerides 153, HDL 47, LDL 63.  In March 2015 abdominal aortic ultrasound demonstrated  mild amount of nonhemodynamically significant plaque.  There also is mild aneurysmal dilatation of his right common iliac artery , which measured 2.0 x 2.2 cm, unchanged from two years previously.  He has carotid disease with his last carotid duplex study in January 2017 demonstrating a 60-79% right internal carotid stenosis and 40-59% left internal carotid artery stenosis.   He has a history of chronic kidney disease, stage III.  His last serum creatinine was 1.71  Since I last saw him he has continued to remain stable from a cardiac perspective.  He has remained active.  He denies any episodes of chest pain, PND or orthopnea. In the past hehad been started on  irbesartan, but apparently did not tolerate this well and resumed lisinopril at 10 mg daily in addition to amlodipine 10 mg and Toprol-XL 75 mg for hyperlipidemia.  He continues to be on Crestor 40 mg and fish oil for hyperlipidemia.  He also is on omeprazole for GERD and continues to take Plavix. He does not take ASA do to a history of ulcer disease.  He presents for follow-up evaluation.  Past Medical History:  Diagnosis Date  . COPD (chronic obstructive pulmonary disease) ([place])   . Coronary artery disease   . Hyperlipidemia   . Hypertension   . Mild renal insufficiency   . Myocardial infarction ([place])   . S/P CABG (coronary artery bypass graft) 1994  . Stroke [facility])     Past Surgical History:  Procedure Laterality Date  . CAROTID DOPPLER  12/2011   Right Bulb/Prox ICA 50-69% diameter reduction; Left Bulb/Prox ICA 0-49% diameter reduction  . CORONARY ANGIOPLASTY WITH STENT PLACEMENT  04/28/2003   L Cfx with 99% prox lesion in AV groove; patent SVG to ramus intermedius w/ 95% distal lesion; SVG to OM totally occluded; SVG to RCA totally occluded; native RCA occluded in prox region w/collaterals from LAD into PDA; Taxus 2.5x17m stent to distal VG to ramus (Dr. RGerrie Nordmann  . CORONARY ARTERY BYPASS GRAFT  01/1992   LIMA-LAD & diagonal, SVG to diagonal, SVG to OM1 & OM2, VG to PDA & PLA (Dr. WRedmond Pulling  . NM MYOCAR PERF WALL MOTION  11/2011   lexiscan -  normal study, EF 80% post-stress  . RENAL DOPPLER  12/2011   Right CIA with aneursymal dilatation 2.3x3.1  . TRANSTHORACIC ECHOCARDIOGRAM  10/2008   EF 50-55%, borderline conc LVH, mild posterior wall hypokinesis; mild mitral annular calcification, prolapse of anterior leaflets, mild MR; RVSP 30-53mHg; mild TR; mild calcification of AV leaflets    Allergies  Allergen Reactions  . Niaspan [Niacin Er]     Turned red & his skin burned    Current Outpatient Prescriptions  Medication Sig Dispense Refill  . clopidogrel (PLAVIX) 75 MG tablet  TAKE 1 TABLET BY MOUTH EVERY DAY 30 tablet 6  . FOLBIC 2.5-25-2 MG TABS tablet TAKE 1 TABLET BY MOUTH DAILY. 30 tablet 10  . lisinopril (PRINIVIL,ZESTRIL) 5 MG tablet TAKE 1 TABLET BY MOUTH DAILY 30 tablet 6  . metoprolol succinate (TOPROL-XL) 50 MG 24 hr tablet Take 50 mg by mouth daily. Take 1 & 1/4 tablet    . Omega-3 Fatty Acids (FISH OIL) 1200 MG CAPS Take 1 capsule by mouth 2 (two) times daily.    .Marland Kitchenomeprazole (PRILOSEC) 40 MG capsule TAKE 1 CAPSULE (40 MG TOTAL) BY MOUTH DAILY. 30 capsule 5  . rosuvastatin (CRESTOR) 40 MG tablet TAKE 1 TABLET BY MOUTH EVERY DAY 30 tablet 5  . amLODipine (NORVASC) 10 MG tablet TAKE 1 TABLET (10 MG TOTAL) BY MOUTH DAILY. 30 tablet 2   No current facility-administered medications for this visit.     Social History   Social History  . Marital status: Single    Spouse name: N/A  . Number of children: N/A  . Years of education: N/A   Occupational History  . Not on file.   Social History Main Topics  . Smoking status: Former Smoker    Quit date: 01/13/2005  . Smokeless tobacco: Never Used  . Alcohol use No  . Drug use: No  . Sexual activity: Not Currently   Other Topics Concern  . Not on file   Social History Narrative  . No narrative on file   Social history is notable that he is married. He is over 18 grandchildren and at least 7 great grandchildren. He quit smoking 8 years ago. He remains active the  History reviewed. No pertinent family history.  ROS General: Negative; No fevers, chills, or night sweats;  HEENT: [place] for sinus drainage; No changes in vision or hearing, sinus congestion, difficulty swallowing Pulmonary: Positive for questionable wheezing.; No cough, shortness of breath, hemoptysis Cardiovascular: see HPI; No chest pain, presyncope, syncope, palpatations; occasional left ankle swelling GI: Negative; No nausea, vomiting, diarrhea, or abdominal pain GU: Negative; No dysuria, hematuria, or difficulty  voiding Musculoskeletal: Negative; no myalgias, joint pain, or weakness Hematologic/Oncology: Negative; no easy bruising, bleeding Endocrine: Negative; no heat/cold intolerance; no diabetes Neuro: Negative; no changes in balance, headaches Skin: Negative; No rashes or skin lesions Psychiatric: Negative; No behavioral problems, depression Sleep: Negative; No snoring, daytime sleepiness, hypersomnolence, bruxism, restless legs, hypnogognic hallucinations, no cataplexy Other comprehensive 14 point system review is negative.   BP 120/62 (BP Location: Left Arm, Patient Position: Sitting, Cuff Size: Normal)   Pulse 62   Ht 5' 8.5" (1.74 m)   Wt 183 lb 4 oz (83.1 kg)   BMI 27.46 kg/m   Repeat blood pressure 128/64.  Wt Readings from Last 3 Encounters:  09/14/15 183 lb 4 oz (83.1 kg)  10/17/14 183 lb 1.6 oz (83.1 kg)  12/04/13 184 lb (83.5 kg)   General: Alert, oriented, no distress.  Skin: normal turgor, no rashes HEENT: Normocephalic, atraumatic. Pupils round and reactive; sclera anicteric;no lid lag.  Nose without nasal septal hypertrophy Mouth/Parynx benign; Mallinpatti scale 3 Neck: No JVD, soft right carotid bruit with normal carotid upstroke Lungs: clear to ausculatation and percussion; no wheezing or rales Chest wall: Nontender to palpation Heart: RRR, s1 s2 normal  1/6 systolic murmur; no S3 or S4 gallop.  No diastolic murmur.  No rubs, thrills or heaves. Abdomen: soft, nontender; no hepatosplenomehaly, BS+; abdominal aorta nontender and not dilated by palpation. Back: No CVA tenderness Pulses 2+ Extremities: no clubbing cyanosis or edema, Homan's sign negative  Neurologic: grossly nonfocal Psychologic: normal affect and mood.  ECG (independently read by me): Sinus rhythm with first degree AV block with a PR interval of 270 ms.  QTc interval is normal.  There are no significant ST-T changes.  November 2016 ECG (independently read by me): Sinus bradycardia 58 bpm.,   First-degree AV block with increased PR interval at 272 ms.  ECG (independently read by me): normal sinus rhythm at 60 bpm.  First-degree AV block.  No significant ST-T changes.  QTc interval normal.  May 2015 ECG (independently read by me): Normal sinus rhythm at 62 beats per minute.  First degree AV block.  Prior ECG: Normal sinus rhythm at 69 beats per minute. First degree AV block with PR interval 236 ms. QTc interval normal at 420 ms.  LABS: BMP Latest Ref Rng & Units 09/14/2015 03/08/2015 02/08/2015  Glucose 65 - 99 mg/dL 106(H) 103(H) 97  BUN 7 - 25 mg/dL _0 Creatinine 0.70 - 1.18 mg/dL 1.77(H) 1.71(H) 1.87(H)  Sodium 135 - 146 mmol/L 134(L) 137 140  Potassium 3.5 - 5.3 mmol/L 4.6 4.6 4.7  Chloride 98 - 110 mmol/L 97(L) 102 102  CO2 20 - 31 mmol/L _1 Calcium 8.6 - 10.3 mg/dL 9.4 9.0 8.8   Hepatic Function Latest Ref Rng & Units 09/14/2015 02/08/2015 03/30/2013  Total Protein 6.1 - 8.1 g/dL 7.7 6.5 7.0  Albumin 3.6 - 5.1 g/dL 4.8 3.9 4.3  AST 10 - 35 U/L _2 ALT 9 - 46 U/L 30 17 33  Alk Phosphatase 40 - 115 U/L 76 65 89  Total Bilirubin 0.2 - 1.2 mg/dL 0.5 0.5 0.5   CBC Latest Ref Rng & Units 09/14/2015 02/08/2015 12/04/2013  WBC 3.8 - 10.8 K/uL 6.9 8.5 12.4(H)  Hemoglobin 13.2 - 17.1 g/dL 14.5 13.1 13.0  Hematocrit 38.5 - 50.0 % 42.6 39.1 38.7(L)  Platelets 140 - 400 K/uL 231 207 215   Lab Results  Component Value Date   MCV 90.1 09/14/2015   MCV 90.5 02/08/2015   MCV 88.6 12/04/2013   Lab Results  Component Value Date   TSH 7.26 (H) 09/14/2015  No results found for: HGBA1C   Lipid Panel     Component Value Date/Time   CHOL 148 09/14/2015 0854   TRIG 159 (H) 09/14/2015 0854   HDL 49 09/14/2015 0854   CHOLHDL 3.0 09/14/2015 0854   VLDL 32 (H) 09/14/2015 0854   LDLCALC 67 09/14/2015 0854    RADIOLOGY: No results found.    ASSESSMENT AND PLAN: Mr. Taariq Leitz is a 80 year old Caucasian male who is 23 years status post CABG revascularization  surgery with LIMA to his LAD, sequential graft to the OM1 and OM2, and sequential graft to the PDA and PLA vessel. He is 12 years since undergoing intervention to the vein graft supplying the intermediate  vessel.  At that time he had an occluded graft to the RCA, as well as an occluded graft to the marginal vessel.  Presently, he seems to be doing well on his current medical regimen. He continues to be on anti- platelet therapy and is tolerating this well.  His blood pressure today is stable on his current regimen consisting of amlodipine 10 mg, lisinopril 5 mg, Toprol-XL 62.5 mg.  He continues be on Plavix antiplatelet benefit but does not take aspirin was history of ulcer disease.  He continues to be on omeprazole.  His lipids are aggressively being treated with LDL 67 on Crestor 40 mg daily.  He has chronic kidney disease.  Laboratory was rechecked today with shows creatinine 1.77 which is relatively stable from his previous evaluations.  I reviewed his most recent carotid duplex imaging does show bilateral carotid disease.  He has first-degree heart block, but this is stable.  He will continue his current medical regimen.  I will see him in 6 months for cardiology reevaluation.  Time spent: 25 minutes  [name], MD, [facility]  09/17/2015 9:41 AM

## 2015-09-21 NOTE — Telephone Encounter (Signed)
Please close Encounter °

## 2015-10-08 DIAGNOSIS — Z23 Encounter for immunization: Secondary | ICD-10-CM | POA: Diagnosis not present

## 2015-10-11 ENCOUNTER — Other Ambulatory Visit: Payer: Self-pay | Admitting: Cardiovascular Disease

## 2015-10-15 ENCOUNTER — Other Ambulatory Visit: Payer: Self-pay | Admitting: Cardiovascular Disease

## 2015-10-15 NOTE — Telephone Encounter (Signed)
REFILL 

## 2015-10-16 ENCOUNTER — Other Ambulatory Visit: Payer: Self-pay

## 2015-10-16 MED ORDER — ROSUVASTATIN CALCIUM 40 MG PO TABS: 40.0000 mg | ORAL_TABLET | Freq: Every day | ORAL | 5 refills | Status: DC

## 2015-10-16 MED ORDER — OMEPRAZOLE 40 MG PO CPDR: 40.0000 mg | DELAYED_RELEASE_CAPSULE | Freq: Every day | ORAL | 5 refills | Status: DC

## 2015-10-16 MED ORDER — METOPROLOL SUCCINATE ER 50 MG PO TB24: 50.0000 mg | ORAL_TABLET | Freq: Every day | ORAL | 3 refills | Status: DC

## 2015-10-19 ENCOUNTER — Telehealth: Payer: Self-pay | Admitting: Cardiovascular Disease

## 2015-10-19 NOTE — Telephone Encounter (Signed)
Returned call and verified Rx instructions w pharmacist, who verbalized understanding.

## 2015-10-19 NOTE — Telephone Encounter (Signed)
New message    Pharmacist following up about the pts rx. Wants to verify the directions on the metoprolol. Please call.

## 2015-10-26 ENCOUNTER — Other Ambulatory Visit: Payer: Self-pay

## 2015-10-26 MED ORDER — METOPROLOL SUCCINATE ER 50 MG PO TB24: 50.0000 mg | ORAL_TABLET | Freq: Every day | ORAL | 6 refills | Status: DC

## 2015-10-26 MED ORDER — ROSUVASTATIN CALCIUM 40 MG PO TABS: 40.0000 mg | ORAL_TABLET | Freq: Every day | ORAL | 1 refills | Status: DC

## 2015-10-26 MED ORDER — ROSUVASTATIN CALCIUM 40 MG PO TABS: 40.0000 mg | ORAL_TABLET | Freq: Every day | ORAL | 3 refills | Status: DC

## 2015-10-26 MED ORDER — ROSUVASTATIN CALCIUM 40 MG PO TABS: 40.0000 mg | ORAL_TABLET | Freq: Every day | ORAL | 6 refills | Status: DC

## 2015-10-26 MED ORDER — OMEPRAZOLE 40 MG PO CPDR: 40.0000 mg | DELAYED_RELEASE_CAPSULE | Freq: Every day | ORAL | 6 refills | Status: DC

## 2015-10-26 MED ORDER — OMEPRAZOLE 40 MG PO CPDR: 40.0000 mg | DELAYED_RELEASE_CAPSULE | Freq: Every day | ORAL | 11 refills | Status: DC

## 2015-11-02 ENCOUNTER — Other Ambulatory Visit: Payer: Self-pay

## 2015-11-02 MED ORDER — LISINOPRIL 5 MG PO TABS: 5.0000 mg | ORAL_TABLET | Freq: Every day | ORAL | 3 refills | Status: DC

## 2015-11-07 ENCOUNTER — Telehealth: Payer: Self-pay | Admitting: *Deleted

## 2015-11-07 NOTE — Telephone Encounter (Signed)
-----   Message from [name], MD sent at 10/21/2015  8:18 PM EDT ----- Cr still elevated but better than January, decrease lisinopril  fro 5 to2.5 mg.  Mild in TSH;  Recheck TSH, free T3 and Free T4 in 2 weeks with Bmet

## 2015-11-09 ENCOUNTER — Telehealth: Payer: Self-pay | Admitting: Cardiovascular Disease

## 2015-11-09 NOTE — Telephone Encounter (Signed)
Returning call to Frederick Kirby was out and just got the message-pls call

## 2015-11-16 ENCOUNTER — Other Ambulatory Visit: Payer: Self-pay | Admitting: *Deleted

## 2015-11-16 DIAGNOSIS — R7989 Other specified abnormal findings of blood chemistry: Secondary | ICD-10-CM

## 2015-11-16 DIAGNOSIS — J129 Viral pneumonia, unspecified: Secondary | ICD-10-CM | POA: Diagnosis not present

## 2015-11-16 DIAGNOSIS — N289 Disorder of kidney and ureter, unspecified: Secondary | ICD-10-CM

## 2015-11-16 MED ORDER — LISINOPRIL 2.5 MG PO TABS: 2.5000 mg | ORAL_TABLET | Freq: Every day | ORAL | 6 refills | Status: DC

## 2015-12-24 DIAGNOSIS — N289 Disorder of kidney and ureter, unspecified: Secondary | ICD-10-CM | POA: Diagnosis not present

## 2015-12-24 DIAGNOSIS — R946 Abnormal results of thyroid function studies: Secondary | ICD-10-CM | POA: Diagnosis not present

## 2015-12-25 LAB — T3, FREE: T3, Free: 3.4 pg/mL (ref 2.3–4.2)

## 2015-12-25 LAB — BASIC METABOLIC PANEL
BUN: 15 mg/dL (ref 7–25)
CALCIUM: 9.3 mg/dL (ref 8.6–10.3)
CO2: 27 mmol/L (ref 20–31)
Chloride: 102 mmol/L (ref 98–110)
Creat: 1.66 mg/dL — ABNORMAL HIGH (ref 0.70–1.18)
GLUCOSE: 105 mg/dL — AB (ref 65–99)
Potassium: 4.5 mmol/L (ref 3.5–5.3)
SODIUM: 137 mmol/L (ref 135–146)

## 2015-12-25 LAB — TSH: TSH: 7.95 m[IU]/L — AB (ref 0.40–4.50)

## 2015-12-25 LAB — T4, FREE: Free T4: 1.2 ng/dL (ref 0.8–1.8)

## 2015-12-29 ENCOUNTER — Other Ambulatory Visit: Payer: Self-pay | Admitting: Cardiovascular Disease

## 2015-12-31 ENCOUNTER — Other Ambulatory Visit: Payer: Self-pay

## 2015-12-31 MED ORDER — METOPROLOL SUCCINATE ER 25 MG PO TB24: 75.0000 mg | ORAL_TABLET | Freq: Every day | ORAL | 1 refills | Status: DC

## 2016-01-09 ENCOUNTER — Other Ambulatory Visit: Payer: Self-pay

## 2016-01-09 MED ORDER — LISINOPRIL 2.5 MG PO TABS: 2.5000 mg | ORAL_TABLET | Freq: Every day | ORAL | 9 refills | Status: DC

## 2016-01-09 MED ORDER — METOPROLOL SUCCINATE ER 25 MG PO TB24: 75.0000 mg | ORAL_TABLET | Freq: Every day | ORAL | 3 refills | Status: DC

## 2016-02-20 ENCOUNTER — Ambulatory Visit (HOSPITAL_COMMUNITY)
Admission: RE | Admit: 2016-02-20 | Discharge: 2016-02-20 | Disposition: A | Discharge status: Home / Self Care | Payer: PPO | Source: Ambulatory Visit | Attending: Cardiovascular Disease | Admitting: Cardiovascular Disease

## 2016-02-20 ENCOUNTER — Other Ambulatory Visit: Payer: Self-pay | Admitting: Cardiovascular Disease

## 2016-02-20 DIAGNOSIS — I6523 Occlusion and stenosis of bilateral carotid arteries: Secondary | ICD-10-CM | POA: Diagnosis not present

## 2016-02-20 DIAGNOSIS — R0989 Other specified symptoms and signs involving the circulatory and respiratory systems: Secondary | ICD-10-CM

## 2016-02-25 ENCOUNTER — Telehealth: Payer: Self-pay | Admitting: *Deleted

## 2016-02-25 NOTE — Telephone Encounter (Signed)
Patient notified of these results and recommendations. Patient advised to keep upcoming March 5th appointment.

## 2016-02-25 NOTE — Telephone Encounter (Signed)
-----   Message from [name], MD sent at 02/24/2016  5:39 PM EST ----- Bilateral carotid plaque.  Velocities are improved from one year previously.  Normal antegrade flow in the left vertebral but apparent flow in the right vertebral.  Medical therapy.

## 2016-02-27 ENCOUNTER — Other Ambulatory Visit: Payer: Self-pay | Admitting: Cardiovascular Disease

## 2016-02-27 MED ORDER — CLOPIDOGREL BISULFATE 75 MG PO TABS: 75.0000 mg | ORAL_TABLET | Freq: Every day | ORAL | 0 refills | Status: DC

## 2016-02-27 NOTE — Telephone Encounter (Signed)
Rx(s) sent to pharmacy electronically.  

## 2016-03-17 ENCOUNTER — Encounter: Payer: Self-pay | Admitting: Cardiovascular Disease

## 2016-03-17 ENCOUNTER — Ambulatory Visit (INDEPENDENT_AMBULATORY_CARE_PROVIDER_SITE_OTHER): Payer: PPO | Admitting: Cardiovascular Disease

## 2016-03-17 VITALS — BP 132/70 | HR 78 | Ht 68.0 in | Wt 183.0 lb

## 2016-03-17 DIAGNOSIS — I739 Peripheral vascular disease, unspecified: Secondary | ICD-10-CM

## 2016-03-17 DIAGNOSIS — I2510 Atherosclerotic heart disease of native coronary artery without angina pectoris: Secondary | ICD-10-CM

## 2016-03-17 DIAGNOSIS — I10 Essential (primary) hypertension: Secondary | ICD-10-CM | POA: Diagnosis not present

## 2016-03-17 DIAGNOSIS — N289 Disorder of kidney and ureter, unspecified: Secondary | ICD-10-CM | POA: Diagnosis not present

## 2016-03-17 DIAGNOSIS — Z79899 Other long term (current) drug therapy: Secondary | ICD-10-CM

## 2016-03-17 DIAGNOSIS — I779 Disorder of arteries and arterioles, unspecified: Secondary | ICD-10-CM

## 2016-03-17 DIAGNOSIS — R0989 Other specified symptoms and signs involving the circulatory and respiratory systems: Secondary | ICD-10-CM | POA: Diagnosis not present

## 2016-03-17 DIAGNOSIS — E785 Hyperlipidemia, unspecified: Secondary | ICD-10-CM | POA: Diagnosis not present

## 2016-03-17 NOTE — Patient Instructions (Signed)
Your physician recommends that you schedule a follow-up appointment and labs in 6 months or sooner if needed.

## 2016-03-17 NOTE — Progress Notes (Signed)
Patient ID: Frederick Kirby, male   DOB: 17-Aug-1935, 81 y.o.   MRN: [id number]     HPI: Frederick Kirby is a 81 y.o. male who presents for an 6 month cardiology evaluation.  Frederick Kirby has a history of coronary as well as peripheral vascular disease. In 1994 he underwent CABG revascularization surgery( LIMA to the LAD, sequential vein to the OM1 and OM2, sequential vein to the PDA and PLA vessel). In April 2005, a 2.5x24 mm Taxus stent was inserted into the vein graft supplying the ramus intermediate vessel. At that time, his RCA graft was occluded and he also had an occluded graft to the marginal vessel but had a patent LIMA to the LAD.   Additional problems include mixed hyperlipidemia, documented iliac artery aneurysm, mild renal insufficiency, hypertension, as well as hyperlipidemia. He's also had instances with stage II hypertension requiring medication adjustment. He has a history of renal insufficiency  which did improve with reduction of his lisinopril from 20 mg to 10 mg and further titration his metoprolol extended release 75 mg.    Has a history of hyperlipidemia on Crestor 40 mg daily , and lipid studies in March 2015 revealed cholesterol 131, triglycerides 153, HDL 47, LDL 63.  In March 2015 abdominal aortic ultrasound demonstrated  mild amount of nonhemodynamically significant plaque.  There also is mild aneurysmal dilatation of his right common iliac artery , which measured 2.0 x 2.2 cm, unchanged from two years previously.  He has carotid disease with a carotid duplex study in January 2017 demonstrating a 60-79% right internal carotid stenosis and 40-59% left internal carotid artery stenosis.   He has a history of chronic kidney disease, stage III.  His last serum creatinine was 1.71  Since I last saw him he has continued to remain stable from a cardiac perspective.  He underwent a follow-up carotid study on February 2018 which actually was improved from previously and now showed 40-59%  right internal carotid stenosis in the left internal carotid stenosis is in the 1-39% range.  He has remained active.  He denies any episodes of chest pain, PND or orthopnea. In the past he had been started on irbesartan, but apparently did not tolerate this well .  At present he is on lisinopril at 2.5 mg mg daily in addition to amlodipine 10 mg and Toprol-XL 75 mg for hyperlipidemia.  He continues to be on Crestor 40 mg and fish oil for hyperlipidemia.  He also is on omeprazole for GERD and continues to take Plavix. He does not take ASA do to a history of ulcer disease.  He presents for follow-up evaluation.  Past Medical History:  Diagnosis Date  . COPD (chronic obstructive pulmonary disease) ([place])   . Coronary artery disease   . Hyperlipidemia   . Hypertension   . Mild renal insufficiency   . Myocardial infarction   . S/P CABG (coronary artery bypass graft) 1994  . Stroke [facility])     Past Surgical History:  Procedure Laterality Date  . CAROTID DOPPLER  12/2011   Right Bulb/Prox ICA 50-69% diameter reduction; Left Bulb/Prox ICA 0-49% diameter reduction  . CORONARY ANGIOPLASTY WITH STENT PLACEMENT  04/28/2003   L Cfx with 99% prox lesion in AV groove; patent SVG to ramus intermedius w/ 95% distal lesion; SVG to OM totally occluded; SVG to RCA totally occluded; native RCA occluded in prox region w/collaterals from LAD into PDA; Taxus 2.5x57m stent to distal VG to ramus (Dr. RGerrie Nordmann  .  CORONARY ARTERY BYPASS GRAFT  01/1992   LIMA-LAD & diagonal, SVG to diagonal, SVG to OM1 & OM2, VG to PDA & PLA (Dr. Andrey Campanile)  . NM MYOCAR PERF WALL MOTION  11/2011   lexiscan - normal study, EF 80% post-stress  . RENAL DOPPLER  12/2011   Right CIA with aneursymal dilatation 2.3x3.1  . TRANSTHORACIC ECHOCARDIOGRAM  10/2008   EF 50-55%, borderline conc LVH, mild posterior wall hypokinesis; mild mitral annular calcification, prolapse of anterior leaflets, mild MR; RVSP 30-74mmHg; mild TR; mild calcification of  AV leaflets    Allergies  Allergen Reactions  . Niaspan [Niacin Er]     Turned red & his skin burned    Current Outpatient Prescriptions  Medication Sig Dispense Refill  . amLODipine (NORVASC) 10 MG tablet TAKE 1 TABLET (10 MG TOTAL) BY MOUTH DAILY. 30 tablet 6  . clopidogrel (PLAVIX) 75 MG tablet Take 1 tablet (75 mg total) by mouth daily. 90 tablet 0  . FOLBIC 2.5-25-2 MG TABS tablet TAKE 1 TABLET BY MOUTH DAILY. 30 tablet 6  . lisinopril (PRINIVIL,ZESTRIL) 2.5 MG tablet Take 1 tablet (2.5 mg total) by mouth daily. 30 tablet 9  . metoprolol succinate (TOPROL XL) 25 MG 24 hr tablet Take 3 tablets (75 mg total) by mouth daily. 90 tablet 3  . Omega-3 Fatty Acids (FISH OIL) 1200 MG CAPS Take 1 capsule by mouth 2 (two) times daily.    Marland Kitchen omeprazole (PRILOSEC) 40 MG capsule Take 1 capsule (40 mg total) by mouth daily. 30 capsule 11  . rosuvastatin (CRESTOR) 40 MG tablet Take 1 tablet (40 mg total) by mouth daily. 90 tablet 6   No current facility-administered medications for this visit.     Social History   Social History  . Marital status: Single    Spouse name: N/A  . Number of children: N/A  . Years of education: N/A   Occupational History  . Not on file.   Social History Main Topics  . Smoking status: Former Smoker    Quit date: 01/13/2005  . Smokeless tobacco: Never Used  . Alcohol use No  . Drug use: No  . Sexual activity: Not Currently   Other Topics Concern  . Not on file   Social History Narrative  . No narrative on file   Social history is notable that he is married. He is over 18 grandchildren and at least 7 great grandchildren. He quit smoking 8 years ago. He remains active the  Family History  Problem Relation Age of Onset  . Cancer Mother     ROS General: Negative; No fevers, chills, or night sweats;  HEENT: [place] for sinus drainage; No changes in vision or hearing, sinus congestion, difficulty swallowing Pulmonary: Positive for questionable wheezing.;  No cough, shortness of breath, hemoptysis Cardiovascular: see HPI; No chest pain, presyncope, syncope, palpatations; occasional left ankle swelling GI: Negative; No nausea, vomiting, diarrhea, or abdominal pain GU: Negative; No dysuria, hematuria, or difficulty voiding Musculoskeletal: Negative; no myalgias, joint pain, or weakness Hematologic/Oncology: Negative; no easy bruising, bleeding Endocrine: Negative; no heat/cold intolerance; no diabetes Neuro: Negative; no changes in balance, headaches Skin: Negative; No rashes or skin lesions Psychiatric: Negative; No behavioral problems, depression Sleep: Negative; No snoring, daytime sleepiness, hypersomnolence, bruxism, restless legs, hypnogognic hallucinations, no cataplexy Other comprehensive 14 point system review is negative.   BP 132/70   Pulse 78   Ht 5\' 8"  (1.727 m)   Wt 183 lb (83 kg)   BMI 27.83 kg/m  Repeat blood pressure 130/78  Wt Readings from Last 3 Encounters:  03/17/16 183 lb (83 kg)  09/14/15 183 lb 4 oz (83.1 kg)  10/17/14 183 lb 1.6 oz (83.1 kg)   General: Alert, oriented, no distress.  Skin: normal turgor, no rashes HEENT: Normocephalic, atraumatic. Pupils round and reactive; sclera anicteric;no lid lag.  Nose without nasal septal hypertrophy Mouth/Parynx benign; Mallinpatti scale 3 Neck: No JVD, soft right carotid bruit with normal carotid upstroke Lungs: clear to ausculatation and percussion; no wheezing or rales Chest wall: Nontender to palpation Heart: RRR, s1 s2 normal  1/6 systolic murmur; no S3 or S4 gallop.  No diastolic murmur.  No rubs, thrills or heaves. Abdomen: soft, nontender; no hepatosplenomehaly, BS+; abdominal aorta nontender and not dilated by palpation. Back: No CVA tenderness Pulses 2+ Extremities: no clubbing cyanosis or edema, Homan's sign negative  Neurologic: grossly nonfocal Psychologic: normal affect and mood.  ECG (independently read by me): NSR  At 78 with 1st degree AV  block  September 2017 ECG (independently read by me): Sinus rhythm with first degree AV block with a PR interval of 270 ms.  QTc interval is normal.  There are no significant ST-T changes.  November 2016 ECG (independently read by me): Sinus bradycardia 58 bpm.,  First-degree AV block with increased PR interval at 272 ms.  ECG (independently read by me): normal sinus rhythm at 60 bpm.  First-degree AV block.  No significant ST-T changes.  QTc interval normal.  May 2015 ECG (independently read by me): Normal sinus rhythm at 62 beats per minute.  First degree AV block.  Prior ECG: Normal sinus rhythm at 69 beats per minute. First degree AV block with PR interval 236 ms. QTc interval normal at 420 ms.  LABS: BMP Latest Ref Rng & Units 12/24/2015 09/14/2015 03/08/2015  Glucose 65 - 99 mg/dL 105(H) 106(H) 103(H)  BUN 7 - 25 mg/dL '15 12 14  '$ Creatinine 0.70 - 1.18 mg/dL 1.66(H) 1.77(H) 1.71(H)  Sodium 135 - 146 mmol/L 137 134(L) 137  Potassium 3.5 - 5.3 mmol/L 4.5 4.6 4.6  Chloride 98 - 110 mmol/L 102 97(L) 102  CO2 20 - 31 mmol/L '27 27 28  '$ Calcium 8.6 - 10.3 mg/dL 9.3 9.4 9.0   Hepatic Function Latest Ref Rng & Units 09/14/2015 02/08/2015 03/30/2013  Total Protein 6.1 - 8.1 g/dL 7.7 6.5 7.0  Albumin 3.6 - 5.1 g/dL 4.8 3.9 4.3  AST 10 - 35 U/L '29 16 23  '$ ALT 9 - 46 U/L 30 17 33  Alk Phosphatase 40 - 115 U/L 76 65 89  Total Bilirubin 0.2 - 1.2 mg/dL 0.5 0.5 0.5   CBC Latest Ref Rng & Units 09/14/2015 02/08/2015 12/04/2013  WBC 3.8 - 10.8 K/uL 6.9 8.5 12.4(H)  Hemoglobin 13.2 - 17.1 g/dL 14.5 13.1 13.0  Hematocrit 38.5 - 50.0 % 42.6 39.1 38.7(L)  Platelets 140 - 400 K/uL 231 207 215   Lab Results  Component Value Date   MCV 90.1 09/14/2015   MCV 90.5 02/08/2015   MCV 88.6 12/04/2013   Lab Results  Component Value Date   TSH 7.95 (H) 12/24/2015  No results found for: HGBA1C   Lipid Panel     Component Value Date/Time   CHOL 148 09/14/2015 0854   TRIG 159 (H) 09/14/2015 0854   HDL 49  09/14/2015 0854   CHOLHDL 3.0 09/14/2015 0854   VLDL 32 (H) 09/14/2015 0854   LDLCALC 67 09/14/2015 0854    RADIOLOGY: No results found.  IMPRESSION: 1. Coronary artery disease involving native coronary artery of native heart without angina pectoris   2. CAD in native artery   3. Essential hypertension   4. Hyperlipidemia with target LDL less than 70   5. Bruit of right carotid artery   6. Renal insufficiency   7. Medication management   8. Bilateral carotid artery disease ([place])     ASSESSMENT AND PLAN: Frederick Kirby is a 81 year old Caucasian male who is 24 years status post CABG revascularization surgery with LIMA to his LAD, sequential graft to the OM1 and OM2, and sequential graft to the PDA and PLA vessel. He is 13 years since undergoing intervention to the vein graft supplying the intermediate vessel.  At that time he had an occluded graft to the RCA, as well as an occluded graft to the marginal vessel.  Presently, he seems to be doing well on his current medical regimen. He continues to be on anti- platelet therapy and is tolerating this well.  His blood pressure today is stable on his current regimen consisting of amlodipine 10 mg, lisinopril 2.5 mg, Toprol-XL 75 mg.  He continues be on Plavix antiplatelet benefit but does not take aspirin was history of ulcer disease.  He continues to be on omeprazole.  His lipids are aggressively being treated with LDL 67 on Crestor 40 mg daily.  He has chronic kidney disease.  His recent blood work from December 2017 was reviewed and his creatinine had improved to 1.66 from one year ago when it was 1.87.  I reviewed his most recent carotid studies, which actually show improvement which may be a manifestation of his aggressive lipid management.  He is tolerating Plavix alone but due to his PUD history is not on aspirin.  He continues to be active.  In 6 months.  He'll return for follow-up evaluation prior to that office visit.  Repeat laboratory will  be obtained. Time spent: 25 minutes  [name], MD, [facility]  03/17/2016 8:25 AM

## 2016-04-03 ENCOUNTER — Telehealth: Payer: Self-pay | Admitting: Cardiovascular Disease

## 2016-04-03 MED ORDER — METOPROLOL SUCCINATE ER 25 MG PO TB24: 75.0000 mg | ORAL_TABLET | Freq: Every day | ORAL | 3 refills | Status: DC

## 2016-04-03 NOTE — Telephone Encounter (Signed)
New Message   *STAT* If patient is at the pharmacy, call can be transferred to refill team.   1. Which medications need to be refilled? (please list name of each medication and dose if known) Metoprolol 25mg    2. Which pharmacy/location (including street and city if local pharmacy) is medication to be sent to? [facility] [address]  3. Do they need a 30 day or 90 day supply? 90 day  Per pt wife only has 2 pills left

## 2016-04-03 NOTE — Telephone Encounter (Signed)
Rx sent electronically.  

## 2016-05-02 DIAGNOSIS — H2513 Age-related nuclear cataract, bilateral: Secondary | ICD-10-CM | POA: Diagnosis not present

## 2016-05-02 DIAGNOSIS — H35033 Hypertensive retinopathy, bilateral: Secondary | ICD-10-CM | POA: Diagnosis not present

## 2016-05-02 DIAGNOSIS — H31002 Unspecified chorioretinal scars, left eye: Secondary | ICD-10-CM | POA: Diagnosis not present

## 2016-05-02 DIAGNOSIS — H04123 Dry eye syndrome of bilateral lacrimal glands: Secondary | ICD-10-CM | POA: Diagnosis not present

## 2016-05-11 ENCOUNTER — Other Ambulatory Visit: Payer: Self-pay | Admitting: Cardiovascular Disease

## 2016-05-12 ENCOUNTER — Other Ambulatory Visit: Payer: Self-pay | Admitting: Cardiovascular Disease

## 2016-05-12 DIAGNOSIS — H25013 Cortical age-related cataract, bilateral: Secondary | ICD-10-CM | POA: Diagnosis not present

## 2016-05-12 DIAGNOSIS — H269 Unspecified cataract: Secondary | ICD-10-CM | POA: Diagnosis not present

## 2016-05-12 DIAGNOSIS — H2511 Age-related nuclear cataract, right eye: Secondary | ICD-10-CM | POA: Diagnosis not present

## 2016-05-12 DIAGNOSIS — H25041 Posterior subcapsular polar age-related cataract, right eye: Secondary | ICD-10-CM | POA: Diagnosis not present

## 2016-05-12 DIAGNOSIS — H2513 Age-related nuclear cataract, bilateral: Secondary | ICD-10-CM | POA: Diagnosis not present

## 2016-05-28 DIAGNOSIS — H5703 Miosis: Secondary | ICD-10-CM | POA: Diagnosis not present

## 2016-05-28 DIAGNOSIS — H25811 Combined forms of age-related cataract, right eye: Secondary | ICD-10-CM | POA: Diagnosis not present

## 2016-05-28 DIAGNOSIS — H2511 Age-related nuclear cataract, right eye: Secondary | ICD-10-CM | POA: Diagnosis not present

## 2016-05-28 DIAGNOSIS — H2181 Floppy iris syndrome: Secondary | ICD-10-CM | POA: Diagnosis not present

## 2016-06-06 DIAGNOSIS — H25012 Cortical age-related cataract, left eye: Secondary | ICD-10-CM | POA: Diagnosis not present

## 2016-06-06 DIAGNOSIS — H2512 Age-related nuclear cataract, left eye: Secondary | ICD-10-CM | POA: Diagnosis not present

## 2016-06-25 DIAGNOSIS — H2512 Age-related nuclear cataract, left eye: Secondary | ICD-10-CM | POA: Diagnosis not present

## 2016-06-25 DIAGNOSIS — H25812 Combined forms of age-related cataract, left eye: Secondary | ICD-10-CM | POA: Diagnosis not present

## 2016-06-25 DIAGNOSIS — H2181 Floppy iris syndrome: Secondary | ICD-10-CM | POA: Diagnosis not present

## 2016-08-10 ENCOUNTER — Other Ambulatory Visit: Payer: Self-pay | Admitting: Cardiovascular Disease

## 2016-08-19 ENCOUNTER — Other Ambulatory Visit: Payer: Self-pay | Admitting: *Deleted

## 2016-08-19 ENCOUNTER — Encounter: Payer: Self-pay | Admitting: *Deleted

## 2016-08-19 DIAGNOSIS — I2510 Atherosclerotic heart disease of native coronary artery without angina pectoris: Secondary | ICD-10-CM

## 2016-08-19 DIAGNOSIS — E785 Hyperlipidemia, unspecified: Secondary | ICD-10-CM

## 2016-08-19 DIAGNOSIS — I10 Essential (primary) hypertension: Secondary | ICD-10-CM

## 2016-08-19 DIAGNOSIS — Z79899 Other long term (current) drug therapy: Secondary | ICD-10-CM

## 2016-10-16 DIAGNOSIS — Z23 Encounter for immunization: Secondary | ICD-10-CM | POA: Diagnosis not present

## 2016-11-03 ENCOUNTER — Other Ambulatory Visit: Payer: Self-pay | Admitting: *Deleted

## 2016-11-03 DIAGNOSIS — I6523 Occlusion and stenosis of bilateral carotid arteries: Secondary | ICD-10-CM

## 2016-11-10 ENCOUNTER — Other Ambulatory Visit: Payer: Self-pay | Admitting: Cardiovascular Disease

## 2016-11-21 ENCOUNTER — Other Ambulatory Visit: Payer: Self-pay | Admitting: Cardiovascular Disease

## 2016-11-26 DIAGNOSIS — L570 Actinic keratosis: Secondary | ICD-10-CM | POA: Diagnosis not present

## 2016-11-27 DIAGNOSIS — H40022 Open angle with borderline findings, high risk, left eye: Secondary | ICD-10-CM | POA: Diagnosis not present

## 2016-11-27 DIAGNOSIS — H401112 Primary open-angle glaucoma, right eye, moderate stage: Secondary | ICD-10-CM | POA: Diagnosis not present

## 2016-12-20 ENCOUNTER — Other Ambulatory Visit: Payer: Self-pay | Admitting: Cardiovascular Disease

## 2017-02-09 ENCOUNTER — Other Ambulatory Visit: Payer: Self-pay | Admitting: Cardiovascular Disease

## 2017-02-10 ENCOUNTER — Other Ambulatory Visit: Payer: Self-pay | Admitting: Cardiovascular Disease

## 2017-02-15 ENCOUNTER — Other Ambulatory Visit: Payer: Self-pay | Admitting: Cardiovascular Disease

## 2017-02-16 NOTE — Telephone Encounter (Signed)
Rx request sent to pharmacy.  

## 2017-02-20 ENCOUNTER — Ambulatory Visit (HOSPITAL_COMMUNITY)
Admission: RE | Admit: 2017-02-20 | Discharge: 2017-02-20 | Disposition: A | Discharge status: Home / Self Care | Payer: PPO | Source: Ambulatory Visit | Attending: Cardiology | Admitting: Cardiology

## 2017-02-20 DIAGNOSIS — I6523 Occlusion and stenosis of bilateral carotid arteries: Secondary | ICD-10-CM | POA: Diagnosis not present

## 2017-03-04 ENCOUNTER — Other Ambulatory Visit: Payer: Self-pay | Admitting: *Deleted

## 2017-03-04 DIAGNOSIS — I6523 Occlusion and stenosis of bilateral carotid arteries: Secondary | ICD-10-CM

## 2017-03-19 DIAGNOSIS — H43823 Vitreomacular adhesion, bilateral: Secondary | ICD-10-CM | POA: Diagnosis not present

## 2017-03-19 DIAGNOSIS — H401112 Primary open-angle glaucoma, right eye, moderate stage: Secondary | ICD-10-CM | POA: Diagnosis not present

## 2017-03-19 DIAGNOSIS — H40022 Open angle with borderline findings, high risk, left eye: Secondary | ICD-10-CM | POA: Diagnosis not present

## 2017-03-19 DIAGNOSIS — Z961 Presence of intraocular lens: Secondary | ICD-10-CM | POA: Diagnosis not present

## 2017-03-22 ENCOUNTER — Other Ambulatory Visit: Payer: Self-pay | Admitting: Cardiovascular Disease

## 2017-04-08 ENCOUNTER — Ambulatory Visit: Payer: PPO | Admitting: Cardiovascular Disease

## 2017-04-08 ENCOUNTER — Encounter: Payer: Self-pay | Admitting: Cardiovascular Disease

## 2017-04-08 VITALS — BP 139/71 | HR 73 | Ht 68.5 in | Wt 182.8 lb

## 2017-04-08 DIAGNOSIS — K219 Gastro-esophageal reflux disease without esophagitis: Secondary | ICD-10-CM

## 2017-04-08 DIAGNOSIS — I2510 Atherosclerotic heart disease of native coronary artery without angina pectoris: Secondary | ICD-10-CM

## 2017-04-08 DIAGNOSIS — E785 Hyperlipidemia, unspecified: Secondary | ICD-10-CM | POA: Diagnosis not present

## 2017-04-08 DIAGNOSIS — I10 Essential (primary) hypertension: Secondary | ICD-10-CM | POA: Diagnosis not present

## 2017-04-08 DIAGNOSIS — I6523 Occlusion and stenosis of bilateral carotid arteries: Secondary | ICD-10-CM | POA: Diagnosis not present

## 2017-04-08 DIAGNOSIS — Z79899 Other long term (current) drug therapy: Secondary | ICD-10-CM

## 2017-04-08 DIAGNOSIS — N289 Disorder of kidney and ureter, unspecified: Secondary | ICD-10-CM | POA: Diagnosis not present

## 2017-04-08 NOTE — Patient Instructions (Signed)
Medication Instructions:  Your physician recommends that you continue on your current medications as directed. Please refer to the Current Medication list given to you today.  Labwork: Please return for FASTING labs (CMET, CBC, Lipid, TSH)  Our in office lab hours are Monday-Friday 8:00-4:00, closed for lunch 12:45-1:45 pm.  No appointment needed.  Follow-Up: Your physician wants you to follow-up in: 12 months with Dr. Kelly. You will receive a reminder letter in the mail two months in advance. If you don't receive a letter, please call our office to schedule the follow-up appointment.   Any Other Special Instructions Will Be Listed Below (If Applicable).     If you need a refill on your cardiac medications before your next appointment, please call your pharmacy.   

## 2017-04-08 NOTE — Progress Notes (Signed)
Patient ID: Frederick Kirby, male   DOB: 06-Aug-1935, 82 y.o.   MRN: [id number]     HPI: Frederick Kirby is a 82 y.o. male who presents for a 22 month cardiology evaluation.  Mr. Berg has a history of coronary as well as peripheral vascular disease. In 1994 he underwent CABG revascularization surgery( LIMA to the LAD, sequential vein to the OM1 and OM2, sequential vein to the PDA and PLA vessel). In April 2005, a 2.5x24 mm Taxus stent was inserted into the vein graft supplying the ramus intermediate vessel. At that time, his RCA graft was occluded and he also had an occluded graft to the marginal vessel but had a patent LIMA to the LAD.   Additional problems include mixed hyperlipidemia, documented iliac artery aneurysm, mild renal insufficiency, hypertension, as well as hyperlipidemia. He's also had instances with stage II hypertension requiring medication adjustment. He has a history of renal insufficiency  which did improve with reduction of his lisinopril from 20 mg to 10 mg and further titration his metoprolol extended release 75 mg.    Has a history of hyperlipidemia on Crestor 40 mg daily , and lipid studies in March 2015 revealed cholesterol 131, triglycerides 153, HDL 47, LDL 63.  In March 2015 abdominal aortic ultrasound demonstrated  mild amount of nonhemodynamically significant plaque.  There also is mild aneurysmal dilatation of his right common iliac artery , which measured 2.0 x 2.2 cm, unchanged from two years previously.  He has carotid disease with a carotid duplex study in January 2017 demonstrating a 60-79% right internal carotid stenosis and 40-59% left internal carotid artery stenosis.   He has a history of chronic kidney disease, stage III.  His last serum creatinine was 1.71  He underwent a follow-up carotid study on February 2018 which actually was improved from previously and showed 40-59% right internal carotid stenosis in the left internal carotid stenosis is in the 1-39%  range.  He has remained active.  When I saw him one year ago, he denied but has tolerated any episodes of chest pain, PND or orthopnea. In the past he had been started on irbesartan, but apparently did not tolerate this well since When I last saw him one year ago, he was on over the past year, he has remained lisinopril at 2.5 mg mg daily in addition to amlodipine 10 mg and Toprol-XL 75 mg   Over the past year he has been chest pain-free.  He denies change in activity level.  He denies PND orthopnea.  He underwent a one-year follow-up carotid duplex scan on February 20, 2017 which again remains stable with 40-59% right ICA narrowing and 1 at 39% left carotid narrowing.  There is 50% stenoses bilaterally in the external carotids.  Vertebral arteries were patent with antegrade flow.  He had normal subclavian flow.  He has not had recent lab work.  Claris Gower is his primary physician.  Since I saw him one year ago he continues to be on Crestor 40 mg and fish oil for hyperlipidemia.  He also is on omeprazole for GERD and continues to take Plavix. He does not take ASA do to a history of ulcer disease.  He presents for follow-up evaluation.  Past Medical History:  Diagnosis Date  . COPD (chronic obstructive pulmonary disease) ([place])   . Coronary artery disease   . Hyperlipidemia   . Hypertension   . Mild renal insufficiency   . Myocardial infarction ([place])   . S/P CABG (  coronary artery bypass graft) 1994  . Stroke [facility]     Past Surgical History:  Procedure Laterality Date  . CAROTID DOPPLER  12/2011   Right Bulb/Prox ICA 50-69% diameter reduction; Left Bulb/Prox ICA 0-49% diameter reduction  . CORONARY ANGIOPLASTY WITH STENT PLACEMENT  04/28/2003   L Cfx with 99% prox lesion in AV groove; patent SVG to ramus intermedius w/ 95% distal lesion; SVG to OM totally occluded; SVG to RCA totally occluded; native RCA occluded in prox region w/collaterals from LAD into PDA; Taxus 2.5x43m stent to distal VG to  ramus (Dr. RGerrie Nordmann  . CORONARY ARTERY BYPASS GRAFT  01/1992   LIMA-LAD & diagonal, SVG to diagonal, SVG to OM1 & OM2, VG to PDA & PLA (Dr. WRedmond Pulling  . NM MYOCAR PERF WALL MOTION  11/2011   lexiscan - normal study, EF 80% post-stress  . RENAL DOPPLER  12/2011   Right CIA with aneursymal dilatation 2.3x3.1  . TRANSTHORACIC ECHOCARDIOGRAM  10/2008   EF 50-55%, borderline conc LVH, mild posterior wall hypokinesis; mild mitral annular calcification, prolapse of anterior leaflets, mild MR; RVSP 30-469mg; mild TR; mild calcification of AV leaflets    Allergies  Allergen Reactions  . Niaspan [Niacin Er]     Turned red & his skin burned    Current Outpatient Medications  Medication Sig Dispense Refill  . amLODipine (NORVASC) 10 MG tablet TAKE 1 TABLET (10 MG TOTAL) BY MOUTH DAILY. 30 tablet 11  . clopidogrel (PLAVIX) 75 MG tablet TAKE 1 TABLET (75 MG TOTAL) BY MOUTH DAILY. 30 tablet 0  . clopidogrel (PLAVIX) 75 MG tablet Take 1 tablet (75 mg total) by mouth daily. Please schedule appointment for refills. 90 tablet 0  . FOLBIC 2.5-25-2 MG TABS tablet TAKE 1 TABLET BY MOUTH DAILY. 30 tablet 11  . lisinopril (PRINIVIL,ZESTRIL) 2.5 MG tablet TAKE 1 TABLET BY MOUTH EVERY DAY 90 tablet 2  . metoprolol succinate (TOPROL-XL) 25 MG 24 hr tablet TAKE 3 TABLETS (75 MG TOTAL) BY MOUTH DAILY. 270 tablet 0  . Omega-3 Fatty Acids (FISH OIL) 1200 MG CAPS Take 1 capsule by mouth 2 (two) times daily.    . Marland Kitchenmeprazole (PRILOSEC) 40 MG capsule TAKE 1 CAPSULE (40 MG TOTAL) BY MOUTH DAILY. 30 capsule 11  . rosuvastatin (CRESTOR) 40 MG tablet TAKE 1 TABLET (40 MG TOTAL) BY MOUTH DAILY. 90 tablet 0   No current facility-administered medications for this visit.     Social History   Socioeconomic History  . Marital status: Single    Spouse name: Not on file  . Number of children: Not on file  . Years of education: Not on file  . Highest education level: Not on file  Occupational History  . Not on file    Social Needs  . Financial resource strain: Not on file  . Food insecurity:    Worry: Not on file    Inability: Not on file  . Transportation needs:    Medical: Not on file    Non-medical: Not on file  Tobacco Use  . Smoking status: Former Smoker    Last attempt to quit: 01/13/2005    Years since quitting: 12.2  . Smokeless tobacco: Never Used  Substance and Sexual Activity  . Alcohol use: No  . Drug use: No  . Sexual activity: Not Currently  Lifestyle  . Physical activity:    Days per week: Not on file    Minutes per session: Not on file  . Stress: Not on  file  Relationships  . Social connections:    Talks on phone: Not on file    Gets together: Not on file    Attends religious service: Not on file    Active member of club or organization: Not on file    Attends meetings of clubs or organizations: Not on file    Relationship status: Not on file  . Intimate partner violence:    Fear of current or ex partner: Not on file    Emotionally abused: Not on file    Physically abused: Not on file    Forced sexual activity: Not on file  Other Topics Concern  . Not on file  Social History Narrative  . Not on file   Social history is notable that he is married. He is over 18 grandchildren and at least 7 great grandchildren. He quit smoking 8 years ago. He remains active the  Family History  Problem Relation Age of Onset  . Cancer Mother     ROS General: Negative; No fevers, chills, or night sweats;  HEENT: [place] for sinus drainage; No changes in vision or hearing, sinus congestion, difficulty swallowing Pulmonary: Positive for questionable wheezing.; No cough, shortness of breath, hemoptysis Cardiovascular: see HPI; No chest pain, presyncope, syncope, palpatations; occasional left ankle swelling GI: Negative; No nausea, vomiting, diarrhea, or abdominal pain GU: Negative; No dysuria, hematuria, or difficulty voiding Musculoskeletal: Negative; no myalgias, joint pain, or  weakness Hematologic/Oncology: Negative; no easy bruising, bleeding Endocrine: Negative; no heat/cold intolerance; no diabetes Neuro: Negative; no changes in balance, headaches Skin: Negative; No rashes or skin lesions Psychiatric: Negative; No behavioral problems, depression Sleep: Negative; No snoring, daytime sleepiness, hypersomnolence, bruxism, restless legs, hypnogognic hallucinations, no cataplexy Other comprehensive 14 point system review is negative.   Physical Exam BP 139/71   Pulse 73   Ht 5' 8.5" (1.74 m)   Wt 182 lb 12.8 oz (82.9 kg)   BMI 27.39 kg/m    Repeat blood pressure by me was 132/72 supine and 130/70 standing  Wt Readings from Last 3 Encounters:  04/08/17 182 lb 12.8 oz (82.9 kg)  03/17/16 183 lb (83 kg)  09/14/15 183 lb 4 oz (83.1 kg)   General: Alert, oriented, no distress.  Skin: normal turgor, no rashes, warm and dry HEENT: Normocephalic, atraumatic. Pupils equal round and reactive to light; sclera anicteric; extraocular muscles intact;  Nose without nasal septal hypertrophy Mouth/Parynx benign; Mallinpatti scale 3 Neck: No JVD, no carotid bruits; normal carotid upstroke Lungs: clear to ausculatation and percussion; no wheezing or rales Chest wall: without tenderness to palpitation Heart: PMI not displaced, RRR, s1 s2 normal, 1/6 systolic murmur, no diastolic murmur, no rubs, gallops, thrills, or heaves Abdomen: soft, nontender; no hepatosplenomehaly, BS+; abdominal aorta nontender and not dilated by palpation. Back: no CVA tenderness Pulses 2+ Musculoskeletal: full range of motion, normal strength, no joint deformities Extremities: no clubbing cyanosis or edema, Homan's sign negative  Neurologic: grossly nonfocal; Cranial nerves grossly wnl Psychologic: Normal mood and affect   ECG (independently read by me): Normal sinus rhythm at 68 bpm.  First-degree AV block with a PR interval of '2 3 2 '$ ms.  No ectopy.  No ST segment changes.  March 2018  ECG  (independently read by me): NSR  At 78 with 1st degree AV block  September 2017 ECG (independently read by me): Sinus rhythm with first degree AV block with a PR interval of 270 ms.  QTc interval is normal.  There are no  significant ST-T changes.  November 2016 ECG (independently read by me): Sinus bradycardia 58 bpm.,  First-degree AV block with increased PR interval at 272 ms.  ECG (independently read by me): normal sinus rhythm at 60 bpm.  First-degree AV block.  No significant ST-T changes.  QTc interval normal.  May 2015 ECG (independently read by me): Normal sinus rhythm at 62 beats per minute.  First degree AV block.  Prior ECG: Normal sinus rhythm at 69 beats per minute. First degree AV block with PR interval 236 ms. QTc interval normal at 420 ms.  LABS: BMP Latest Ref Rng & Units 12/24/2015 09/14/2015 03/08/2015  Glucose 65 - 99 mg/dL 105(H) 106(H) 103(H)  BUN 7 - 25 mg/dL '15 12 14  '$ Creatinine 0.70 - 1.18 mg/dL 1.66(H) 1.77(H) 1.71(H)  Sodium 135 - 146 mmol/L 137 134(L) 137  Potassium 3.5 - 5.3 mmol/L 4.5 4.6 4.6  Chloride 98 - 110 mmol/L 102 97(L) 102  CO2 20 - 31 mmol/L '27 27 28  '$ Calcium 8.6 - 10.3 mg/dL 9.3 9.4 9.0   Hepatic Function Latest Ref Rng & Units 09/14/2015 02/08/2015 03/30/2013  Total Protein 6.1 - 8.1 g/dL 7.7 6.5 7.0  Albumin 3.6 - 5.1 g/dL 4.8 3.9 4.3  AST 10 - 35 U/L '29 16 23  '$ ALT 9 - 46 U/L 30 17 33  Alk Phosphatase 40 - 115 U/L 76 65 89  Total Bilirubin 0.2 - 1.2 mg/dL 0.5 0.5 0.5   CBC Latest Ref Rng & Units 09/14/2015 02/08/2015 12/04/2013  WBC 3.8 - 10.8 K/uL 6.9 8.5 12.4(H)  Hemoglobin 13.2 - 17.1 g/dL 14.5 13.1 13.0  Hematocrit 38.5 - 50.0 % 42.6 39.1 38.7(L)  Platelets 140 - 400 K/uL 231 207 215   Lab Results  Component Value Date   MCV 90.1 09/14/2015   MCV 90.5 02/08/2015   MCV 88.6 12/04/2013   Lab Results  Component Value Date   TSH 7.95 (H) 12/24/2015  No results found for: HGBA1C   Lipid Panel     Component Value Date/Time   CHOL 148  09/14/2015 0854   TRIG 159 (H) 09/14/2015 0854   HDL 49 09/14/2015 0854   CHOLHDL 3.0 09/14/2015 0854   VLDL 32 (H) 09/14/2015 0854   LDLCALC 67 09/14/2015 0854    RADIOLOGY: No results found.  IMPRESSION: 1. CAD in native artery   2. Essential hypertension   3. Hyperlipidemia with target LDL less than 70   4. Medication management   5. Renal insufficiency   6. Bilateral carotid artery stenosis   7. Gastroesophageal reflux disease without esophagitis     ASSESSMENT AND PLAN: Mr. Abbie Jablon is a 82 year old Caucasian male who is 25  years status post CABG revascularization surgery with LIMA to his LAD, sequential graft to the OM1 and OM2, and sequential graft to the PDA and PLA vessel. He is 14 years since undergoing intervention to the vein graft supplying the intermediate vessel.  At that time he had an occluded graft to the RCA, as well as an occluded graft to the marginal vessel.  Over the past year, he denies recurrent anginal symptomatology on his medical regimen consisting of amlodipine 10 mg, Toprol-XL 75 mg in addition to lisinopril 2.5 mg daily.  His blood pressure is well controlled on this regimen.  He continues to be on Plavix for antiplatelet therapy but is no longer taking aspirin with a prior ulcer.  He continues to be on rosuvastatin 40 mg.  He denies myalgias.  He is maintained on omeprazole 40 mg but it may be worthwhile to switch this to pantoprazole with his clopidogrel treatment.  He previously had renal insufficiency.  With his last creatinine at 1.66 in December 2017.  He has not had recent lab work.  Unfortunately he is not fasting this morning.  A complete set of fasting blood work will be obtained and adjustments to medications will be made if necessary.  As long as he remains stable I will see him in 1 year for reevaluation  Time spent: 25 minutes  [name], MD, [facility]  04/08/2017 9:14 AM

## 2017-04-10 ENCOUNTER — Telehealth: Payer: Self-pay | Admitting: *Deleted

## 2017-04-10 MED ORDER — PANTOPRAZOLE SODIUM 40 MG PO TBEC: 40.0000 mg | DELAYED_RELEASE_TABLET | Freq: Every day | ORAL | 3 refills | Status: DC

## 2017-04-10 NOTE — Telephone Encounter (Signed)
Per Dr. Claiborne Billings, stop omeprazole and start Protonix d/t currently taking Plavix.   Patient and wife aware rx sent to pharmacy.

## 2017-04-16 ENCOUNTER — Other Ambulatory Visit: Payer: Self-pay | Admitting: Cardiovascular Disease

## 2017-05-02 ENCOUNTER — Other Ambulatory Visit: Payer: Self-pay | Admitting: Cardiovascular Disease

## 2017-05-04 DIAGNOSIS — R350 Frequency of micturition: Secondary | ICD-10-CM | POA: Diagnosis not present

## 2017-05-04 DIAGNOSIS — N390 Urinary tract infection, site not specified: Secondary | ICD-10-CM | POA: Diagnosis not present

## 2017-05-16 ENCOUNTER — Other Ambulatory Visit: Payer: Self-pay | Admitting: Cardiovascular Disease

## 2017-05-18 NOTE — Telephone Encounter (Signed)
REFILL 

## 2017-06-01 DIAGNOSIS — R300 Dysuria: Secondary | ICD-10-CM | POA: Diagnosis not present

## 2017-06-01 DIAGNOSIS — R3915 Urgency of urination: Secondary | ICD-10-CM | POA: Diagnosis not present

## 2017-06-09 ENCOUNTER — Other Ambulatory Visit: Payer: Self-pay | Admitting: Cardiovascular Disease

## 2017-06-17 ENCOUNTER — Other Ambulatory Visit: Payer: Self-pay | Admitting: Cardiovascular Disease

## 2017-07-13 DIAGNOSIS — I10 Essential (primary) hypertension: Secondary | ICD-10-CM | POA: Diagnosis not present

## 2017-07-13 DIAGNOSIS — N390 Urinary tract infection, site not specified: Secondary | ICD-10-CM | POA: Diagnosis not present

## 2017-07-13 DIAGNOSIS — R319 Hematuria, unspecified: Secondary | ICD-10-CM | POA: Diagnosis not present

## 2017-07-13 DIAGNOSIS — Z23 Encounter for immunization: Secondary | ICD-10-CM | POA: Diagnosis not present

## 2017-08-05 ENCOUNTER — Ambulatory Visit: Payer: PPO | Admitting: Family Medicine

## 2017-09-10 ENCOUNTER — Other Ambulatory Visit: Payer: Self-pay | Admitting: Cardiovascular Disease

## 2017-09-10 NOTE — Telephone Encounter (Signed)
Rx sent to pharmacy   

## 2018-01-18 ENCOUNTER — Other Ambulatory Visit: Payer: Self-pay | Admitting: Cardiovascular Disease

## 2018-01-18 NOTE — Telephone Encounter (Signed)
Rx has been sent to the pharmacy electronically. ° °

## 2018-02-24 ENCOUNTER — Ambulatory Visit (HOSPITAL_COMMUNITY)
Admission: RE | Admit: 2018-02-24 | Discharge: 2018-02-24 | Disposition: A | Discharge status: Home / Self Care | Payer: PPO | Source: Ambulatory Visit | Attending: Cardiovascular Disease | Admitting: Cardiovascular Disease

## 2018-02-24 DIAGNOSIS — I6523 Occlusion and stenosis of bilateral carotid arteries: Secondary | ICD-10-CM

## 2018-03-08 ENCOUNTER — Other Ambulatory Visit: Payer: Self-pay | Admitting: Cardiovascular Disease

## 2018-03-09 ENCOUNTER — Telehealth: Payer: Self-pay | Admitting: Cardiovascular Disease

## 2018-03-09 DIAGNOSIS — I723 Aneurysm of iliac artery: Secondary | ICD-10-CM

## 2018-03-09 NOTE — Telephone Encounter (Signed)
-----   Message from [name], MD sent at 03/07/2018  4:46 PM EST ----- Mild bilateral carotid plaque, 40 to 59% right ICA, only 39% left.  Bilateral ECA greater than 50% stenosis

## 2018-03-09 NOTE — Telephone Encounter (Signed)
LMTCB for results. 

## 2018-03-09 NOTE — Telephone Encounter (Signed)
Result Notes for VAS US CAROTID   Notes recorded by Therisa Doyne on 03/09/2018 at 9:17 AM EST Results given to pt wife--verbal ok from pt on the phone. Pt wife verbalized understanding. She asked if his aneurysm needs to be checked. Looks like last AAA doppler done in 2015. Pt wife states she is worried about it. Told pt wife I will send message to Dr. Claiborne Billings to see if he thinks this should be checked. Pt wife verbalized thanks. ------  Notes recorded by Therisa Doyne on 03/09/2018 at 8:46 AM EST LMTCB for results ------  Notes recorded by [name], MD on 03/07/2018 at 4:46 PM EST Mild bilateral carotid plaque, 40 to 59% right ICA, only 39% left. Bilateral ECA greater than 50% stenosis

## 2018-03-10 NOTE — Telephone Encounter (Signed)
Called patient, LVM advising that the AAA was ordered. They should be in contact when the patient to schedule.  Left call back number if questions.

## 2018-03-10 NOTE — Telephone Encounter (Signed)
Okay to schedule for 5-year follow-up evaluation

## 2018-03-17 ENCOUNTER — Ambulatory Visit (HOSPITAL_COMMUNITY)
Admission: RE | Admit: 2018-03-17 | Discharge: 2018-03-17 | Disposition: A | Discharge status: Home / Self Care | Payer: PPO | Source: Ambulatory Visit | Attending: Cardiovascular Disease | Admitting: Cardiovascular Disease

## 2018-03-17 DIAGNOSIS — I723 Aneurysm of iliac artery: Secondary | ICD-10-CM | POA: Insufficient documentation

## 2018-03-30 ENCOUNTER — Other Ambulatory Visit: Payer: Self-pay | Admitting: Cardiovascular Disease

## 2018-04-09 ENCOUNTER — Telehealth: Payer: Self-pay

## 2018-04-09 NOTE — Telephone Encounter (Signed)
   Cardiac Questionnaire:    Since your last visit or hospitalization:    1. Have you been having new or worsening chest pain? no   2. Have you been having new or worsening shortness of breath? no 3. Have you been having new or worsening leg swelling, wt gain, or increase in abdominal girth (pants fitting more tightly)? no   4. Have you had any passing out spells? no              Primary Cardiologist:  [name], MD   Patient contacted.  History reviewed.  No symptoms to suggest any unstable cardiac conditions.  Based on discussion, with current pandemic situation, we will be postponing this appointment for [name] with a plan for f/u in 12 wks or sooner if feasible/necessary.  If symptoms change, he has been instructed to contact our office.   Routing to C19 CANCEL pool for tracking (P CV DIV CV19 CANCEL - reason for visit "other.") and assigning priority (1 = 4-6 wks, 2 = 6-12 wks, 3 = >12 wks).   Ena Dawley, LPN  7/86/7544 92:01 AM         .

## 2018-04-13 ENCOUNTER — Ambulatory Visit: Payer: PPO | Admitting: Cardiovascular Disease

## 2018-04-15 ENCOUNTER — Other Ambulatory Visit: Payer: Self-pay | Admitting: Cardiovascular Disease

## 2018-04-27 ENCOUNTER — Telehealth: Payer: Self-pay | Admitting: Cardiovascular Disease

## 2018-04-27 NOTE — Telephone Encounter (Signed)
Called patient to reschedule 03-20 appointment, but, the patient does not wish to leave the house until the virus is over.  He will call and make an appointment then.

## 2018-05-21 ENCOUNTER — Other Ambulatory Visit: Payer: Self-pay | Admitting: Cardiovascular Disease

## 2018-05-29 ENCOUNTER — Other Ambulatory Visit: Payer: Self-pay | Admitting: Cardiovascular Disease

## 2018-05-30 ENCOUNTER — Other Ambulatory Visit: Payer: Self-pay | Admitting: Cardiovascular Disease

## 2018-06-03 ENCOUNTER — Other Ambulatory Visit: Payer: Self-pay | Admitting: Cardiovascular Disease

## 2018-06-23 ENCOUNTER — Other Ambulatory Visit: Payer: Self-pay | Admitting: Cardiovascular Disease

## 2018-07-01 ENCOUNTER — Other Ambulatory Visit (HOSPITAL_COMMUNITY): Payer: Self-pay | Admitting: Cardiovascular Disease

## 2018-07-01 DIAGNOSIS — I6523 Occlusion and stenosis of bilateral carotid arteries: Secondary | ICD-10-CM

## 2018-07-09 ENCOUNTER — Other Ambulatory Visit: Payer: Self-pay | Admitting: Cardiovascular Disease

## 2018-08-13 ENCOUNTER — Other Ambulatory Visit: Payer: Self-pay | Admitting: Cardiovascular Disease

## 2018-08-25 ENCOUNTER — Other Ambulatory Visit: Payer: Self-pay | Admitting: Cardiovascular Disease

## 2018-09-14 ENCOUNTER — Other Ambulatory Visit: Payer: Self-pay | Admitting: Cardiovascular Disease

## 2018-09-19 ENCOUNTER — Other Ambulatory Visit: Payer: Self-pay | Admitting: Cardiovascular Disease

## 2018-09-29 ENCOUNTER — Other Ambulatory Visit: Payer: Self-pay | Admitting: Cardiovascular Disease

## 2018-10-14 ENCOUNTER — Other Ambulatory Visit: Payer: Self-pay | Admitting: Cardiovascular Disease

## 2018-10-19 ENCOUNTER — Other Ambulatory Visit: Payer: Self-pay | Admitting: Cardiovascular Disease

## 2018-10-20 ENCOUNTER — Encounter: Payer: Self-pay | Admitting: Physician Assistant

## 2018-10-20 ENCOUNTER — Telehealth (INDEPENDENT_AMBULATORY_CARE_PROVIDER_SITE_OTHER): Payer: PPO | Admitting: Physician Assistant

## 2018-10-20 ENCOUNTER — Telehealth: Payer: Self-pay

## 2018-10-20 VITALS — Ht 68.5 in | Wt 184.6 lb

## 2018-10-20 DIAGNOSIS — Z8673 Personal history of transient ischemic attack (TIA), and cerebral infarction without residual deficits: Secondary | ICD-10-CM

## 2018-10-20 DIAGNOSIS — E785 Hyperlipidemia, unspecified: Secondary | ICD-10-CM

## 2018-10-20 DIAGNOSIS — N183 Chronic kidney disease, stage 3 unspecified: Secondary | ICD-10-CM

## 2018-10-20 DIAGNOSIS — I25700 Atherosclerosis of coronary artery bypass graft(s), unspecified, with unstable angina pectoris: Secondary | ICD-10-CM

## 2018-10-20 DIAGNOSIS — I10 Essential (primary) hypertension: Secondary | ICD-10-CM

## 2018-10-20 DIAGNOSIS — I723 Aneurysm of iliac artery: Secondary | ICD-10-CM

## 2018-10-20 DIAGNOSIS — I6523 Occlusion and stenosis of bilateral carotid arteries: Secondary | ICD-10-CM

## 2018-10-20 MED ORDER — FOLBIC 2.5-25-2 MG PO TABS: 1.0000 | ORAL_TABLET | Freq: Every day | ORAL | 3 refills | Status: DC

## 2018-10-20 MED ORDER — AMLODIPINE BESYLATE 10 MG PO TABS: 10.0000 mg | ORAL_TABLET | Freq: Every day | ORAL | 3 refills | Status: DC

## 2018-10-20 MED ORDER — CLOPIDOGREL BISULFATE 75 MG PO TABS: 75.0000 mg | ORAL_TABLET | Freq: Every day | ORAL | 3 refills | Status: DC

## 2018-10-20 MED ORDER — LISINOPRIL 2.5 MG PO TABS: 2.5000 mg | ORAL_TABLET | Freq: Every day | ORAL | 3 refills | Status: DC

## 2018-10-20 MED ORDER — METOPROLOL SUCCINATE ER 25 MG PO TB24: 25.0000 mg | ORAL_TABLET | Freq: Every day | ORAL | 3 refills | Status: DC

## 2018-10-20 MED ORDER — ROSUVASTATIN CALCIUM 40 MG PO TABS: 40.0000 mg | ORAL_TABLET | Freq: Every day | ORAL | 3 refills | Status: DC

## 2018-10-20 MED ORDER — PANTOPRAZOLE SODIUM 40 MG PO TBEC: 40.0000 mg | DELAYED_RELEASE_TABLET | Freq: Every day | ORAL | 3 refills | Status: DC

## 2018-10-20 MED ORDER — FISH OIL 1200 MG PO CAPS: 1.0000 | ORAL_CAPSULE | Freq: Two times a day (BID) | ORAL | 3 refills | Status: DC

## 2018-10-20 NOTE — Progress Notes (Signed)
Virtual Visit via Telephone Note   This visit type was conducted due to national recommendations for restrictions regarding the COVID-19 Pandemic (e.g. social distancing) in an effort to limit this patient's exposure and mitigate transmission in our community.  Due to his co-morbid illnesses, this patient is at least at moderate risk for complications without adequate follow up.  This format is felt to be most appropriate for this patient at this time.  The patient did not have access to video technology/had technical difficulties with video requiring transitioning to audio format only (telephone).  All issues noted in this document were discussed and addressed.  No physical exam could be performed with this format.  Please refer to the patient's chart for his  consent to telehealth for [facility].   Date:  10/21/2018   ID:  Frederick Kirby, DOB 1935/02/03, MRN [id number]  Patient Location: Home Provider Location: Home  PCP:  [name], MD  Cardiologist:  [name], MD Electrophysiologist:  None   Evaluation Performed:  Follow-Up Visit  Chief Complaint:  Annual followup  History of Present Illness:    Frederick Kirby is a 83 y.o. male with past medical history of carotid artery disease, iliac artery aneurysm, CKD stage III, CAD s/p CABG in 1994 (LIMA to LAD, sequential SVG to OM1 and OM 2, sequential SVG to PDA and PLA), hypertension, hyperlipidemia and history of CVA.  Patient underwent stent to ramus in April 2005.  RCA was occluded at the time and he also had occlusion to SVG to marginal branch but patent LIMA to LAD.  Patient was last seen by Dr. Claiborne Billings on 04/08/2017, at which time, he was on Plavix monotherapy.  He was not taking aspirin due to prior ulcer.  Last carotid ultrasound obtained on 02/24/2018 showed 40 to 59% right ICA stenosis, 1 to 39% left ICA stenosis, greater than 50% bilateral ECA stenosis.  AAA Doppler obtained on 03/17/2018 demonstrated stable right common  iliac artery aneurysm measuring 2.1 cm.  Patient was contacted today via telephone for virtual visit.  He denies any recent chest pain, shortness of breath, or dizziness.  He has no bleeding issue on the Plavix.  Previous lab work was dating back to 2017, however patient says he was just seen by his PCP earlier this year and had a new set of lab work done.  We will request the lab results.  Otherwise he is due for repeat his AAA Doppler and carotid Doppler around March of next year, this can be delayed until April or May if needed given the situation with pandemic.  He preferred not to travel to the office with the current pandemic.  The patient does not have symptoms concerning for COVID-19 infection (fever, chills, cough, or new shortness of breath).    Past Medical History:  Diagnosis Date  . COPD (chronic obstructive pulmonary disease) ([place])   . Coronary artery disease   . Hyperlipidemia   . Hypertension   . Mild renal insufficiency   . Myocardial infarction ([place])   . S/P CABG (coronary artery bypass graft) 1994  . Stroke [facility])    Past Surgical History:  Procedure Laterality Date  . CAROTID DOPPLER  12/2011   Right Bulb/Prox ICA 50-69% diameter reduction; Left Bulb/Prox ICA 0-49% diameter reduction  . CORONARY ANGIOPLASTY WITH STENT PLACEMENT  04/28/2003   L Cfx with 99% prox lesion in AV groove; patent SVG to ramus intermedius w/ 95% distal lesion; SVG to OM totally occluded; SVG  to RCA totally occluded; native RCA occluded in prox region w/collaterals from LAD into PDA; Taxus 2.5x67mm stent to distal VG to ramus (Dr. Gerrie Nordmann)  . CORONARY ARTERY BYPASS GRAFT  01/1992   LIMA-LAD & diagonal, SVG to diagonal, SVG to OM1 & OM2, VG to PDA & PLA (Dr. Redmond Pulling)  . NM MYOCAR PERF WALL MOTION  11/2011   lexiscan - normal study, EF 80% post-stress  . RENAL DOPPLER  12/2011   Right CIA with aneursymal dilatation 2.3x3.1  . TRANSTHORACIC ECHOCARDIOGRAM  10/2008   EF 50-55%, borderline conc  LVH, mild posterior wall hypokinesis; mild mitral annular calcification, prolapse of anterior leaflets, mild MR; RVSP 30-32mmHg; mild TR; mild calcification of AV leaflets     Current Meds  Medication Sig  . amLODipine (NORVASC) 10 MG tablet Take 1 tablet (10 mg total) by mouth daily.  . clopidogrel (PLAVIX) 75 MG tablet Take 1 tablet (75 mg total) by mouth daily.  . folic acid-pyridoxine-cyancobalamin (FOLBIC) 2.5-25-2 MG TABS tablet Take 1 tablet by mouth daily.  Marland Kitchen lisinopril (ZESTRIL) 2.5 MG tablet Take 1 tablet (2.5 mg total) by mouth daily.  . metoprolol succinate (TOPROL-XL) 25 MG 24 hr tablet Take 1 tablet (25 mg total) by mouth daily.  . Omega-3 Fatty Acids (FISH OIL) 1200 MG CAPS Take 1 capsule (1,200 mg total) by mouth 2 (two) times daily.  . pantoprazole (PROTONIX) 40 MG tablet Take 1 tablet (40 mg total) by mouth daily.  . rosuvastatin (CRESTOR) 40 MG tablet Take 1 tablet (40 mg total) by mouth daily.  . [DISCONTINUED] amLODipine (NORVASC) 10 MG tablet TAKE 1 TABLET BY MOUTH EVERY DAY  . [DISCONTINUED] clopidogrel (PLAVIX) 75 MG tablet TAKE 1 TABLET BY MOUTH DAILY. PLEASE SCHEDULE APPOINTMENT FOR REFILLS.  . [DISCONTINUED] FOLBIC 2.5-25-2 MG TABS tablet TAKE 1 TABLET BY MOUTH DAILY.  . [DISCONTINUED] lisinopril (ZESTRIL) 2.5 MG tablet TAKE 1 TABLET BY MOUTH EVERY DAY  . [DISCONTINUED] metoprolol succinate (TOPROL-XL) 25 MG 24 hr tablet Take 1 tablet (25 mg total) by mouth daily. NEEDS OV BEFORE ADDITIONAL REFILLS  . [DISCONTINUED] Omega-3 Fatty Acids (FISH OIL) 1200 MG CAPS Take 1 capsule by mouth 2 (two) times daily.  . [DISCONTINUED] pantoprazole (PROTONIX) 40 MG tablet TAKE 1 TABLET (40 MG TOTAL) BY MOUTH DAILY. *NEEDS OFFICE VISIT*  . [DISCONTINUED] rosuvastatin (CRESTOR) 40 MG tablet TAKE 1 TABLET BY MOUTH EVERY DAY     Allergies:   Niaspan [niacin er]   Social History   Tobacco Use  . Smoking status: Former Smoker    Quit date: 01/13/2005    Years since quitting: 13.7   . Smokeless tobacco: Never Used  Substance Use Topics  . Alcohol use: No  . Drug use: No     Family Hx: The patient's family history includes Cancer in his mother.  ROS:   Please see the history of present illness.     All other systems reviewed and are negative.   Prior CV studies:   The following studies were reviewed today:  Carotid doppler 02/24/2018 Summary: Right Carotid: Velocities in the right ICA are consistent with a 40-59%                stenosis. The ECA appears >50% stenosed.  Left Carotid: Velocities in the left ICA are consistent with a 1-39% stenosis.               The ECA appears >50% stenosed.  Vertebrals:  Bilateral vertebral arteries demonstrate antegrade flow. Subclavians:  Left subclavian artery flow was disturbed. Normal flow hemodynamics were seen in the right subclavian artery    AAA duplex 03/17/2018 Summary: Abdominal Aorta: There is evidence of abnormal dilation of the Right Common Iliac artery. The largest aortic measurement is 2.1 cm. The right common iliac artery aneurysm appears stable c/t previous exam.   Labs/Other Tests and Data Reviewed:    EKG:  An ECG dated 04/08/2017 was personally reviewed today and demonstrated:  Normal sinus rhythm with first-degree AV block.  Recent Labs: No results found for requested labs within last 8760 hours.   Recent Lipid Panel Lab Results  Component Value Date/Time   CHOL 148 09/14/2015 08:54 AM   TRIG 159 (H) 09/14/2015 08:54 AM   HDL 49 09/14/2015 08:54 AM   CHOLHDL 3.0 09/14/2015 08:54 AM   LDLCALC 67 09/14/2015 08:54 AM    Wt Readings from Last 3 Encounters:  10/20/18 184 lb 9.6 oz (83.7 kg)  04/08/17 182 lb 12.8 oz (82.9 kg)  03/17/16 183 lb (83 kg)     Objective:    Vital Signs:  Ht 5' 8.5" (1.74 m)   Wt 184 lb 9.6 oz (83.7 kg)   BMI 27.66 kg/m    VITAL SIGNS:  reviewed  ASSESSMENT & PLAN:    1. CAD s/p CABG: He is on Plavix monotherapy.  Denies any recent chest pain.  2.  Carotid artery disease: Stable on last carotid Doppler earlier this year.  Due for repeat carotid Doppler around March 2021  3. Iliac aneurysm: Stable on previous Doppler in March 2020, due for repeat imaging in 1 year  4. CKD stage III: Followed by his PCP  5. Hypertension: Continue current blood pressure medication.  6. Hyperlipidemia: Continue Crestor, request lab work from PCP.  COVID-19 Education: The signs and symptoms of COVID-19 were discussed with the patient and how to seek care for testing (follow up with PCP or arrange E-visit).  The importance of social distancing was discussed today.  Time:   Today, I have spent 5 minutes with the patient with telehealth technology discussing the above problems.     Medication Adjustments/Labs and Tests Ordered: Current medicines are reviewed at length with the patient today.  Concerns regarding medicines are outlined above.   Tests Ordered: Orders Placed This Encounter  Procedures  . VAS [place] AAA DUPLEX    Medication Changes: Meds ordered this encounter  Medications  . amLODipine (NORVASC) 10 MG tablet    Sig: Take 1 tablet (10 mg total) by mouth daily.    Dispense:  90 tablet    Refill:  3  . clopidogrel (PLAVIX) 75 MG tablet    Sig: Take 1 tablet (75 mg total) by mouth daily.    Dispense:  90 tablet    Refill:  3  . lisinopril (ZESTRIL) 2.5 MG tablet    Sig: Take 1 tablet (2.5 mg total) by mouth daily.    Dispense:  90 tablet    Refill:  3  . metoprolol succinate (TOPROL-XL) 25 MG 24 hr tablet    Sig: Take 1 tablet (25 mg total) by mouth daily.    Dispense:  90 tablet    Refill:  3  . Omega-3 Fatty Acids (FISH OIL) 1200 MG CAPS    Sig: Take 1 capsule (1,200 mg total) by mouth 2 (two) times daily.    Dispense:  180 capsule    Refill:  3  . pantoprazole (PROTONIX) 40 MG tablet    Sig: Take 1  tablet (40 mg total) by mouth daily.    Dispense:  90 tablet    Refill:  3  . rosuvastatin (CRESTOR) 40 MG tablet    Sig: Take  1 tablet (40 mg total) by mouth daily.    Dispense:  90 tablet    Refill:  3  . folic acid-pyridoxine-cyancobalamin (FOLBIC) 2.5-25-2 MG TABS tablet    Sig: Take 1 tablet by mouth daily.    Dispense:  90 tablet    Refill:  3    Follow Up:  Either In Person or Virtual Visit in 1 year(s)  Signed, Almyra Deforest, [place]  10/21/2018 11:24 PM    [place]

## 2018-10-20 NOTE — Telephone Encounter (Signed)

## 2018-10-20 NOTE — Telephone Encounter (Signed)
Called patient to discuss AVS instructions gave recommendations and patient voiced understanding. AVS summary mailed to patient.

## 2018-10-20 NOTE — Patient Instructions (Addendum)
Medication Instructions:   Your physician recommends that you continue on your current medications as directed. Please refer to the Current Medication list given to you today.  If you need a refill on your cardiac medications before your next appointment, please call your pharmacy.   Lab work:  NONE ordered at this time of appointment   If you have labs (blood work) drawn today and your tests are completely normal, you will receive your results only by: Marland Kitchen MyChart Message (if you have MyChart) OR . A paper copy in the mail If you have any lab test that is abnormal or we need to change your treatment, we will call you to review the results.  Testing/Procedures: Your physician has requested that you have a repeat abdominal aorta duplex. During this test, an ultrasound is used to evaluate the aorta. Allow 30 minutes for this exam. Do not eat after midnight the day before and avoid carbonated beverages  Please schedule for March 2021 or later   Dr. Claiborne Billings requested that you have a carotid duplex back in June 2020. If you are not already scheduled for this someone will be in contact with you to get it scheduled. This test is an ultrasound of the carotid arteries in your neck. It looks at blood flow through these arteries that supply the brain with blood. Allow one hour for this exam. There are no restrictions or special instructions.  Follow-Up: At [facility], you and your health needs are our priority.  As part of our continuing mission to provide you with exceptional heart care, we have created designated Provider Care Teams.  These Care Teams include your primary Cardiologist (physician) and Advanced Practice Providers (APPs -  Physician Assistants and Nurse Practitioners) who all work together to provide you with the care you need, when you need it. You will need a follow up appointment in 12 months-October 2021.  Please call our office in July and/or August 2021 to schedule this  appointment.  You may see [name], MD or one of the following Advanced Practice Providers on your designated Care Team: [place], [place] . Fabian Sharp, PA-C  Any Other Special Instructions Will Be Listed Below (If Applicable).

## 2019-04-06 ENCOUNTER — Other Ambulatory Visit: Payer: Self-pay

## 2019-04-06 ENCOUNTER — Ambulatory Visit (HOSPITAL_COMMUNITY)
Admission: RE | Admit: 2019-04-06 | Discharge: 2019-04-06 | Disposition: A | Discharge status: Home / Self Care | Payer: PPO | Source: Ambulatory Visit | Attending: Cardiology | Admitting: Cardiology

## 2019-04-06 ENCOUNTER — Other Ambulatory Visit (HOSPITAL_COMMUNITY): Payer: Self-pay | Admitting: Cardiovascular Disease

## 2019-04-06 ENCOUNTER — Ambulatory Visit (HOSPITAL_BASED_OUTPATIENT_CLINIC_OR_DEPARTMENT_OTHER)
Admission: RE | Admit: 2019-04-06 | Discharge: 2019-04-06 | Disposition: A | Discharge status: Home / Self Care | Payer: PPO | Source: Ambulatory Visit | Attending: Cardiovascular Disease | Admitting: Cardiovascular Disease

## 2019-04-06 DIAGNOSIS — I6523 Occlusion and stenosis of bilateral carotid arteries: Secondary | ICD-10-CM

## 2019-04-06 DIAGNOSIS — I723 Aneurysm of iliac artery: Secondary | ICD-10-CM

## 2019-04-06 DIAGNOSIS — I6521 Occlusion and stenosis of right carotid artery: Secondary | ICD-10-CM

## 2019-04-18 ENCOUNTER — Other Ambulatory Visit: Payer: Self-pay

## 2019-04-18 DIAGNOSIS — I6523 Occlusion and stenosis of bilateral carotid arteries: Secondary | ICD-10-CM

## 2019-10-18 ENCOUNTER — Other Ambulatory Visit: Payer: Self-pay | Admitting: Physician Assistant

## 2019-10-20 ENCOUNTER — Other Ambulatory Visit: Payer: Self-pay | Admitting: Physician Assistant

## 2020-01-13 ENCOUNTER — Other Ambulatory Visit: Payer: Self-pay | Admitting: Physician Assistant

## 2020-01-14 ENCOUNTER — Other Ambulatory Visit: Payer: Self-pay | Admitting: Physician Assistant

## 2020-01-16 ENCOUNTER — Other Ambulatory Visit: Payer: Self-pay | Admitting: Physician Assistant

## 2020-02-06 ENCOUNTER — Other Ambulatory Visit: Payer: Self-pay | Admitting: Cardiovascular Disease

## 2020-02-08 ENCOUNTER — Other Ambulatory Visit: Payer: Self-pay | Admitting: Cardiovascular Disease

## 2020-02-09 ENCOUNTER — Other Ambulatory Visit: Payer: Self-pay | Admitting: Cardiovascular Disease

## 2020-02-29 ENCOUNTER — Other Ambulatory Visit: Payer: Self-pay | Admitting: Cardiovascular Disease

## 2020-03-05 ENCOUNTER — Ambulatory Visit: Payer: PPO | Admitting: Cardiovascular Disease

## 2020-03-05 ENCOUNTER — Other Ambulatory Visit: Payer: Self-pay

## 2020-03-05 ENCOUNTER — Encounter: Payer: Self-pay | Admitting: Cardiovascular Disease

## 2020-03-05 DIAGNOSIS — I723 Aneurysm of iliac artery: Secondary | ICD-10-CM | POA: Diagnosis not present

## 2020-03-05 DIAGNOSIS — Z5181 Encounter for therapeutic drug level monitoring: Secondary | ICD-10-CM | POA: Diagnosis not present

## 2020-03-05 DIAGNOSIS — I10 Essential (primary) hypertension: Secondary | ICD-10-CM | POA: Diagnosis not present

## 2020-03-05 DIAGNOSIS — E785 Hyperlipidemia, unspecified: Secondary | ICD-10-CM

## 2020-03-05 DIAGNOSIS — I25810 Atherosclerosis of coronary artery bypass graft(s) without angina pectoris: Secondary | ICD-10-CM | POA: Diagnosis not present

## 2020-03-05 DIAGNOSIS — I6523 Occlusion and stenosis of bilateral carotid arteries: Secondary | ICD-10-CM

## 2020-03-05 MED ORDER — PANTOPRAZOLE SODIUM 40 MG PO TBEC: 40.0000 mg | DELAYED_RELEASE_TABLET | Freq: Every day | ORAL | 3 refills | Status: DC

## 2020-03-05 MED ORDER — FISH OIL 1200 MG PO CAPS: 1.0000 | ORAL_CAPSULE | Freq: Two times a day (BID) | ORAL | 3 refills | Status: AC

## 2020-03-05 MED ORDER — LISINOPRIL 2.5 MG PO TABS: 2.5000 mg | ORAL_TABLET | Freq: Every day | ORAL | 3 refills | Status: DC

## 2020-03-05 MED ORDER — FOLBIC 2.5-25-2 MG PO TABS: 1.0000 | ORAL_TABLET | Freq: Every day | ORAL | 3 refills | Status: DC

## 2020-03-05 MED ORDER — ROSUVASTATIN CALCIUM 40 MG PO TABS: 40.0000 mg | ORAL_TABLET | Freq: Every day | ORAL | 3 refills | Status: DC

## 2020-03-05 MED ORDER — CLOPIDOGREL BISULFATE 75 MG PO TABS: 75.0000 mg | ORAL_TABLET | Freq: Every day | ORAL | 3 refills | Status: DC

## 2020-03-05 MED ORDER — AMLODIPINE BESYLATE 10 MG PO TABS: 10.0000 mg | ORAL_TABLET | Freq: Every day | ORAL | 3 refills | Status: DC

## 2020-03-05 MED ORDER — METOPROLOL SUCCINATE ER 25 MG PO TB24: 25.0000 mg | ORAL_TABLET | Freq: Every day | ORAL | 3 refills | Status: DC

## 2020-03-05 NOTE — Patient Instructions (Signed)
Medication Instructions:  Your physician recommends that you continue on your current medications as directed. Please refer to the Current Medication list given to you today.  *If you need a refill on your cardiac medications before your next appointment, please call your pharmacy*  Lab Work: FASTING LP/CMET/CBC/TSH SOON   If you have labs (blood work) drawn today and your tests are completely normal, you will receive your results only by: Marland Kitchen MyChart Message (if you have MyChart) OR . A paper copy in the mail If you have any lab test that is abnormal or we need to change your treatment, we will call you to review the results.  Testing/Procedures: GET STUDIES AS SCHEDULED   Follow-Up: At [facility], you and your health needs are our priority.  As part of our continuing mission to provide you with exceptional heart care, we have created designated Provider Care Teams.  These Care Teams include your primary Cardiologist (physician) and Advanced Practice Providers (APPs -  Physician Assistants and Nurse Practitioners) who all work together to provide you with the care you need, when you need it.  We recommend signing up for the patient portal called "MyChart".  Sign up information is provided on this After Visit Summary.  MyChart is used to connect with patients for Virtual Visits (Telemedicine).  Patients are able to view lab/test results, encounter notes, upcoming appointments, etc.  Non-urgent messages can be sent to your provider as well.   To learn more about what you can do with MyChart, go to [email or web address].    Your next appointment:   12 month(s)  The format for your next appointment:   In Person  Provider:   You may see [name], MD or one of the following Advanced Practice Providers on your designated Care Team:    Kerin Ransom, PA-C  [place], [place]  Coletta Memos, [place]

## 2020-03-05 NOTE — Progress Notes (Signed)
Patient ID: Frederick Kirby, male   DOB: 1935/07/27, 85 y.o.   MRN: [id number]     HPI: Frederick Kirby is a 85 y.o. male who presents for a 22 month cardiology evaluation.  Frederick Kirby has a history of coronary as well as peripheral vascular disease. In 1994 he underwent CABG revascularization surgery( LIMA to the LAD, sequential vein to the OM1 and OM2, sequential vein to the PDA and PLA vessel). In April 2005, a 2.5x24 mm Taxus stent was inserted into the vein graft supplying the ramus intermediate vessel. At that time, his RCA graft was occluded and he also had an occluded graft to the marginal vessel but had a patent LIMA to the LAD.   Additional problems include mixed hyperlipidemia, documented iliac artery aneurysm, mild renal insufficiency, hypertension, as well as hyperlipidemia. He's also had instances with stage II hypertension requiring medication adjustment. He has a history of renal insufficiency  which did improve with reduction of his lisinopril from 20 mg to 10 mg and further titration his metoprolol extended release 75 mg.    Has a history of hyperlipidemia on Crestor 40 mg daily , and lipid studies in March 2015 revealed cholesterol 131, triglycerides 153, HDL 47, LDL 63.  In March 2015 abdominal aortic ultrasound demonstrated  mild amount of nonhemodynamically significant plaque.  There also is mild aneurysmal dilatation of his right common iliac artery , which measured 2.0 x 2.2 cm, unchanged from two years previously.  He has carotid disease with a carotid duplex study in January 2017 demonstrating a 60-79% right internal carotid stenosis and 40-59% left internal carotid artery stenosis.   He has a history of chronic kidney disease, stage III.  His last serum creatinine was 1.71  He underwent a follow-up carotid study on February 2018 which actually was improved from previously and showed 40-59% right internal carotid stenosis in the left internal carotid stenosis is in the 1-39%  range.  He has remained active.  When I saw him one year ago, he denied but has tolerated any episodes of chest pain, PND or orthopnea. In the past he had been started on irbesartan, but apparently did not tolerate this well since When I last saw him one year ago, he was on over the past year, he has remained lisinopril at 2.5 mg mg daily in addition to amlodipine 10 mg and Toprol-XL 75 mg   I last saw him in March 2019.  At that time he denied any chest pain and continued to be active.  He denied PND orthopnea.   He underwent a one-year follow-up carotid duplex scan on February 20, 2017 which again remains stable with 40-59% right ICA narrowing and 1 at 39% left carotid narrowing.  There is 50% stenoses bilaterally in the external carotids.  Vertebral arteries were patent with antegrade flow.  He had normal subclavian flow. He has not had recent lab work.  He continues to be on rosuvastatin 40 mg and fish oil for mixed hyperlipidemia.  He was on omeprazole for GERD and continue to be on clopidogrel.  He did not take aspirin due to history of ulcer disease.  Since I saw him, he was evaluated in a telemedicine visit in October 2020 by Almyra Deforest, PA and was remaining stable.  \He underwent follow-up carotid and abdominal aortic aneurysm Doppler studies in March 2021.  He again was found to have mild to moderate disease in the right carotid in the 40 to 59% range and left  carotid in the 1 to 39% range.  He had bilateral antegrade flow.  There was disturbed velocity in the left subclavian artery.  There was no evidence for any abdominal aortic aneurysm.  His documented iliac artery aneurysm was stable in size at 2.1 cm.  Presently, he feels well.  He denies chest pain PND orthopnea.  He has not had recent laboratory.  He continues to be on Plavix but is not on aspirin due to a prior bleeding ulcer.  He continues to be on amlodipine 10 mg, lisinopril 2.5 mg and metoprolol succinate 25 mg for hypertension.  He is  tolerating rosuvastatin 40 mg and over-the-counter fish oil for mixed hyperlipidemia.  GERD is controlled with pantoprazole.  He presents for evaluation.  Past Medical History:  Diagnosis Date  . COPD (chronic obstructive pulmonary disease) ([place])   . Coronary artery disease   . Hyperlipidemia   . Hypertension   . Mild renal insufficiency   . Myocardial infarction ([place])   . S/P CABG (coronary artery bypass graft) 1994  . Stroke [facility])     Past Surgical History:  Procedure Laterality Date  . CAROTID DOPPLER  12/2011   Right Bulb/Prox ICA 50-69% diameter reduction; Left Bulb/Prox ICA 0-49% diameter reduction  . CORONARY ANGIOPLASTY WITH STENT PLACEMENT  04/28/2003   L Cfx with 99% prox lesion in AV groove; patent SVG to ramus intermedius w/ 95% distal lesion; SVG to OM totally occluded; SVG to RCA totally occluded; native RCA occluded in prox region w/collaterals from LAD into PDA; Taxus 2.5x45mm stent to distal VG to ramus (Dr. Gerrie Nordmann)  . CORONARY ARTERY BYPASS GRAFT  01/1992   LIMA-LAD & diagonal, SVG to diagonal, SVG to OM1 & OM2, VG to PDA & PLA (Dr. Redmond Pulling)  . NM MYOCAR PERF WALL MOTION  11/2011   lexiscan - normal study, EF 80% post-stress  . RENAL DOPPLER  12/2011   Right CIA with aneursymal dilatation 2.3x3.1  . TRANSTHORACIC ECHOCARDIOGRAM  10/2008   EF 50-55%, borderline conc LVH, mild posterior wall hypokinesis; mild mitral annular calcification, prolapse of anterior leaflets, mild MR; RVSP 30-54mmHg; mild TR; mild calcification of AV leaflets    Allergies  Allergen Reactions  . Niaspan [Niacin Er]     Turned red & his skin burned    Current Outpatient Medications  Medication Sig Dispense Refill  . amLODipine (NORVASC) 10 MG tablet Take 1 tablet (10 mg total) by mouth daily. 90 tablet 3  . clopidogrel (PLAVIX) 75 MG tablet Take 1 tablet (75 mg total) by mouth daily. 90 tablet 3  . FOLBIC 2.5-25-2 MG TABS tablet Take 1 tablet by mouth daily. 90 tablet 3  . lisinopril  (ZESTRIL) 2.5 MG tablet Take 1 tablet (2.5 mg total) by mouth daily. 90 tablet 3  . metoprolol succinate (TOPROL-XL) 25 MG 24 hr tablet Take 1 tablet (25 mg total) by mouth daily. 90 tablet 3  . Omega-3 Fatty Acids (FISH OIL) 1200 MG CAPS Take 1 capsule (1,200 mg total) by mouth 2 (two) times daily. 180 capsule 3  . pantoprazole (PROTONIX) 40 MG tablet Take 1 tablet (40 mg total) by mouth daily. 90 tablet 3  . rosuvastatin (CRESTOR) 40 MG tablet Take 1 tablet (40 mg total) by mouth daily. 90 tablet 3   No current facility-administered medications for this visit.    Social History   Socioeconomic History  . Marital status: Single    Spouse name: Not on file  . Number of children: Not  on file  . Years of education: Not on file  . Highest education level: Not on file  Occupational History  . Not on file  Tobacco Use  . Smoking status: Former Smoker    Quit date: 01/13/2005    Years since quitting: 15.1  . Smokeless tobacco: Never Used  Substance and Sexual Activity  . Alcohol use: No  . Drug use: No  . Sexual activity: Not Currently  Other Topics Concern  . Not on file  Social History Narrative  . Not on file   Social Determinants of Health   Financial Resource Strain: Not on file  Food Insecurity: Not on file  Transportation Needs: Not on file  Physical Activity: Not on file  Stress: Not on file  Social Connections: Not on file  Intimate Partner Violence: Not on file   Social history is notable that he is married. He is over 18 grandchildren and at least 7 great grandchildren. He quit smoking 8 years ago. He remains active the  Family History  Problem Relation Age of Onset  . Cancer Mother     ROS General: Negative; No fevers, chills, or night sweats;  HEENT: [place] for sinus drainage; No changes in vision or hearing, sinus congestion, difficulty swallowing Pulmonary: Positive for questionable wheezing.; No cough, shortness of breath, hemoptysis Cardiovascular: see  HPI; No chest pain, presyncope, syncope, palpatations; occasional left ankle swelling GI: Negative; No nausea, vomiting, diarrhea, or abdominal pain GU: Negative; No dysuria, hematuria, or difficulty voiding Musculoskeletal: Negative; no myalgias, joint pain, or weakness Hematologic/Oncology: Negative; no easy bruising, bleeding Endocrine: Negative; no heat/cold intolerance; no diabetes Neuro: Negative; no changes in balance, headaches Skin: Negative; No rashes or skin lesions Psychiatric: Negative; No behavioral problems, depression Sleep: Negative; No snoring, daytime sleepiness, hypersomnolence, bruxism, restless legs, hypnogognic hallucinations, no cataplexy Other comprehensive 14 point system review is negative.   Physical Exam BP (!) 128/58   Pulse 78   Ht $R'5\' 9"'AY$  (1.753 m)   Wt 181 lb 12.8 oz (82.5 kg)   SpO2 99%   BMI 26.85 kg/m    Repeat blood pressure by me was 120/60  Wt Readings from Last 3 Encounters:  03/05/20 181 lb 12.8 oz (82.5 kg)  10/20/18 184 lb 9.6 oz (83.7 kg)  04/08/17 182 lb 12.8 oz (82.9 kg)   General: Alert, oriented, no distress.  Skin: normal turgor, no rashes, warm and dry HEENT: Normocephalic, atraumatic. Pupils equal round and reactive to light; sclera anicteric; extraocular muscles intact;  Nose without nasal septal hypertrophy Mouth/Parynx benign; Mallinpatti scale 3 Neck: No JVD, no carotid bruits; normal carotid upstroke Lungs: clear to ausculatation and percussion; no wheezing or rales Chest wall: without tenderness to palpitation Heart: PMI not displaced, RRR, s1 s2 normal, 1/6 systolic murmur, no diastolic murmur, no rubs, gallops, thrills, or heaves Abdomen: soft, nontender; no hepatosplenomehaly, BS+; abdominal aorta nontender and not dilated by palpation. Back: no CVA tenderness Pulses 2+ Musculoskeletal: full range of motion, normal strength, no joint deformities Extremities: no clubbing cyanosis or edema, Homan's sign negative   Neurologic: grossly nonfocal; Cranial nerves grossly wnl Psychologic: Normal mood and affect   ECG (independently read by me): NSR at 78; nonspecific ST chhanges  March 2019 ECG (independently read by me): Normal sinus rhythm at 68 bpm.  First-degree AV block with a PR interval of $RemoveBef'2 3 2 '[id number]$ ms.  No ectopy.  No ST segment changes.  March 2018  ECG (independently read by me): NSR  At 78 with  1st degree AV block  September 2017 ECG (independently read by me): Sinus rhythm with first degree AV block with a PR interval of 270 ms.  QTc interval is normal.  There are no significant ST-T changes.  November 2016 ECG (independently read by me): Sinus bradycardia 58 bpm.,  First-degree AV block with increased PR interval at 272 ms.  ECG (independently read by me): normal sinus rhythm at 60 bpm.  First-degree AV block.  No significant ST-T changes.  QTc interval normal.  May 2015 ECG (independently read by me): Normal sinus rhythm at 62 beats per minute.  First degree AV block.  Prior ECG: Normal sinus rhythm at 69 beats per minute. First degree AV block with PR interval 236 ms. QTc interval normal at 420 ms.  LABS: BMP Latest Ref Rng & Units 12/24/2015 09/14/2015 03/08/2015  Glucose 65 - 99 mg/dL 105(H) 106(H) 103(H)  BUN 7 - 25 mg/dL $Remove'15 12 14  'tUUDmxi$ Creatinine 0.70 - 1.18 mg/dL 1.66(H) 1.77(H) 1.71(H)  Sodium 135 - 146 mmol/L 137 134(L) 137  Potassium 3.5 - 5.3 mmol/L 4.5 4.6 4.6  Chloride 98 - 110 mmol/L 102 97(L) 102  CO2 20 - 31 mmol/L $RemoveB'27 27 28  'ZMpFAFyM$ Calcium 8.6 - 10.3 mg/dL 9.3 9.4 9.0   Hepatic Function Latest Ref Rng & Units 09/14/2015 02/08/2015 03/30/2013  Total Protein 6.1 - 8.1 g/dL 7.7 6.5 7.0  Albumin 3.6 - 5.1 g/dL 4.8 3.9 4.3  AST 10 - 35 U/L $Remo'29 16 23  'OyHCc$ ALT 9 - 46 U/L 30 17 33  Alk Phosphatase 40 - 115 U/L 76 65 89  Total Bilirubin 0.2 - 1.2 mg/dL 0.5 0.5 0.5   CBC Latest Ref Rng & Units 09/14/2015 02/08/2015 12/04/2013  WBC 3.8 - 10.8 K/uL 6.9 8.5 12.4(H)  Hemoglobin 13.2 - 17.1 g/dL 14.5 13.1  13.0  Hematocrit 38.5 - 50.0 % 42.6 39.1 38.7(L)  Platelets 140 - 400 K/uL 231 207 215   Lab Results  Component Value Date   MCV 90.1 09/14/2015   MCV 90.5 02/08/2015   MCV 88.6 12/04/2013   Lab Results  Component Value Date   TSH 7.95 (H) 12/24/2015  No results found for: HGBA1C   Lipid Panel     Component Value Date/Time   CHOL 148 09/14/2015 0854   TRIG 159 (H) 09/14/2015 0854   HDL 49 09/14/2015 0854   CHOLHDL 3.0 09/14/2015 0854   VLDL 32 (H) 09/14/2015 0854   LDLCALC 67 09/14/2015 0854    RADIOLOGY: No results found.  IMPRESSION: 1. Coronary artery disease involving coronary bypass graft of native heart without angina pectoris   2. Essential hypertension   3. Hyperlipidemia with target LDL less than 70   4. Therapeutic drug monitoring   5. Bilateral carotid artery stenosis   6. Iliac artery aneurysm [facility])    ASSESSMENT AND PLAN: Frederick Kirby is an 85 year-old Caucasian male who is 28 years status post CABG revascularization surgery with LIMA to his LAD, sequential graft to the OM1 and OM2, and sequential graft to the PDA and PLA vessel. He is 17 years since undergoing intervention to the vein graft supplying the intermediate vessel.  At that time he had an occluded graft to the RCA, as well as an occluded graft to the marginal vessel.  Presently, he remains stable without anginal symptomatology and denies any exertional shortness of breath, PND orthopnea.  His blood pressure today is stable on his regimen consisting of amlodipine 10 mg, lisinopril 2.5 mg,  metoprolol succinate 25 mg daily.  He continues to be on over-the-counter fish oil and rosuvastatin 40 mg for mixed hyperlipidemia.  He has not had recent laboratory.  I will obtain fasting labs with a comprehensive metabolic panel, CBC, TSH, lipid studies and hemoglobin A1c.  I will contact him regarding the results adjustments will be made to his medical regimen if necessary.  As long as he remains stable, I will see  him in 1 year for reevaluation or sooner as needed.   [name], MD, [facility]  03/07/2020 1:41 PM

## 2020-03-07 ENCOUNTER — Encounter: Payer: Self-pay | Admitting: Cardiovascular Disease

## 2020-03-15 DIAGNOSIS — I10 Essential (primary) hypertension: Secondary | ICD-10-CM | POA: Diagnosis not present

## 2020-03-15 DIAGNOSIS — Z5181 Encounter for therapeutic drug level monitoring: Secondary | ICD-10-CM | POA: Diagnosis not present

## 2020-03-15 DIAGNOSIS — E785 Hyperlipidemia, unspecified: Secondary | ICD-10-CM | POA: Diagnosis not present

## 2020-03-16 LAB — CBC WITH DIFFERENTIAL/PLATELET
Basophils Absolute: 0 10*3/uL (ref 0.0–0.2)
Basos: 1 %
EOS (ABSOLUTE): 0.2 10*3/uL (ref 0.0–0.4)
Eos: 3 %
Hematocrit: 32.1 % — ABNORMAL LOW (ref 37.5–51.0)
Hemoglobin: 9.8 g/dL — ABNORMAL LOW (ref 13.0–17.7)
Immature Grans (Abs): 0 10*3/uL (ref 0.0–0.1)
Immature Granulocytes: 0 %
Lymphocytes Absolute: 1.7 10*3/uL (ref 0.7–3.1)
Lymphs: 29 %
MCH: 24.7 pg — ABNORMAL LOW (ref 26.6–33.0)
MCHC: 30.5 g/dL — ABNORMAL LOW (ref 31.5–35.7)
MCV: 81 fL (ref 79–97)
Monocytes Absolute: 0.7 10*3/uL (ref 0.1–0.9)
Monocytes: 11 %
Neutrophils Absolute: 3.3 10*3/uL (ref 1.4–7.0)
Neutrophils: 56 %
Platelets: 285 10*3/uL (ref 150–450)
RBC: 3.97 x10E6/uL — ABNORMAL LOW (ref 4.14–5.80)
RDW: 14.6 % (ref 11.6–15.4)
WBC: 5.9 10*3/uL (ref 3.4–10.8)

## 2020-03-16 LAB — COMPREHENSIVE METABOLIC PANEL
ALT: 27 IU/L (ref 0–44)
AST: 27 IU/L (ref 0–40)
Albumin/Globulin Ratio: 1.8 (ref 1.2–2.2)
Albumin: 4.7 g/dL — ABNORMAL HIGH (ref 3.6–4.6)
Alkaline Phosphatase: 90 IU/L (ref 44–121)
BUN/Creatinine Ratio: 11 (ref 10–24)
BUN: 18 mg/dL (ref 8–27)
Bilirubin Total: 0.3 mg/dL (ref 0.0–1.2)
CO2: 21 mmol/L (ref 20–29)
Calcium: 9.4 mg/dL (ref 8.6–10.2)
Chloride: 103 mmol/L (ref 96–106)
Creatinine, Ser: 1.66 mg/dL — ABNORMAL HIGH (ref 0.76–1.27)
Globulin, Total: 2.6 g/dL (ref 1.5–4.5)
Glucose: 108 mg/dL — ABNORMAL HIGH (ref 65–99)
Potassium: 4.6 mmol/L (ref 3.5–5.2)
Sodium: 139 mmol/L (ref 134–144)
Total Protein: 7.3 g/dL (ref 6.0–8.5)
eGFR: 41 mL/min/{1.73_m2} — ABNORMAL LOW (ref >59)

## 2020-03-16 LAB — HEMOGLOBIN A1C
Est. average glucose Bld gHb Est-mCnc: 128 mg/dL
Hgb A1c MFr Bld: 6.1 % — ABNORMAL HIGH (ref 4.8–5.6)

## 2020-03-16 LAB — TSH: TSH: 6.13 u[IU]/mL — ABNORMAL HIGH (ref 0.450–4.500)

## 2020-03-16 LAB — LIPID PANEL
Chol/HDL Ratio: 3.4 ratio (ref 0.0–5.0)
Cholesterol, Total: 152 mg/dL (ref 100–199)
HDL: 45 mg/dL (ref >39)
LDL Chol Calc (NIH): 82 mg/dL (ref 0–99)
Triglycerides: 142 mg/dL (ref 0–149)
VLDL Cholesterol Cal: 25 mg/dL (ref 5–40)

## 2020-04-05 ENCOUNTER — Other Ambulatory Visit: Payer: Self-pay

## 2020-04-05 ENCOUNTER — Ambulatory Visit (HOSPITAL_COMMUNITY)
Admission: RE | Admit: 2020-04-05 | Discharge: 2020-04-05 | Disposition: A | Discharge status: Home / Self Care | Payer: PPO | Source: Ambulatory Visit | Attending: Cardiovascular Disease | Admitting: Cardiovascular Disease

## 2020-04-05 ENCOUNTER — Other Ambulatory Visit (HOSPITAL_COMMUNITY): Payer: Self-pay | Admitting: Cardiovascular Disease

## 2020-04-05 ENCOUNTER — Ambulatory Visit (HOSPITAL_BASED_OUTPATIENT_CLINIC_OR_DEPARTMENT_OTHER)
Admission: RE | Admit: 2020-04-05 | Discharge: 2020-04-05 | Disposition: A | Discharge status: Home / Self Care | Payer: PPO | Source: Ambulatory Visit | Attending: Cardiovascular Disease | Admitting: Cardiovascular Disease

## 2020-04-05 DIAGNOSIS — I723 Aneurysm of iliac artery: Secondary | ICD-10-CM

## 2020-04-05 DIAGNOSIS — I6521 Occlusion and stenosis of right carotid artery: Secondary | ICD-10-CM | POA: Diagnosis not present

## 2020-04-05 DIAGNOSIS — I6523 Occlusion and stenosis of bilateral carotid arteries: Secondary | ICD-10-CM

## 2020-09-01 ENCOUNTER — Other Ambulatory Visit: Payer: Self-pay | Admitting: Cardiovascular Disease

## 2020-11-10 ENCOUNTER — Inpatient Hospital Stay (HOSPITAL_COMMUNITY)
Admission: EM | Admit: 2020-11-10 | Discharge: 2020-11-14 | DRG: 812 | Disposition: A | Discharge status: Home Health | Payer: PPO | Attending: Internal Medicine | Admitting: Internal Medicine

## 2020-11-10 ENCOUNTER — Emergency Department (HOSPITAL_COMMUNITY): Payer: PPO

## 2020-11-10 ENCOUNTER — Other Ambulatory Visit: Payer: Self-pay

## 2020-11-10 ENCOUNTER — Encounter (HOSPITAL_COMMUNITY): Payer: Self-pay | Admitting: Emergency Medicine

## 2020-11-10 DIAGNOSIS — Z809 Family history of malignant neoplasm, unspecified: Secondary | ICD-10-CM | POA: Diagnosis not present

## 2020-11-10 DIAGNOSIS — Z955 Presence of coronary angioplasty implant and graft: Secondary | ICD-10-CM

## 2020-11-10 DIAGNOSIS — N1832 Chronic kidney disease, stage 3b: Secondary | ICD-10-CM | POA: Diagnosis present

## 2020-11-10 DIAGNOSIS — Z888 Allergy status to other drugs, medicaments and biological substances status: Secondary | ICD-10-CM

## 2020-11-10 DIAGNOSIS — Z7902 Long term (current) use of antithrombotics/antiplatelets: Secondary | ICD-10-CM | POA: Diagnosis not present

## 2020-11-10 DIAGNOSIS — R109 Unspecified abdominal pain: Secondary | ICD-10-CM | POA: Diagnosis not present

## 2020-11-10 DIAGNOSIS — D62 Acute posthemorrhagic anemia: Secondary | ICD-10-CM | POA: Diagnosis present

## 2020-11-10 DIAGNOSIS — I2510 Atherosclerotic heart disease of native coronary artery without angina pectoris: Secondary | ICD-10-CM | POA: Diagnosis present

## 2020-11-10 DIAGNOSIS — N281 Cyst of kidney, acquired: Secondary | ICD-10-CM | POA: Diagnosis not present

## 2020-11-10 DIAGNOSIS — Z20822 Contact with and (suspected) exposure to covid-19: Secondary | ICD-10-CM | POA: Diagnosis present

## 2020-11-10 DIAGNOSIS — R059 Cough, unspecified: Secondary | ICD-10-CM

## 2020-11-10 DIAGNOSIS — N179 Acute kidney failure, unspecified: Secondary | ICD-10-CM | POA: Diagnosis present

## 2020-11-10 DIAGNOSIS — M542 Cervicalgia: Secondary | ICD-10-CM | POA: Diagnosis not present

## 2020-11-10 DIAGNOSIS — I10 Essential (primary) hypertension: Secondary | ICD-10-CM | POA: Diagnosis not present

## 2020-11-10 DIAGNOSIS — D649 Anemia, unspecified: Secondary | ICD-10-CM | POA: Diagnosis not present

## 2020-11-10 DIAGNOSIS — Z6828 Body mass index (BMI) 28.0-28.9, adult: Secondary | ICD-10-CM

## 2020-11-10 DIAGNOSIS — Z79899 Other long term (current) drug therapy: Secondary | ICD-10-CM | POA: Diagnosis not present

## 2020-11-10 DIAGNOSIS — R490 Dysphonia: Secondary | ICD-10-CM | POA: Diagnosis not present

## 2020-11-10 DIAGNOSIS — K5909 Other constipation: Secondary | ICD-10-CM | POA: Diagnosis present

## 2020-11-10 DIAGNOSIS — Z87891 Personal history of nicotine dependence: Secondary | ICD-10-CM | POA: Diagnosis not present

## 2020-11-10 DIAGNOSIS — I252 Old myocardial infarction: Secondary | ICD-10-CM

## 2020-11-10 DIAGNOSIS — Z8673 Personal history of transient ischemic attack (TIA), and cerebral infarction without residual deficits: Secondary | ICD-10-CM | POA: Diagnosis not present

## 2020-11-10 DIAGNOSIS — R0602 Shortness of breath: Secondary | ICD-10-CM | POA: Diagnosis not present

## 2020-11-10 DIAGNOSIS — I129 Hypertensive chronic kidney disease with stage 1 through stage 4 chronic kidney disease, or unspecified chronic kidney disease: Secondary | ICD-10-CM | POA: Diagnosis present

## 2020-11-10 DIAGNOSIS — R7989 Other specified abnormal findings of blood chemistry: Secondary | ICD-10-CM | POA: Diagnosis present

## 2020-11-10 DIAGNOSIS — J90 Pleural effusion, not elsewhere classified: Secondary | ICD-10-CM | POA: Diagnosis not present

## 2020-11-10 DIAGNOSIS — I700 Atherosclerosis of aorta: Secondary | ICD-10-CM | POA: Diagnosis not present

## 2020-11-10 DIAGNOSIS — R531 Weakness: Secondary | ICD-10-CM | POA: Diagnosis not present

## 2020-11-10 DIAGNOSIS — D509 Iron deficiency anemia, unspecified: Secondary | ICD-10-CM | POA: Diagnosis present

## 2020-11-10 DIAGNOSIS — I959 Hypotension, unspecified: Secondary | ICD-10-CM | POA: Diagnosis not present

## 2020-11-10 DIAGNOSIS — K8020 Calculus of gallbladder without cholecystitis without obstruction: Secondary | ICD-10-CM | POA: Diagnosis not present

## 2020-11-10 DIAGNOSIS — I25810 Atherosclerosis of coronary artery bypass graft(s) without angina pectoris: Secondary | ICD-10-CM | POA: Diagnosis present

## 2020-11-10 DIAGNOSIS — R9431 Abnormal electrocardiogram [ECG] [EKG]: Secondary | ICD-10-CM | POA: Diagnosis not present

## 2020-11-10 DIAGNOSIS — R932 Abnormal findings on diagnostic imaging of liver and biliary tract: Secondary | ICD-10-CM | POA: Diagnosis not present

## 2020-11-10 DIAGNOSIS — Z8719 Personal history of other diseases of the digestive system: Secondary | ICD-10-CM | POA: Diagnosis not present

## 2020-11-10 DIAGNOSIS — J9811 Atelectasis: Secondary | ICD-10-CM | POA: Diagnosis not present

## 2020-11-10 DIAGNOSIS — R7401 Elevation of levels of liver transaminase levels: Secondary | ICD-10-CM | POA: Diagnosis not present

## 2020-11-10 DIAGNOSIS — E785 Hyperlipidemia, unspecified: Secondary | ICD-10-CM | POA: Diagnosis present

## 2020-11-10 DIAGNOSIS — J449 Chronic obstructive pulmonary disease, unspecified: Secondary | ICD-10-CM | POA: Diagnosis present

## 2020-11-10 DIAGNOSIS — Z8711 Personal history of peptic ulcer disease: Secondary | ICD-10-CM

## 2020-11-10 DIAGNOSIS — R001 Bradycardia, unspecified: Secondary | ICD-10-CM | POA: Diagnosis not present

## 2020-11-10 DIAGNOSIS — D500 Iron deficiency anemia secondary to blood loss (chronic): Secondary | ICD-10-CM | POA: Diagnosis not present

## 2020-11-10 DIAGNOSIS — N402 Nodular prostate without lower urinary tract symptoms: Secondary | ICD-10-CM | POA: Diagnosis not present

## 2020-11-10 LAB — BASIC METABOLIC PANEL
Anion gap: 14 (ref 5–15)
BUN: 54 mg/dL — ABNORMAL HIGH (ref 8–23)
CO2: 19 mmol/L — ABNORMAL LOW (ref 22–32)
Calcium: 8.8 mg/dL — ABNORMAL LOW (ref 8.9–10.3)
Chloride: 101 mmol/L (ref 98–111)
Creatinine, Ser: 2.95 mg/dL — ABNORMAL HIGH (ref 0.61–1.24)
GFR, Estimated: 20 mL/min — ABNORMAL LOW (ref >60)
Glucose, Bld: 113 mg/dL — ABNORMAL HIGH (ref 70–99)
Potassium: 4.9 mmol/L (ref 3.5–5.1)
Sodium: 134 mmol/L — ABNORMAL LOW (ref 135–145)

## 2020-11-10 LAB — CBC
HCT: 24.8 % — ABNORMAL LOW (ref 39.0–52.0)
Hemoglobin: 7.2 g/dL — ABNORMAL LOW (ref 13.0–17.0)
MCH: 20.2 pg — ABNORMAL LOW (ref 26.0–34.0)
MCHC: 29 g/dL — ABNORMAL LOW (ref 30.0–36.0)
MCV: 69.7 fL — ABNORMAL LOW (ref 80.0–100.0)
Platelets: 251 10*3/uL (ref 150–400)
RBC: 3.56 MIL/uL — ABNORMAL LOW (ref 4.22–5.81)
RDW: 19.6 % — ABNORMAL HIGH (ref 11.5–15.5)
WBC: 9.9 10*3/uL (ref 4.0–10.5)
nRBC: 0.3 % — ABNORMAL HIGH (ref 0.0–0.2)

## 2020-11-10 LAB — RESP PANEL BY RT-PCR (FLU A&B, COVID) ARPGX2
Influenza A by PCR: NEGATIVE
Influenza B by PCR: NEGATIVE
SARS Coronavirus 2 by RT PCR: NEGATIVE

## 2020-11-10 LAB — CBC WITH DIFFERENTIAL/PLATELET
Abs Immature Granulocytes: 0.04 10*3/uL (ref 0.00–0.07)
Basophils Absolute: 0 10*3/uL (ref 0.0–0.1)
Basophils Relative: 0 %
Eosinophils Absolute: 0 10*3/uL (ref 0.0–0.5)
Eosinophils Relative: 0 %
HCT: 22.5 % — ABNORMAL LOW (ref 39.0–52.0)
Hemoglobin: 6.5 g/dL — CL (ref 13.0–17.0)
Immature Granulocytes: 0 %
Lymphocytes Relative: 8 %
Lymphs Abs: 0.9 10*3/uL (ref 0.7–4.0)
MCH: 19.7 pg — ABNORMAL LOW (ref 26.0–34.0)
MCHC: 28.9 g/dL — ABNORMAL LOW (ref 30.0–36.0)
MCV: 68.2 fL — ABNORMAL LOW (ref 80.0–100.0)
Monocytes Absolute: 1.5 10*3/uL — ABNORMAL HIGH (ref 0.1–1.0)
Monocytes Relative: 13 %
Neutro Abs: 8.9 10*3/uL — ABNORMAL HIGH (ref 1.7–7.7)
Neutrophils Relative %: 79 %
Platelets: 346 10*3/uL (ref 150–400)
RBC: 3.3 MIL/uL — ABNORMAL LOW (ref 4.22–5.81)
RDW: 18.8 % — ABNORMAL HIGH (ref 11.5–15.5)
WBC: 11.4 10*3/uL — ABNORMAL HIGH (ref 4.0–10.5)
nRBC: 0.4 % — ABNORMAL HIGH (ref 0.0–0.2)

## 2020-11-10 LAB — CREATININE, SERUM
Creatinine, Ser: 2.66 mg/dL — ABNORMAL HIGH (ref 0.61–1.24)
GFR, Estimated: 23 mL/min — ABNORMAL LOW (ref >60)

## 2020-11-10 LAB — HEPATIC FUNCTION PANEL
ALT: 380 U/L — ABNORMAL HIGH (ref 0–44)
AST: 412 U/L — ABNORMAL HIGH (ref 15–41)
Albumin: 3.6 g/dL (ref 3.5–5.0)
Alkaline Phosphatase: 59 U/L (ref 38–126)
Bilirubin, Direct: 0.1 mg/dL (ref 0.0–0.2)
Indirect Bilirubin: 0.8 mg/dL (ref 0.3–0.9)
Total Bilirubin: 0.9 mg/dL (ref 0.3–1.2)
Total Protein: 6.4 g/dL — ABNORMAL LOW (ref 6.5–8.1)

## 2020-11-10 LAB — BRAIN NATRIURETIC PEPTIDE: B Natriuretic Peptide: 527.4 pg/mL — ABNORMAL HIGH (ref 0.0–100.0)

## 2020-11-10 LAB — POC OCCULT BLOOD, ED: Fecal Occult Bld: NEGATIVE

## 2020-11-10 LAB — TROPONIN I (HIGH SENSITIVITY)
Troponin I (High Sensitivity): 137 ng/L (ref <18)
Troponin I (High Sensitivity): 152 ng/L (ref <18)

## 2020-11-10 LAB — PREPARE RBC (CROSSMATCH)

## 2020-11-10 MED ORDER — ROSUVASTATIN CALCIUM 20 MG PO TABS: 40.0000 mg | ORAL_TABLET | Freq: Every day | ORAL | Status: DC

## 2020-11-10 MED ORDER — CLOPIDOGREL BISULFATE 75 MG PO TABS: 75.0000 mg | ORAL_TABLET | Freq: Every day | ORAL | Status: DC

## 2020-11-10 MED ORDER — PANTOPRAZOLE SODIUM 40 MG PO TBEC
40.0000 mg | DELAYED_RELEASE_TABLET | Freq: Every day | ORAL | Status: DC
  Filled 2020-11-10: qty 1

## 2020-11-10 MED ORDER — FA-PYRIDOXINE-CYANOCOBALAMIN 2.5-25-2 MG PO TABS
1.0000 | ORAL_TABLET | Freq: Every day | ORAL | Status: DC
  Administered 2020-11-11 – 2020-11-14 (×4): 1 via ORAL
  Filled 2020-11-10 (×4): qty 1

## 2020-11-10 MED ORDER — OMEGA-3-ACID ETHYL ESTERS 1 G PO CAPS
1.0000 g | ORAL_CAPSULE | Freq: Every day | ORAL | Status: DC
  Administered 2020-11-10 – 2020-11-14 (×5): 1 g via ORAL
  Filled 2020-11-10 (×5): qty 1

## 2020-11-10 MED ORDER — METOPROLOL SUCCINATE ER 25 MG PO TB24
25.0000 mg | ORAL_TABLET | Freq: Every day | ORAL | Status: DC
  Administered 2020-11-11 – 2020-11-14 (×4): 25 mg via ORAL
  Filled 2020-11-10 (×4): qty 1

## 2020-11-10 MED ORDER — HEPARIN SODIUM (PORCINE) 5000 UNIT/ML IJ SOLN
5000.0000 [IU] | Freq: Three times a day (TID) | INTRAMUSCULAR | Status: DC
  Administered 2020-11-10 – 2020-11-11 (×2): 5000 [IU] via SUBCUTANEOUS
  Filled 2020-11-10 (×2): qty 1

## 2020-11-10 MED ORDER — AMLODIPINE BESYLATE 10 MG PO TABS
10.0000 mg | ORAL_TABLET | Freq: Every day | ORAL | Status: DC
  Administered 2020-11-11 – 2020-11-14 (×4): 10 mg via ORAL
  Filled 2020-11-10 (×4): qty 1

## 2020-11-10 MED ORDER — SODIUM CHLORIDE 0.9 % IV SOLN
10.0000 mL/h | Freq: Once | INTRAVENOUS | Status: AC
  Administered 2020-11-11: 10 mL/h via INTRAVENOUS

## 2020-11-10 MED ORDER — MAGNESIUM HYDROXIDE 400 MG/5ML PO SUSP
15.0000 mL | Freq: Once | ORAL | Status: AC
  Administered 2020-11-10: 15 mL via ORAL
  Filled 2020-11-10: qty 30

## 2020-11-10 MED ORDER — SODIUM CHLORIDE 0.9% IV SOLUTION: Freq: Once | INTRAVENOUS | Status: AC

## 2020-11-10 NOTE — H&P (Signed)
History and Physical    Frederick Kirby:[id number] DOB: 05/08/1935 DOA: 11/10/2020  PCP: [name], MD  Patient coming from: Home.  Chief Complaint: Hoarseness of voice and shortness of breath and fatigue.  HPI: [name] is a 85 y.o. male with history of CAD status post CABG and stenting, hypertension, chronic kidney disease stage III prior stroke and COPD presents to the ER because of increasing hoarseness of voice over the last 2 days with increasing fatigue weakness and shortness of breath.  Patient has been having upper respiratory tract symptoms for the last 1 week and has been having cough and hoarseness started last 2 days.  Over the last 2 days patient also has become fatigue and weakness.  Did not notice any blood in the stools.  Given the symptoms patient presents to the ER.  ED Course: In the ER patient has cough and hoarseness of voice.  Chest x-ray shows chronic bronchitis changes.  Labs are significant for hemoglobin of 6.5 which is decreased from March when it was 9.8 and creatinine has worsened from March when it was 1.6 it is 2.9 LFTs are markedly elevated with AST of 412 ALT of 380 total bilirubin being normal.  Patient stool for occult blood was negative.  High sensitive troponins were 137 and 152.  BNP 527.  Patient admitted for further work-up of symptomatic anemia with hoarseness of voice and elevated LFTs.  Swelling of the abdomen areas cannot rule out acute cholecystitis.  COVID test negative.  Review of Systems: As per HPI, rest all negative.   Past Medical History:  Diagnosis Date   COPD (chronic obstructive pulmonary disease) ([place])    Coronary artery disease    Hyperlipidemia    Hypertension    Mild renal insufficiency    Myocardial infarction ([place])    S/P CABG (coronary artery bypass graft) 1994   Stroke [facility])     Past Surgical History:  Procedure Laterality Date   CAROTID DOPPLER  12/2011   Right Bulb/Prox ICA 50-69% diameter  reduction; Left Bulb/Prox ICA 0-49% diameter reduction   CORONARY ANGIOPLASTY WITH STENT PLACEMENT  04/28/2003   L Cfx with 99% prox lesion in AV groove; patent SVG to ramus intermedius w/ 95% distal lesion; SVG to OM totally occluded; SVG to RCA totally occluded; native RCA occluded in prox region w/collaterals from LAD into PDA; Taxus 2.5x19mm stent to distal VG to ramus (Dr. Gerrie Nordmann)   CORONARY ARTERY BYPASS GRAFT  01/1992   LIMA-LAD & diagonal, SVG to diagonal, SVG to OM1 & OM2, VG to PDA & PLA (Dr. Redmond Pulling)   [place]  11/2011   lexiscan - normal study, EF 80% post-stress   RENAL DOPPLER  12/2011   Right CIA with aneursymal dilatation 2.3x3.1   TRANSTHORACIC ECHOCARDIOGRAM  10/2008   EF 50-55%, borderline conc LVH, mild posterior wall hypokinesis; mild mitral annular calcification, prolapse of anterior leaflets, mild MR; RVSP 30-70mmHg; mild TR; mild calcification of AV leaflets     reports that he quit smoking about 15 years ago. He has never used smokeless tobacco. He reports that he does not drink alcohol and does not use drugs.  Allergies  Allergen Reactions   Niaspan [Niacin Er]     Turned red & his skin burned    Family History  Problem Relation Age of Onset   Cancer Mother     Prior to Admission medications   Medication Sig Start Date End Date Taking?  Authorizing Provider  amLODipine (NORVASC) 10 MG tablet Take 1 tablet (10 mg total) by mouth daily. 03/05/20   [name], MD  clopidogrel (PLAVIX) 75 MG tablet Take 1 tablet (75 mg total) by mouth daily. 03/05/20   [name], MD  FOLBIC 2.5-25-2 MG TABS tablet Take 1 tablet by mouth daily. 03/05/20   [name], MD  lisinopril (ZESTRIL) 2.5 MG tablet TAKE 1 TABLET BY MOUTH EVERY DAY 09/04/20   [name], MD  metoprolol succinate (TOPROL-XL) 25 MG 24 hr tablet Take 1 tablet (25 mg total) by mouth daily. 03/05/20   [name], MD  Omega-3 Fatty Acids (FISH OIL) 1200 MG CAPS Take 1  capsule (1,200 mg total) by mouth 2 (two) times daily. 03/05/20   [name], MD  pantoprazole (PROTONIX) 40 MG tablet Take 1 tablet (40 mg total) by mouth daily. 03/05/20   [name], MD  rosuvastatin (CRESTOR) 40 MG tablet Take 1 tablet (40 mg total) by mouth daily. 03/05/20   [name], MD    Physical Exam: Constitutional: Moderately built and nourished. Vitals:   11/10/20 1830 11/10/20 1900 11/10/20 1957 11/10/20 2048  BP: (!) 117/45 (!) 118/47 (!) 121/49 (!) 115/53  Pulse: 63 67 68 (!) 48  Resp: (!) 24 (!) 23 18 18   Temp:   97.7 F (36.5 C) (!) 97.5 F (36.4 C)  TempSrc:   Oral Oral  SpO2: 95% 96% 94% 95%  Weight:    85.7 kg  Height:    5\' 9"  (1.753 m)   Eyes: Anicteric no pallor. ENMT: No discharge from the ears eyes nose and mouth. Neck: No mass felt.  No obvious tenderness on my exam.  No obvious stridor. Respiratory: No rhonchi or crepitations. Cardiovascular: S1-S2 heard. Abdomen: Soft nontender bowel sound present. Musculoskeletal: Mild edema. Skin: No rash. Neurologic: Alert awake oriented to time place and person.  Moves all extremities. Psychiatric: Appears normal.  Normal affect.   Labs on Admission: I have personally reviewed following labs and imaging studies  CBC: Recent Labs  Lab 11/10/20 0931  WBC 11.4*  NEUTROABS 8.9*  HGB 6.5*  HCT 22.5*  MCV 68.2*  PLT 505   Basic Metabolic Panel: Recent Labs  Lab 11/10/20 0931  NA 134*  K 4.9  CL 101  CO2 19*  GLUCOSE 113*  BUN 54*  CREATININE 2.95*  CALCIUM 8.8*   GFR: Estimated Creatinine Clearance: 20.6 mL/min (A) (by C-G formula based on SCr of 2.95 mg/dL (H)). Liver Function Tests: Recent Labs  Lab 11/10/20 1240  AST 412*  ALT 380*  ALKPHOS 59  BILITOT 0.9  PROT 6.4*  ALBUMIN 3.6   No results for input(s): LIPASE, AMYLASE in the last 168 hours. No results for input(s): AMMONIA in the last 168 hours. Coagulation Profile: No results for input(s): INR, PROTIME in the  last 168 hours. Cardiac Enzymes: No results for input(s): CKTOTAL, CKMB, CKMBINDEX, TROPONINI in the last 168 hours. BNP (last 3 results) No results for input(s): PROBNP in the last 8760 hours. HbA1C: No results for input(s): HGBA1C in the last 72 hours. CBG: No results for input(s): GLUCAP in the last 168 hours. Lipid Profile: No results for input(s): CHOL, HDL, LDLCALC, TRIG, CHOLHDL, LDLDIRECT in the last 72 hours. Thyroid Function Tests: No results for input(s): TSH, T4TOTAL, FREET4, T3FREE, THYROIDAB in the last 72 hours. Anemia Panel: No results for input(s): VITAMINB12, FOLATE, FERRITIN, TIBC, IRON, RETICCTPCT in the last 72 hours.  Urine analysis:    Component Value Date/Time   COLORURINE YELLOW 12/04/2013 0319   APPEARANCEUR CLOUDY (A) 12/04/2013 0319   LABSPEC 1.019 12/04/2013 0319   PHURINE 7.0 12/04/2013 0319   GLUCOSEU NEGATIVE 12/04/2013 0319   HGBUR MODERATE (A) 12/04/2013 0319   BILIRUBINUR NEGATIVE 12/04/2013 0319   KETONESUR NEGATIVE 12/04/2013 0319   PROTEINUR 30 (A) 12/04/2013 0319   UROBILINOGEN 0.2 12/04/2013 0319   NITRITE POSITIVE (A) 12/04/2013 0319   LEUKOCYTESUR LARGE (A) 12/04/2013 0319   Sepsis Labs: @LABRCNTIP (procalcitonin:4,lacticidven:4) ) Recent Results (from the past 240 hour(s))  Resp Panel by RT-PCR (Flu A&B, Covid) Nasopharyngeal Swab     Status: None   Collection Time: 11/10/20  9:24 AM   Specimen: Nasopharyngeal Swab; Nasopharyngeal(NP) swabs in vial transport medium  Result Value Ref Range Status   SARS Coronavirus 2 by RT PCR NEGATIVE NEGATIVE Final    Comment: (NOTE) SARS-CoV-2 target nucleic acids are NOT DETECTED.  The SARS-CoV-2 RNA is generally detectable in upper respiratory specimens during the acute phase of infection. The lowest concentration of SARS-CoV-2 viral copies this assay can detect is 138 copies/mL. A negative result does not preclude SARS-Cov-2 infection and should not be used as the sole basis for treatment  or other patient management decisions. A negative result may occur with  improper specimen collection/handling, submission of specimen other than nasopharyngeal swab, presence of viral mutation(s) within the areas targeted by this assay, and inadequate number of viral copies(<138 copies/mL). A negative result must be combined with clinical observations, patient history, and epidemiological information. The expected result is Negative.  Fact Sheet for Patients:  EntrepreneurPulse.com.au  Fact Sheet for Healthcare Providers:  IncredibleEmployment.be  This test is no t yet approved or cleared by the [place] FDA and  has been authorized for detection and/or diagnosis of SARS-CoV-2 by FDA under an Emergency Use Authorization (EUA). This EUA will remain  in effect (meaning this test can be used) for the duration of the COVID-19 declaration under Section 564(b)(1) of the Act, 21 U.S.C.section 360bbb-3(b)(1), unless the authorization is terminated  or revoked sooner.       Influenza A by PCR NEGATIVE NEGATIVE Final   Influenza B by PCR NEGATIVE NEGATIVE Final    Comment: (NOTE) The Xpert Xpress SARS-CoV-2/FLU/RSV plus assay is intended as an aid in the diagnosis of influenza from Nasopharyngeal swab specimens and should not be used as a sole basis for treatment. Nasal washings and aspirates are unacceptable for Xpert Xpress SARS-CoV-2/FLU/RSV testing.  Fact Sheet for Patients: EntrepreneurPulse.com.au  Fact Sheet for Healthcare Providers: IncredibleEmployment.be  This test is not yet approved or cleared by the [place] FDA and has been authorized for detection and/or diagnosis of SARS-CoV-2 by FDA under an Emergency Use Authorization (EUA). This EUA will remain in effect (meaning this test can be used) for the duration of the COVID-19 declaration under Section 564(b)(1) of the Act, 21 U.S.C. section  360bbb-3(b)(1), unless the authorization is terminated or revoked.  Performed at [facility], [place] [address], [place], [place] [address]      Radiological Exams on Admission: DG Chest 2 View  Result Date: 11/10/2020 CLINICAL DATA:  Shortness of breath, cough and congestion. EXAM: CHEST - 2 VIEW COMPARISON:  11/23/2010 FINDINGS: The cardiac silhouette, mediastinal hilar contours are within normal limits given the patient's age and AP projection. Stable surgical changes related to bypass surgery. Chronic appearing bronchitic type lung changes but could not exclude superimposed bronchitis. There is a suspected small  right pleural effusion with overlying atelectasis. No definite infiltrates. IMPRESSION: 1. Chronic appearing bronchitic type lung changes but could not exclude superimposed bronchitis. 2. Small right pleural effusion with overlying atelectasis. Electronically Signed   By: Marijo Sanes M.D.   On: 11/10/2020 10:28   US Abdomen Limited RUQ (LIVER/GB)  Result Date: 11/10/2020 CLINICAL DATA:  Elevated liver function test. EXAM: ULTRASOUND ABDOMEN LIMITED RIGHT UPPER QUADRANT COMPARISON:  None. FINDINGS: Gallbladder: Shadowing echogenic gallstones are seen within the gallbladder lumen. Echogenic sludge is also noted. The gallbladder wall measures 4.0 mm in thickness. No sonographic Murphy sign noted by sonographer. Common bile duct: Diameter: 3.4 mm Liver: No focal lesion identified. Within normal limits in parenchymal echogenicity. Portal vein is patent on color Doppler imaging with normal direction of blood flow towards the liver. Other: Of incidental note is the presence of a right-sided pleural effusion. IMPRESSION: Cholelithiasis and gallbladder sludge with gallbladder wall thickening. Acute cholecystitis cannot completely be excluded. Further evaluation with a nuclear medicine hepatobiliary scan is recommended. Electronically Signed   By: Virgina Norfolk M.D.   On: 11/10/2020  21:02    EKG: Independently reviewed.  Normal sinus rhythm with PVCs.  Assessment/Plan Active Problems:   CAD (coronary artery disease)   HTN (hypertension)   Symptomatic anemia   ARF (acute renal failure) (HCC)   Elevated LFTs   Hoarseness    Symptomatic anemia with prior history of GI bleed and peptic ulcer disease has had EGD in 2011.  Stool for occult blood was negative at this time.  Check anemia panel we will transfuse PRBC recheck hemoglobin.  Treatments have been ordered. Hoarseness of voice with patient coming up pain around the left side of the neck.  Will check CT scan.  Closely monitor for any stridor or worsening of breathing. Acute on chronically disease stage III creatinine is acutely worsened when compared to the one in March.  In March it was 1.6 now it is 2.9.  Check UA follow metabolic panel closely.  We will check renal ultrasound.  Recheck metabolic panel after transfusion.  We will hold lisinopril due to renal failure.  Patient denies taking any NSAIDs. Abnormal LFTs with sonogram of abdomen showing cholelithiasis and not ruling out cholecystitis.  Will check HIDA scan.  Abdomen appears benign on exam.  For now we will keep patient empirically on antibiotics since patient also has hoarseness of voice.  Also check Tylenol levels and acute hepatitis panel. CAD status post stenting and CABG patient symptoms are likely from symptomatic anemia.  However high sensitive troponins are slightly elevated.  Will trend cardiac markers. Hypertension on amlodipine and beta-blockers.  Holding lisinopril due to renal failure.   DVT prophylaxis: Heparin. Code Status: Full code. Family Communication: Patient's son. Disposition Plan: Home. Consults called: None. Admission status: Observation.   Rise Patience MD [facility] Pager [phone].  If 7PM-7AM, please contact night-coverage [email or web address] Password [email or web address]  11/10/2020, 9:30 PM

## 2020-11-10 NOTE — ED Notes (Signed)
Patients o2 saturation is 90%. RN aware, placed patient on 2l of O2.

## 2020-11-10 NOTE — ED Provider Notes (Signed)
Emergency Medicine Provider Triage Evaluation Note  Frederick Kirby , a 85 y.o. male  was evaluated in triage.  Pt complains of cough with some SOB for two days. Patient has hx of COPD. Has not been using breathing treatments. Denies chest pain. Endorses some green phlegm. No fever, chills, nausea, vomiting. SOB worse with exertion. Some neck soreness, no difficulty swallowing.  Review of Systems  Positive: As above Negative: As above  Physical Exam  There were no vitals taken for this visit. Gen:   Awake, no distress   Resp:  Normal effort, some rhonchi that clear with cough, no respiratory distress MSK:   Moves extremities without difficulty  Other:  TTP of anterior aspect of neck, esp on left, no masses palpated  Medical Decision Making  Medically screening exam initiated at 9:20 AM.  Appropriate orders placed.  IGNATZ DEIS was informed that the remainder of the evaluation will be completed by another provider, this initial triage assessment does not replace that evaluation, and the importance of remaining in the ED until their evaluation is complete.  Cough, SOB, raspy voice   Dorien Chihuahua 11/10/20 8069    Teressa Lower, MD 11/10/20 1818

## 2020-11-10 NOTE — ED Notes (Addendum)
Critical Hgb 6.5.  Dr. Tinnie Gens and charge RN notified.

## 2020-11-10 NOTE — ED Notes (Signed)
In US at this time.

## 2020-11-10 NOTE — ED Notes (Signed)
Rapid response nurse Lucky Cowboy at bedside at this time. And advises pt does need to go to a different a floor

## 2020-11-10 NOTE — ED Notes (Signed)
Spoke with Frederick Kirby from the floor advised they are concerned that the pt might need a higher level care due to heart rate and troponin levels. Spoke to charge nurse and was advised to call house supervisor or rapid response contacted house supervisor and was advised they would look into it and get back with me

## 2020-11-10 NOTE — ED Notes (Signed)
Attempted to call report unable to take report at this time.

## 2020-11-10 NOTE — ED Provider Notes (Signed)
[facility] EMERGENCY DEPARTMENT Provider Note   CSN: [id number] Arrival date & time: 11/10/20  2585     History Chief Complaint  Patient presents with   Cough    Frederick Kirby is a 85 y.o. male.   Cough Cough characteristics:  Productive Sputum characteristics:  Green and white Severity:  Severe Onset quality:  Gradual Duration:  1 week Timing:  Constant Progression:  Waxing and waning Chronicity:  New Relieved by:  Nothing Worsened by:  Nothing Ineffective treatments:  None tried Associated symptoms: chills, rhinorrhea, shortness of breath, sinus congestion and sore throat   Associated symptoms: no chest pain, no diaphoresis, no fever, no headaches, no rash and no wheezing       Past Medical History:  Diagnosis Date   COPD (chronic obstructive pulmonary disease) (HCC)    Coronary artery disease    Hyperlipidemia    Hypertension    Mild renal insufficiency    Myocardial infarction ([place])    S/P CABG (coronary artery bypass graft) 1994   Stroke [facility])     Patient Active Problem List   Diagnosis Date Noted   Carotid bruit 10/17/2014   CAD (coronary artery disease) 12/16/2012   CAD (coronary artery disease) of artery bypass graft 12/16/2012   HTN (hypertension) 12/16/2012   Renal insufficiency 12/16/2012   Hyperlipidemia with target LDL less than 70 12/16/2012   Iliac artery aneurysm ([place]) 12/16/2012    Past Surgical History:  Procedure Laterality Date   CAROTID DOPPLER  12/2011   Right Bulb/Prox ICA 50-69% diameter reduction; Left Bulb/Prox ICA 0-49% diameter reduction   CORONARY ANGIOPLASTY WITH STENT PLACEMENT  04/28/2003   L Cfx with 99% prox lesion in AV groove; patent SVG to ramus intermedius w/ 95% distal lesion; SVG to OM totally occluded; SVG to RCA totally occluded; native RCA occluded in prox region w/collaterals from LAD into PDA; Taxus 2.5x63mm stent to distal VG to ramus (Dr. Gerrie Nordmann)   CORONARY ARTERY BYPASS GRAFT  01/1992    LIMA-LAD & diagonal, SVG to diagonal, SVG to OM1 & OM2, VG to PDA & PLA (Dr. Redmond Pulling)   [place]  11/2011   lexiscan - normal study, EF 80% post-stress   RENAL DOPPLER  12/2011   Right CIA with aneursymal dilatation 2.3x3.1   TRANSTHORACIC ECHOCARDIOGRAM  10/2008   EF 50-55%, borderline conc LVH, mild posterior wall hypokinesis; mild mitral annular calcification, prolapse of anterior leaflets, mild MR; RVSP 30-53mmHg; mild TR; mild calcification of AV leaflets       Family History  Problem Relation Age of Onset   Cancer Mother     Social History   Tobacco Use   Smoking status: Former    Types: Cigarettes    Quit date: 01/13/2005    Years since quitting: 15.8   Smokeless tobacco: Never  Substance Use Topics   Alcohol use: No   Drug use: No    Home Medications Prior to Admission medications   Medication Sig Start Date End Date Taking? Authorizing Provider  amLODipine (NORVASC) 10 MG tablet Take 1 tablet (10 mg total) by mouth daily. 03/05/20   [name], MD  clopidogrel (PLAVIX) 75 MG tablet Take 1 tablet (75 mg total) by mouth daily. 03/05/20   [name], MD  FOLBIC 2.5-25-2 MG TABS tablet Take 1 tablet by mouth daily. 03/05/20   [name], MD  lisinopril (ZESTRIL) 2.5 MG tablet TAKE 1 TABLET BY MOUTH EVERY DAY 09/04/20  [name], MD  metoprolol succinate (TOPROL-XL) 25 MG 24 hr tablet Take 1 tablet (25 mg total) by mouth daily. 03/05/20   [name], MD  Omega-3 Fatty Acids (FISH OIL) 1200 MG CAPS Take 1 capsule (1,200 mg total) by mouth 2 (two) times daily. 03/05/20   [name], MD  pantoprazole (PROTONIX) 40 MG tablet Take 1 tablet (40 mg total) by mouth daily. 03/05/20   [name], MD  rosuvastatin (CRESTOR) 40 MG tablet Take 1 tablet (40 mg total) by mouth daily. 03/05/20   [name], MD    Allergies    Niaspan [niacin er]  Review of Systems   Review of Systems  Constitutional:  Positive for chills and  fatigue. Negative for diaphoresis and fever.  HENT:  Positive for congestion, rhinorrhea, sore throat and voice change (lost voice). Negative for trouble swallowing.   Eyes:  Negative for visual disturbance.  Respiratory:  Positive for cough, chest tightness and shortness of breath. Negative for wheezing.   Cardiovascular:  Positive for leg swelling (chronic). Negative for chest pain and palpitations.  Gastrointestinal:  Positive for constipation (chronic). Negative for abdominal pain, diarrhea, nausea and vomiting.  Genitourinary:  Negative for flank pain.  Musculoskeletal:  Negative for back pain, neck pain and neck stiffness.  Skin:  Negative for rash.  Neurological:  Negative for dizziness, light-headedness and headaches.  Psychiatric/Behavioral:  Negative for agitation.   All other systems reviewed and are negative.  Physical Exam Updated Vital Signs BP (!) 125/49 (BP Location: Right Arm)   Pulse (!) 46   Temp 98.1 F (36.7 C)   Resp 18   SpO2 90%   Physical Exam Vitals and nursing note reviewed.  Constitutional:      General: He is not in acute distress.    Appearance: He is well-developed. He is not ill-appearing, toxic-appearing or diaphoretic.  HENT:     Head: Normocephalic and atraumatic.     Nose: Congestion and rhinorrhea present.     Mouth/Throat:     Mouth: Mucous membranes are dry.     Pharynx: No oropharyngeal exudate or posterior oropharyngeal erythema.  Eyes:     Extraocular Movements: Extraocular movements intact.     Conjunctiva/sclera: Conjunctivae normal.     Pupils: Pupils are equal, round, and reactive to light.  Neck:     Vascular: No carotid bruit.  Cardiovascular:     Rate and Rhythm: Normal rate and regular rhythm.     Pulses: Normal pulses.     Heart sounds: No murmur heard. Pulmonary:     Effort: Pulmonary effort is normal. No respiratory distress.     Breath sounds: Normal breath sounds. No wheezing, rhonchi or rales.  Chest:     Chest  wall: No tenderness.  Abdominal:     General: Abdomen is flat. There is no distension.     Palpations: Abdomen is soft.     Tenderness: There is no abdominal tenderness. There is no guarding or rebound.  Musculoskeletal:        General: No tenderness.     Cervical back: Neck supple. No rigidity or tenderness.     Right lower leg: Edema present.     Left lower leg: Edema present.  Lymphadenopathy:     Cervical: No cervical adenopathy.  Skin:    General: Skin is warm and dry.     Capillary Refill: Capillary refill takes less than 2 seconds.     Coloration: Skin is pale.  Findings: No erythema.  Neurological:     General: No focal deficit present.     Mental Status: He is alert.     Sensory: No sensory deficit.     Motor: No weakness.  Psychiatric:        Mood and Affect: Mood normal.    ED Results / Procedures / Treatments   Labs (all labs ordered are listed, but only abnormal results are displayed) Labs Reviewed  CBC WITH DIFFERENTIAL/PLATELET - Abnormal; Notable for the following components:      Result Value   WBC 11.4 (*)    RBC 3.30 (*)    Hemoglobin 6.5 (*)    HCT 22.5 (*)    MCV 68.2 (*)    MCH 19.7 (*)    MCHC 28.9 (*)    RDW 18.8 (*)    nRBC 0.4 (*)    Neutro Abs 8.9 (*)    Monocytes Absolute 1.5 (*)    All other components within normal limits  BASIC METABOLIC PANEL - Abnormal; Notable for the following components:   Sodium 134 (*)    CO2 19 (*)    Glucose, Bld 113 (*)    BUN 54 (*)    Creatinine, Ser 2.95 (*)    Calcium 8.8 (*)    GFR, Estimated 20 (*)    All other components within normal limits  HEPATIC FUNCTION PANEL - Abnormal; Notable for the following components:   Total Protein 6.4 (*)    AST 412 (*)    ALT 380 (*)    All other components within normal limits  BRAIN NATRIURETIC PEPTIDE - Abnormal; Notable for the following components:   B Natriuretic Peptide 527.4 (*)    All other components within normal limits  TROPONIN I (HIGH  SENSITIVITY) - Abnormal; Notable for the following components:   Troponin I (High Sensitivity) 137 (*)    All other components within normal limits  TROPONIN I (HIGH SENSITIVITY) - Abnormal; Notable for the following components:   Troponin I (High Sensitivity) 152 (*)    All other components within normal limits  RESP PANEL BY RT-PCR (FLU A&B, COVID) ARPGX2  POC OCCULT BLOOD, ED  PREPARE RBC (CROSSMATCH)  TYPE AND SCREEN    EKG EKG Interpretation  Date/Time:  Saturday November 10 2020 09:26:59 EDT Ventricular Rate:  78 PR Interval:  208 QRS Duration: 86 QT Interval:  412 QTC Calculation: 469 R Axis:   48 Text Interpretation: Sinus rhythm with frequent Premature ventricular complexes Low voltage QRS ST & T wave abnormality, consider inferolateral ischemia Prolonged QT Abnormal ECG when compared to prior, new trigeminy. No STEMI Confirmed by Antony Blackbird [phone]) on 11/10/2020 11:54:54 AM  Radiology DG Chest 2 View  Result Date: 11/10/2020 CLINICAL DATA:  Shortness of breath, cough and congestion. EXAM: CHEST - 2 VIEW COMPARISON:  11/23/2010 FINDINGS: The cardiac silhouette, mediastinal hilar contours are within normal limits given the patient's age and AP projection. Stable surgical changes related to bypass surgery. Chronic appearing bronchitic type lung changes but could not exclude superimposed bronchitis. There is a suspected small right pleural effusion with overlying atelectasis. No definite infiltrates. IMPRESSION: 1. Chronic appearing bronchitic type lung changes but could not exclude superimposed bronchitis. 2. Small right pleural effusion with overlying atelectasis. Electronically Signed   By: Marijo Sanes M.D.   On: 11/10/2020 10:28    Procedures Procedures   CRITICAL CARE Performed by: Gwenyth Allegra Darlina Mccaughey Total critical care time: 35 minutes Critical care time was exclusive of  separately billable procedures and treating other patients. Critical care was necessary to  treat or prevent imminent or life-threatening deterioration. Critical care was time spent personally by me on the following activities: development of treatment plan with patient and/or surrogate as well as nursing, discussions with consultants, evaluation of patient's response to treatment, examination of patient, obtaining history from patient or surrogate, ordering and performing treatments and interventions, ordering and review of laboratory studies, ordering and review of radiographic studies, pulse oximetry and re-evaluation of patient's condition.   Medications Ordered in ED Medications  0.9 %  sodium chloride infusion (has no administration in time range)    ED Course  I have reviewed the triage vital signs and the nursing notes.  Pertinent labs & imaging results that were available during my care of the patient were reviewed by me and considered in my medical decision making (see chart for details).    MDM Rules/Calculators/A&P                           CARSTON RIEDL is a 85 y.o. male with a past medical history significant for CAD status post CABG, recent diagnosis of COPD, previous stroke, hypertension, hyperlipidemia, gastric ulcers with prior GI bleeding, and hypertension who presents with cough, exertional shortness of breath, fatigue, and loss of voice.  According to patient, he has had some URI symptoms with a productive cough for the last week and reports raspy voice for the last few days.  He reports having difficulty walking due to this and the congestion.  He denies any fevers but has had some chills.  He occasion report sore throat but is denying pain currently.  He denies any chest pain, palpitations, but does report the shortness of breath.  He denies any abdominal pain and reports chronic constipation.  He says on and off chronically he gets dark stools but has not had any over the last few days.  He denies any urinary changes or trauma.  On arrival, oxygen saturation was  90% and he was having increased work of breathing so he was placed on 2 L nasal cannula with improvement.  On my exam, lungs were clear and he did not have any stridor.  Oropharyngeal exam was unremarkable but he did have some congestion and drainage.  No tenderness on my neck exam.  Good pulses in extremities.  Legs are slightly edematous which she reports is chronic.  Abdomen is nontender.  Normal bowel sounds.  The patient is pale and family thinks he is more pale than normal. He was warm to the touch but afebrile.  Patient had work-up starting in triage including basic labs, chest x-ray, and COVID test.  COVID and flu were negative.  Chest x-ray showed chronic changes but no evidence of acute infiltrate.  His labs returned showing acute kidney injury as well as likely symptomatic anemia with a hemoglobin of 6.5 down from the nines previously.  I obtained a fecal occult which showed negative. He reports that he has been seen by Dr. Watt Climes with [name] in the past.  Given his exertional shortness of breath and likely symptomatic anemia, I do feel he needs a blood transfusion and admission.  Will discuss with admitting team if they feel he needs antibiotics given the productive cough, chills, white count, and x-ray without infiltrate.  Given his lack of stridor, neck tenderness, or description of pain in his throat currently, will hold on any throat imaging  at this time.  Fecal occult was negative.  Unclear source of Anemia this time.  When further labs are completed, patient will be called for admission.  Patient's LFTs were elevated but he is denying any nausea, vomiting, or abdominal pain on my exam.  We will get an ultrasound but I suspect this is all related to the anemia.  He has received the blood and is already starting to feel better.  He denies any chest pain or reports of shortness of breath starting to improve as well.  Still on oxygen.  Medicine will be called for admission for  symptomatic anemia with likely demand elevation in his troponin and BNP elevation with new shortness of breath and needing 2 L of oxygen to maintain oxygen saturations in the mid 90s.   Final Clinical Impression(s) / ED Diagnoses Final diagnoses:  Symptomatic anemia  Cough, unspecified type  LFT elevation     Clinical Impression: 1. Symptomatic anemia   2. Cough, unspecified type   3. LFT elevation     Disposition: Admit  This note was prepared with assistance of Dragon voice recognition software. Occasional wrong-word or sound-a-like substitutions may have occurred due to the inherent limitations of voice recognition software.     Burdette Gergely, Gwenyth Allegra, MD 11/10/20 [phone]

## 2020-11-10 NOTE — ED Notes (Signed)
Received verbal report from Bailey B RN at this time 

## 2020-11-10 NOTE — ED Triage Notes (Signed)
Pt to triage via [facility] from home.  Reports cough x 1 week that is sometimes productive with white phlegm.  Reports raspy voice x 2 days, congestion, and SOB with exertion.  Reports L sided throat/neck pain.  HR 38-60 per EMS.  Lung sounds clear.

## 2020-11-11 ENCOUNTER — Inpatient Hospital Stay (HOSPITAL_COMMUNITY): Payer: PPO

## 2020-11-11 ENCOUNTER — Encounter (HOSPITAL_COMMUNITY): Payer: Self-pay | Admitting: Internal Medicine

## 2020-11-11 ENCOUNTER — Observation Stay (HOSPITAL_COMMUNITY): Payer: PPO

## 2020-11-11 DIAGNOSIS — Z7902 Long term (current) use of antithrombotics/antiplatelets: Secondary | ICD-10-CM | POA: Diagnosis not present

## 2020-11-11 DIAGNOSIS — Z8673 Personal history of transient ischemic attack (TIA), and cerebral infarction without residual deficits: Secondary | ICD-10-CM | POA: Diagnosis not present

## 2020-11-11 DIAGNOSIS — I252 Old myocardial infarction: Secondary | ICD-10-CM | POA: Diagnosis not present

## 2020-11-11 DIAGNOSIS — I25810 Atherosclerosis of coronary artery bypass graft(s) without angina pectoris: Secondary | ICD-10-CM | POA: Diagnosis present

## 2020-11-11 DIAGNOSIS — M542 Cervicalgia: Secondary | ICD-10-CM | POA: Diagnosis not present

## 2020-11-11 DIAGNOSIS — J449 Chronic obstructive pulmonary disease, unspecified: Secondary | ICD-10-CM | POA: Diagnosis present

## 2020-11-11 DIAGNOSIS — Z6828 Body mass index (BMI) 28.0-28.9, adult: Secondary | ICD-10-CM | POA: Diagnosis not present

## 2020-11-11 DIAGNOSIS — D509 Iron deficiency anemia, unspecified: Secondary | ICD-10-CM | POA: Diagnosis present

## 2020-11-11 DIAGNOSIS — R7989 Other specified abnormal findings of blood chemistry: Secondary | ICD-10-CM | POA: Diagnosis not present

## 2020-11-11 DIAGNOSIS — Z79899 Other long term (current) drug therapy: Secondary | ICD-10-CM | POA: Diagnosis not present

## 2020-11-11 DIAGNOSIS — Z87891 Personal history of nicotine dependence: Secondary | ICD-10-CM | POA: Diagnosis not present

## 2020-11-11 DIAGNOSIS — E785 Hyperlipidemia, unspecified: Secondary | ICD-10-CM | POA: Diagnosis present

## 2020-11-11 DIAGNOSIS — R9431 Abnormal electrocardiogram [ECG] [EKG]: Secondary | ICD-10-CM | POA: Diagnosis not present

## 2020-11-11 DIAGNOSIS — Z8711 Personal history of peptic ulcer disease: Secondary | ICD-10-CM | POA: Diagnosis not present

## 2020-11-11 DIAGNOSIS — N402 Nodular prostate without lower urinary tract symptoms: Secondary | ICD-10-CM | POA: Diagnosis not present

## 2020-11-11 DIAGNOSIS — I2510 Atherosclerotic heart disease of native coronary artery without angina pectoris: Secondary | ICD-10-CM | POA: Diagnosis present

## 2020-11-11 DIAGNOSIS — R490 Dysphonia: Secondary | ICD-10-CM

## 2020-11-11 DIAGNOSIS — I700 Atherosclerosis of aorta: Secondary | ICD-10-CM | POA: Diagnosis not present

## 2020-11-11 DIAGNOSIS — K5909 Other constipation: Secondary | ICD-10-CM | POA: Diagnosis present

## 2020-11-11 DIAGNOSIS — D649 Anemia, unspecified: Secondary | ICD-10-CM | POA: Diagnosis not present

## 2020-11-11 DIAGNOSIS — I129 Hypertensive chronic kidney disease with stage 1 through stage 4 chronic kidney disease, or unspecified chronic kidney disease: Secondary | ICD-10-CM | POA: Diagnosis present

## 2020-11-11 DIAGNOSIS — Z955 Presence of coronary angioplasty implant and graft: Secondary | ICD-10-CM | POA: Diagnosis not present

## 2020-11-11 DIAGNOSIS — Z809 Family history of malignant neoplasm, unspecified: Secondary | ICD-10-CM | POA: Diagnosis not present

## 2020-11-11 DIAGNOSIS — Z8719 Personal history of other diseases of the digestive system: Secondary | ICD-10-CM | POA: Diagnosis not present

## 2020-11-11 DIAGNOSIS — I10 Essential (primary) hypertension: Secondary | ICD-10-CM | POA: Diagnosis not present

## 2020-11-11 DIAGNOSIS — N281 Cyst of kidney, acquired: Secondary | ICD-10-CM | POA: Diagnosis not present

## 2020-11-11 DIAGNOSIS — D62 Acute posthemorrhagic anemia: Secondary | ICD-10-CM | POA: Diagnosis present

## 2020-11-11 DIAGNOSIS — N179 Acute kidney failure, unspecified: Secondary | ICD-10-CM | POA: Diagnosis present

## 2020-11-11 DIAGNOSIS — R059 Cough, unspecified: Secondary | ICD-10-CM | POA: Diagnosis present

## 2020-11-11 DIAGNOSIS — R109 Unspecified abdominal pain: Secondary | ICD-10-CM | POA: Diagnosis not present

## 2020-11-11 DIAGNOSIS — Z20822 Contact with and (suspected) exposure to covid-19: Secondary | ICD-10-CM | POA: Diagnosis present

## 2020-11-11 DIAGNOSIS — N1832 Chronic kidney disease, stage 3b: Secondary | ICD-10-CM | POA: Diagnosis present

## 2020-11-11 DIAGNOSIS — Z888 Allergy status to other drugs, medicaments and biological substances status: Secondary | ICD-10-CM | POA: Diagnosis not present

## 2020-11-11 LAB — COMPREHENSIVE METABOLIC PANEL
ALT: 476 U/L — ABNORMAL HIGH (ref 0–44)
AST: 400 U/L — ABNORMAL HIGH (ref 15–41)
Albumin: 3.5 g/dL (ref 3.5–5.0)
Alkaline Phosphatase: 66 U/L (ref 38–126)
Anion gap: 8 (ref 5–15)
BUN: 54 mg/dL — ABNORMAL HIGH (ref 8–23)
CO2: 22 mmol/L (ref 22–32)
Calcium: 8.6 mg/dL — ABNORMAL LOW (ref 8.9–10.3)
Chloride: 105 mmol/L (ref 98–111)
Creatinine, Ser: 2.22 mg/dL — ABNORMAL HIGH (ref 0.61–1.24)
GFR, Estimated: 29 mL/min — ABNORMAL LOW (ref >60)
Glucose, Bld: 101 mg/dL — ABNORMAL HIGH (ref 70–99)
Potassium: 4.6 mmol/L (ref 3.5–5.1)
Sodium: 135 mmol/L (ref 135–145)
Total Bilirubin: 1.3 mg/dL — ABNORMAL HIGH (ref 0.3–1.2)
Total Protein: 6.5 g/dL (ref 6.5–8.1)

## 2020-11-11 LAB — RESPIRATORY PANEL BY PCR

## 2020-11-11 LAB — CBC
HCT: 26.1 % — ABNORMAL LOW (ref 39.0–52.0)
HCT: 30.1 % — ABNORMAL LOW (ref 39.0–52.0)
Hemoglobin: 7.8 g/dL — ABNORMAL LOW (ref 13.0–17.0)
Hemoglobin: 9.4 g/dL — ABNORMAL LOW (ref 13.0–17.0)
MCH: 20.8 pg — ABNORMAL LOW (ref 26.0–34.0)
MCH: 22.5 pg — ABNORMAL LOW (ref 26.0–34.0)
MCHC: 29.9 g/dL — ABNORMAL LOW (ref 30.0–36.0)
MCHC: 31.2 g/dL (ref 30.0–36.0)
MCV: 69.6 fL — ABNORMAL LOW (ref 80.0–100.0)
MCV: 72.2 fL — ABNORMAL LOW (ref 80.0–100.0)
Platelets: 233 10*3/uL (ref 150–400)
Platelets: 236 10*3/uL (ref 150–400)
RBC: 3.75 MIL/uL — ABNORMAL LOW (ref 4.22–5.81)
RBC: 4.17 MIL/uL — ABNORMAL LOW (ref 4.22–5.81)
RDW: 19.4 % — ABNORMAL HIGH (ref 11.5–15.5)
RDW: 21.9 % — ABNORMAL HIGH (ref 11.5–15.5)
WBC: 10.6 10*3/uL — ABNORMAL HIGH (ref 4.0–10.5)
WBC: 10.3 10*3/uL (ref 4.0–10.5)
nRBC: 0.5 % — ABNORMAL HIGH (ref 0.0–0.2)
nRBC: 0.4 % — ABNORMAL HIGH (ref 0.0–0.2)

## 2020-11-11 LAB — BASIC METABOLIC PANEL
Anion gap: 13 (ref 5–15)
BUN: 58 mg/dL — ABNORMAL HIGH (ref 8–23)
CO2: 21 mmol/L — ABNORMAL LOW (ref 22–32)
Calcium: 8.8 mg/dL — ABNORMAL LOW (ref 8.9–10.3)
Chloride: 101 mmol/L (ref 98–111)
Creatinine, Ser: 2.59 mg/dL — ABNORMAL HIGH (ref 0.61–1.24)
GFR, Estimated: 24 mL/min — ABNORMAL LOW (ref >60)
Glucose, Bld: 96 mg/dL (ref 70–99)
Potassium: 4.6 mmol/L (ref 3.5–5.1)
Sodium: 135 mmol/L (ref 135–145)

## 2020-11-11 LAB — FERRITIN: Ferritin: 8 ng/mL — ABNORMAL LOW (ref 24–336)

## 2020-11-11 LAB — IRON AND TIBC
Iron: 37 ug/dL — ABNORMAL LOW (ref 45–182)
Saturation Ratios: 7 % — ABNORMAL LOW (ref 17.9–39.5)
TIBC: 525 ug/dL — ABNORMAL HIGH (ref 250–450)
UIBC: 488 ug/dL

## 2020-11-11 LAB — RETICULOCYTES
Immature Retic Fract: 35.4 % — ABNORMAL HIGH (ref 2.3–15.9)
RBC.: 3.55 MIL/uL — ABNORMAL LOW (ref 4.22–5.81)
Retic Count, Absolute: 51.5 10*3/uL (ref 19.0–186.0)
Retic Ct Pct: 1.5 % (ref 0.4–3.1)

## 2020-11-11 LAB — VITAMIN B12: Vitamin B-12: 3505 pg/mL — ABNORMAL HIGH (ref 180–914)

## 2020-11-11 LAB — HEPATITIS PANEL, ACUTE
HCV Ab: NONREACTIVE
Hep A IgM: NONREACTIVE
Hep B C IgM: NONREACTIVE
Hepatitis B Surface Ag: NONREACTIVE

## 2020-11-11 LAB — GROUP A STREP BY PCR: Group A Strep by PCR: NOT DETECTED

## 2020-11-11 LAB — TROPONIN I (HIGH SENSITIVITY)
Troponin I (High Sensitivity): 141 ng/L (ref <18)
Troponin I (High Sensitivity): 137 ng/L (ref <18)

## 2020-11-11 LAB — PROTIME-INR
INR: 1.2 (ref 0.8–1.2)
Prothrombin Time: 15.6 seconds — ABNORMAL HIGH (ref 11.4–15.2)

## 2020-11-11 LAB — FOLATE: Folate: 92.4 ng/mL (ref >5.9)

## 2020-11-11 LAB — ACETAMINOPHEN LEVEL: Acetaminophen (Tylenol), Serum: <10 ug/mL — ABNORMAL LOW (ref 10–30)

## 2020-11-11 LAB — PREPARE RBC (CROSSMATCH)

## 2020-11-11 MED ORDER — PANTOPRAZOLE SODIUM 40 MG PO TBEC
40.0000 mg | DELAYED_RELEASE_TABLET | Freq: Two times a day (BID) | ORAL | Status: DC
  Administered 2020-11-11 – 2020-11-14 (×6): 40 mg via ORAL
  Filled 2020-11-11 (×6): qty 1

## 2020-11-11 MED ORDER — SODIUM CHLORIDE 0.9 % IV SOLN
500.0000 mg | INTRAVENOUS | Status: DC
  Filled 2020-11-11: qty 500

## 2020-11-11 MED ORDER — SODIUM CHLORIDE 0.9 % IV SOLN
2.0000 g | INTRAVENOUS | Status: DC
  Filled 2020-11-11: qty 20

## 2020-11-11 MED ORDER — ONDANSETRON HCL 4 MG/2ML IJ SOLN
4.0000 mg | Freq: Four times a day (QID) | INTRAMUSCULAR | Status: DC | PRN
Start: 2020-11-11 — End: 2020-11-14

## 2020-11-11 MED ORDER — ALBUTEROL SULFATE (2.5 MG/3ML) 0.083% IN NEBU
2.5000 mg | INHALATION_SOLUTION | RESPIRATORY_TRACT | Status: DC | PRN
  Administered 2020-11-12: 2.5 mg via RESPIRATORY_TRACT
  Filled 2020-11-11 (×2): qty 3

## 2020-11-11 NOTE — Progress Notes (Signed)
Patient transferred to CT in bed by transporter at 750 and returned at 815. Monitored on telemetry during transfers and O2 changed to portable for transfer and returned to bedside on return to unit.

## 2020-11-11 NOTE — Consult Note (Signed)
Reason for Consult: Iron deficiency and patient on Plavix and elevated liver test and gallstones Referring Physician: Hospital team  Frederick Kirby is an 85 y.o. male.  HPI: Patient familiar to me from over 10 years ago when he had a endoscopy and colonoscopy which showed a small ulcer and a small polyp and he was sent a letter 5 years ago for repeat colonoscopy but did not respond and other than chronic constipation he denies any GI issues and his case discussed with his wife as well and he has not seen any blood in his bowels and his main problems are his chronic cough and his hoarseness and he has had some decreased energy and his family history is negative from a GI standpoint and he has no other complaints and he denies having any over-the-counter medicine like aspirin or nonsteroidals or arthritis pills  Past Medical History:  Diagnosis Date   COPD (chronic obstructive pulmonary disease) ([place])    Coronary artery disease    Hyperlipidemia    Hypertension    Mild renal insufficiency    Myocardial infarction ([place])    S/P CABG (coronary artery bypass graft) 1994   Stroke [facility])     Past Surgical History:  Procedure Laterality Date   CAROTID DOPPLER  12/2011   Right Bulb/Prox ICA 50-69% diameter reduction; Left Bulb/Prox ICA 0-49% diameter reduction   CORONARY ANGIOPLASTY WITH STENT PLACEMENT  04/28/2003   L Cfx with 99% prox lesion in AV groove; patent SVG to ramus intermedius w/ 95% distal lesion; SVG to OM totally occluded; SVG to RCA totally occluded; native RCA occluded in prox region w/collaterals from LAD into PDA; Taxus 2.5x79mm stent to distal VG to ramus (Dr. Gerrie Nordmann)   CORONARY ARTERY BYPASS GRAFT  01/1992   LIMA-LAD & diagonal, SVG to diagonal, SVG to OM1 & OM2, VG to PDA & PLA (Dr. Redmond Pulling)   [place]  11/2011   lexiscan - normal study, EF 80% post-stress   RENAL DOPPLER  12/2011   Right CIA with aneursymal dilatation 2.3x3.1   TRANSTHORACIC ECHOCARDIOGRAM   10/2008   EF 50-55%, borderline conc LVH, mild posterior wall hypokinesis; mild mitral annular calcification, prolapse of anterior leaflets, mild MR; RVSP 30-32mmHg; mild TR; mild calcification of AV leaflets    Family History  Problem Relation Age of Onset   Cancer Mother     Social History:  reports that he quit smoking about 15 years ago. He has never used smokeless tobacco. He reports that he does not drink alcohol and does not use drugs.  Allergies:  Allergies  Allergen Reactions   Niaspan [Niacin Er]     Turned red & his skin burned    Medications: I have reviewed the patient's current medications.  Results for orders placed or performed during the hospital encounter of 11/10/20 (from the past 48 hour(s))  Resp Panel by RT-PCR (Flu A&B, Covid) Nasopharyngeal Swab     Status: None   Collection Time: 11/10/20  9:24 AM   Specimen: Nasopharyngeal Swab; Nasopharyngeal(NP) swabs in vial transport medium  Result Value Ref Range   SARS Coronavirus 2 by RT PCR NEGATIVE NEGATIVE    Comment: (NOTE) SARS-CoV-2 target nucleic acids are NOT DETECTED.  The SARS-CoV-2 RNA is generally detectable in upper respiratory specimens during the acute phase of infection. The lowest concentration of SARS-CoV-2 viral copies this assay can detect is 138 copies/mL. A negative result does not preclude SARS-Cov-2 infection and should not be used as  the sole basis for treatment or other patient management decisions. A negative result may occur with  improper specimen collection/handling, submission of specimen other than nasopharyngeal swab, presence of viral mutation(s) within the areas targeted by this assay, and inadequate number of viral copies(<138 copies/mL). A negative result must be combined with clinical observations, patient history, and epidemiological information. The expected result is Negative.  Fact Sheet for Patients:  EntrepreneurPulse.com.au  Fact Sheet for  Healthcare Providers:  IncredibleEmployment.be  This test is no t yet approved or cleared by the [place] FDA and  has been authorized for detection and/or diagnosis of SARS-CoV-2 by FDA under an Emergency Use Authorization (EUA). This EUA will remain  in effect (meaning this test can be used) for the duration of the COVID-19 declaration under Section 564(b)(1) of the Act, 21 U.S.C.section 360bbb-3(b)(1), unless the authorization is terminated  or revoked sooner.       Influenza A by PCR NEGATIVE NEGATIVE   Influenza B by PCR NEGATIVE NEGATIVE    Comment: (NOTE) The Xpert Xpress SARS-CoV-2/FLU/RSV plus assay is intended as an aid in the diagnosis of influenza from Nasopharyngeal swab specimens and should not be used as a sole basis for treatment. Nasal washings and aspirates are unacceptable for Xpert Xpress SARS-CoV-2/FLU/RSV testing.  Fact Sheet for Patients: EntrepreneurPulse.com.au  Fact Sheet for Healthcare Providers: IncredibleEmployment.be  This test is not yet approved or cleared by the [place] FDA and has been authorized for detection and/or diagnosis of SARS-CoV-2 by FDA under an Emergency Use Authorization (EUA). This EUA will remain in effect (meaning this test can be used) for the duration of the COVID-19 declaration under Section 564(b)(1) of the Act, 21 U.S.C. section 360bbb-3(b)(1), unless the authorization is terminated or revoked.  Performed at [facility], [place] [address], [place], [place] [address]   CBC with Differential     Status: Abnormal   Collection Time: 11/10/20  9:31 AM  Result Value Ref Range   WBC 11.4 (H) 4.0 - 10.5 K/uL   RBC 3.30 (L) 4.22 - 5.81 MIL/uL   Hemoglobin 6.5 (LL) 13.0 - 17.0 g/dL    Comment: REPEATED TO VERIFY Reticulocyte Hemoglobin testing may be clinically indicated, consider ordering this additional test [id number] THIS CRITICAL RESULT HAS VERIFIED AND  BEEN CALLED TO K NEWMAN RN BY BILLEE WYLIE ON 10 29 2022 AT 4709, AND HAS BEEN READ BACK.     HCT 22.5 (L) 39.0 - 52.0 %   MCV 68.2 (L) 80.0 - 100.0 fL    Comment: REPEATED TO VERIFY   MCH 19.7 (L) 26.0 - 34.0 pg   MCHC 28.9 (L) 30.0 - 36.0 g/dL   RDW 18.8 (H) 11.5 - 15.5 %   Platelets 346 150 - 400 K/uL   nRBC 0.4 (H) 0.0 - 0.2 %   Neutrophils Relative % 79 %   Neutro Abs 8.9 (H) 1.7 - 7.7 K/uL   Lymphocytes Relative 8 %   Lymphs Abs 0.9 0.7 - 4.0 K/uL   Monocytes Relative 13 %   Monocytes Absolute 1.5 (H) 0.1 - 1.0 K/uL   Eosinophils Relative 0 %   Eosinophils Absolute 0.0 0.0 - 0.5 K/uL   Basophils Relative 0 %   Basophils Absolute 0.0 0.0 - 0.1 K/uL   Immature Granulocytes 0 %   Abs Immature Granulocytes 0.04 0.00 - 0.07 K/uL    Comment: Performed at [facility], [address], [place], [place] [address]  Basic metabolic panel  Status: Abnormal   Collection Time: 11/10/20  9:31 AM  Result Value Ref Range   Sodium 134 (L) 135 - 145 mmol/L   Potassium 4.9 3.5 - 5.1 mmol/L   Chloride 101 98 - 111 mmol/L   CO2 19 (L) 22 - 32 mmol/L   Glucose, Bld 113 (H) 70 - 99 mg/dL    Comment: Glucose reference range applies only to samples taken after fasting for at least 8 hours.   BUN 54 (H) 8 - 23 mg/dL   Creatinine, Ser 2.95 (H) 0.61 - 1.24 mg/dL   Calcium 8.8 (L) 8.9 - 10.3 mg/dL   GFR, Estimated 20 (L) >60 mL/min    Comment: (NOTE) Calculated using the CKD-EPI Creatinine Equation (2021)    Anion gap 14 5 - 15    Comment: Performed at [place] [address] [place], [place] [address]  Prepare RBC (crossmatch)     Status: None   Collection Time: 11/10/20 12:39 PM  Result Value Ref Range   Order Confirmation      ORDER PROCESSED BY BLOOD BANK Performed at [facility], [place] [address] [place], [place] [address]   Hepatic function panel     Status: Abnormal   Collection Time: 11/10/20 12:40 PM  Result Value Ref Range   Total Protein 6.4 (L) 6.5 -  8.1 g/dL   Albumin 3.6 3.5 - 5.0 g/dL   AST 412 (H) 15 - 41 U/L   ALT 380 (H) 0 - 44 U/L   Alkaline Phosphatase 59 38 - 126 U/L   Total Bilirubin 0.9 0.3 - 1.2 mg/dL   Bilirubin, Direct 0.1 0.0 - 0.2 mg/dL   Indirect Bilirubin 0.8 0.3 - 0.9 mg/dL    Comment: Performed at [facility] [address] [place], [place] [address]  Troponin I (High Sensitivity)     Status: Abnormal   Collection Time: 11/10/20 12:40 PM  Result Value Ref Range   Troponin I (High Sensitivity) 137 (HH) <18 ng/L    Comment: CRITICAL RESULT CALLED TO, READ BACK BY AND VERIFIED WITH: Annye Asa RN BY SSTEPHENS [id number] (NOTE) Elevated high sensitivity troponin I (hsTnI) values and significant  changes across serial measurements may suggest ACS but many other  chronic and acute conditions are known to elevate hsTnI results.  Refer to the Links section for chest pain algorithms and additional  guidance. Performed at [facility], [place] [address] [place], [place] [address]   Brain natriuretic peptide     Status: Abnormal   Collection Time: 11/10/20 12:41 PM  Result Value Ref Range   B Natriuretic Peptide 527.4 (H) 0.0 - 100.0 pg/mL    Comment: Performed at [place] [address], [place],  [address]  POC occult blood, ED     Status: None   Collection Time: 11/10/20 12:42 PM  Result Value Ref Range   Fecal Occult Bld NEGATIVE NEGATIVE  Type and screen [place]     Status: None (Preliminary result)   Collection Time: 11/10/20 12:53 PM  Result Value Ref Range   ABO/RH(D) O NEG    Antibody Screen NEG    Sample Expiration 11/13/2020,2359    Unit Number [id number]    Blood Component Type RED CELLS,LR    Unit division 00    Status of Unit ISSUED    Transfusion Status OK TO TRANSFUSE    Crossmatch Result Compatible    Unit Number [id number]  Blood Component Type RED CELLS,LR    Unit division 00    Status of Unit ISSUED    Transfusion Status OK TO  TRANSFUSE    Crossmatch Result      Compatible Performed at [facility], [place] [address] [place], [place] [address]   Troponin I (High Sensitivity)     Status: Abnormal   Collection Time: 11/10/20  2:40 PM  Result Value Ref Range   Troponin I (High Sensitivity) 152 (HH) <18 ng/L    Comment: CRITICAL VALUE NOTED.  VALUE IS CONSISTENT WITH PREVIOUSLY REPORTED AND CALLED VALUE. (NOTE) Elevated high sensitivity troponin I (hsTnI) values and significant  changes across serial measurements may suggest ACS but many other  chronic and acute conditions are known to elevate hsTnI results.  Refer to the Links section for chest pain algorithms and additional  guidance. Performed at [facility], [place] [address], [place], [place] [address]   CBC     Status: Abnormal   Collection Time: 11/10/20  9:29 PM  Result Value Ref Range   WBC 9.9 4.0 - 10.5 K/uL   RBC 3.56 (L) 4.22 - 5.81 MIL/uL   Hemoglobin 7.2 (L) 13.0 - 17.0 g/dL    Comment: Reticulocyte Hemoglobin testing may be clinically indicated, consider ordering this additional test [id number]    HCT 24.8 (L) 39.0 - 52.0 %   MCV 69.7 (L) 80.0 - 100.0 fL   MCH 20.2 (L) 26.0 - 34.0 pg   MCHC 29.0 (L) 30.0 - 36.0 g/dL   RDW 19.6 (H) 11.5 - 15.5 %   Platelets 251 150 - 400 K/uL    Comment: REPEATED TO VERIFY   nRBC 0.3 (H) 0.0 - 0.2 %    Comment: Performed at [facility], [place] [address] [place], [place] [address]  Creatinine, serum     Status: Abnormal   Collection Time: 11/10/20  9:29 PM  Result Value Ref Range   Creatinine, Ser 2.66 (H) 0.61 - 1.24 mg/dL   GFR, Estimated 23 (L) >60 mL/min    Comment: (NOTE) Calculated using the CKD-EPI Creatinine Equation (2021) Performed at [facility] [address], [place], [place] [address]   Prepare RBC (crossmatch)     Status: None   Collection Time: 11/11/20 12:00 AM  Result Value Ref Range   Order Confirmation      ORDER PROCESSED BY BLOOD BANK Performed at  [facility], [place] [address] [place], [place] [address]   Basic metabolic panel     Status: Abnormal   Collection Time: 11/11/20  2:19 AM  Result Value Ref Range   Sodium 135 135 - 145 mmol/L   Potassium 4.6 3.5 - 5.1 mmol/L   Chloride 101 98 - 111 mmol/L   CO2 21 (L) 22 - 32 mmol/L   Glucose, Bld 96 70 - 99 mg/dL    Comment: Glucose reference range applies only to samples taken after fasting for at least 8 hours.   BUN 58 (H) 8 - 23 mg/dL   Creatinine, Ser 2.59 (H) 0.61 - 1.24 mg/dL   Calcium 8.8 (L) 8.9 - 10.3 mg/dL   GFR, Estimated 24 (L) >60 mL/min    Comment: (NOTE) Calculated using the CKD-EPI Creatinine Equation (2021)    Anion gap 13 5 - 15    Comment: Performed at [facility] [address] [place], [place] [address]  CBC     Status: Abnormal   Collection  Time: 11/11/20  2:19 AM  Result Value Ref Range   WBC 10.6 (H) 4.0 - 10.5 K/uL   RBC 3.75 (L) 4.22 - 5.81 MIL/uL   Hemoglobin 7.8 (L) 13.0 - 17.0 g/dL    Comment: Reticulocyte Hemoglobin testing may be clinically indicated, consider ordering this additional test [id number]    HCT 26.1 (L) 39.0 - 52.0 %   MCV 69.6 (L) 80.0 - 100.0 fL   MCH 20.8 (L) 26.0 - 34.0 pg   MCHC 29.9 (L) 30.0 - 36.0 g/dL   RDW 19.4 (H) 11.5 - 15.5 %   Platelets 233 150 - 400 K/uL    Comment: REPEATED TO VERIFY   nRBC 0.5 (H) 0.0 - 0.2 %    Comment: Performed at [facility] [address], [place], [place] [address]  Troponin I (High Sensitivity)     Status: Abnormal   Collection Time: 11/11/20  2:19 AM  Result Value Ref Range   Troponin I (High Sensitivity) 141 (HH) <18 ng/L    Comment: CRITICAL VALUE NOTED.  VALUE IS CONSISTENT WITH PREVIOUSLY REPORTED AND CALLED VALUE. (NOTE) Elevated high sensitivity troponin I (hsTnI) values and significant  changes across serial measurements may suggest ACS but many other  chronic and acute conditions are known to elevate hsTnI results.  Refer to the Links section for chest  pain algorithms and additional  guidance. Performed at [facility], [place] [address], [place], [place] [address]   Hepatitis panel, acute     Status: None   Collection Time: 11/11/20  4:03 AM  Result Value Ref Range   Hepatitis B Surface Ag NON REACTIVE NON REACTIVE   HCV Ab NON REACTIVE NON REACTIVE    Comment: (NOTE) Nonreactive HCV antibody screen is consistent with no HCV infections,  unless recent infection is suspected or other evidence exists to indicate HCV infection.     Hep A IgM NON REACTIVE NON REACTIVE   Hep B C IgM NON REACTIVE NON REACTIVE    Comment: Performed at [facility], [place] [address], [place], [place] [address]  Vitamin B12     Status: Abnormal   Collection Time: 11/11/20  4:03 AM  Result Value Ref Range   Vitamin B-12 3,505 (H) 180 - 914 pg/mL    Comment: (NOTE) This assay is not validated for testing neonatal or myeloproliferative syndrome specimens for Vitamin B12 levels. Performed at [facility], [place] [address], [place], [place] [address]   Folate     Status: None   Collection Time: 11/11/20  4:03 AM  Result Value Ref Range   Folate 92.4 >5.9 ng/mL    Comment: RESULTS CONFIRMED BY MANUAL DILUTION Performed at [facility], [place] [address] [place], [place] [address]   Iron and TIBC     Status: Abnormal   Collection Time: 11/11/20  4:03 AM  Result Value Ref Range   Iron 37 (L) 45 - 182 ug/dL   TIBC 525 (H) 250 - 450 ug/dL   Saturation Ratios 7 (L) 17.9 - 39.5 %   UIBC 488 ug/dL    Comment: Performed at [facility], [place] [address] [place], [place] [address]  Ferritin     Status: Abnormal   Collection Time: 11/11/20  4:03 AM  Result Value Ref Range   Ferritin 8 (L) 24 - 336 ng/mL    Comment: Performed at [facility], [place] [address], [place], [place] [address]  Reticulocytes  Status: Abnormal   Collection Time: 11/11/20  4:03 AM  Result Value Ref Range   Retic Ct Pct 1.5 0.4 - 3.1 %   RBC. 3.55 (L)  4.22 - 5.81 MIL/uL   Retic Count, Absolute 51.5 19.0 - 186.0 K/uL   Immature Retic Fract 35.4 (H) 2.3 - 15.9 %    Comment: Performed at [facility] [address] [place], [place] [address]  Acetaminophen level     Status: Abnormal   Collection Time: 11/11/20  4:03 AM  Result Value Ref Range   Acetaminophen (Tylenol), Serum <10 (L) 10 - 30 ug/mL    Comment: (NOTE) Therapeutic concentrations vary significantly. A range of 10-30 ug/mL  may be an effective concentration for many patients. However, some  are best treated at concentrations outside of this range. Acetaminophen concentrations >150 ug/mL at 4 hours after ingestion  and >50 ug/mL at 12 hours after ingestion are often associated with  toxic reactions.  Performed at [facility], [place] [address] [place], [place] [address]   Protime-INR     Status: Abnormal   Collection Time: 11/11/20  4:03 AM  Result Value Ref Range   Prothrombin Time 15.6 (H) 11.4 - 15.2 seconds   INR 1.2 0.8 - 1.2    Comment: (NOTE) INR goal varies based on device and disease states. Performed at [facility], [place] [address], [place], [place] [address]   Troponin I (High Sensitivity)     Status: Abnormal   Collection Time: 11/11/20  4:03 AM  Result Value Ref Range   Troponin I (High Sensitivity) 137 (HH) <18 ng/L    Comment: CRITICAL VALUE NOTED.  VALUE IS CONSISTENT WITH PREVIOUSLY REPORTED AND CALLED VALUE. (NOTE) Elevated high sensitivity troponin I (hsTnI) values and significant  changes across serial measurements may suggest ACS but many other  chronic and acute conditions are known to elevate hsTnI results.  Refer to the Links section for chest pain algorithms and additional  guidance. Performed at [facility], [place] [address] [place], [place] [address]   CBC     Status: Abnormal   Collection Time: 11/11/20  9:18 AM  Result Value Ref Range   WBC 10.3 4.0 - 10.5 K/uL   RBC 4.17 (L) 4.22 - 5.81 MIL/uL    Hemoglobin 9.4 (L) 13.0 - 17.0 g/dL   HCT 30.1 (L) 39.0 - 52.0 %   MCV 72.2 (L) 80.0 - 100.0 fL   MCH 22.5 (L) 26.0 - 34.0 pg   MCHC 31.2 30.0 - 36.0 g/dL   RDW 21.9 (H) 11.5 - 15.5 %   Platelets 236 150 - 400 K/uL    Comment: REPEATED TO VERIFY   nRBC 0.4 (H) 0.0 - 0.2 %    Comment: Performed at [facility], [place] [address] [place], [place] [address]  Comprehensive metabolic panel     Status: Abnormal   Collection Time: 11/11/20  9:18 AM  Result Value Ref Range   Sodium 135 135 - 145 mmol/L   Potassium 4.6 3.5 - 5.1 mmol/L   Chloride 105 98 - 111 mmol/L   CO2 22 22 - 32 mmol/L   Glucose, Bld 101 (H) 70 - 99 mg/dL    Comment: Glucose reference range applies only to samples taken after fasting for at least 8 hours.   BUN 54 (H) 8 - 23 mg/dL   Creatinine, Ser 2.22 (H) 0.61 - 1.24 mg/dL   Calcium 8.6 (L) 8.9 -  10.3 mg/dL   Total Protein 6.5 6.5 - 8.1 g/dL   Albumin 3.5 3.5 - 5.0 g/dL   AST 400 (H) 15 - 41 U/L   ALT 476 (H) 0 - 44 U/L   Alkaline Phosphatase 66 38 - 126 U/L   Total Bilirubin 1.3 (H) 0.3 - 1.2 mg/dL   GFR, Estimated 29 (L) >60 mL/min    Comment: (NOTE) Calculated using the CKD-EPI Creatinine Equation (2021)    Anion gap 8 5 - 15    Comment: Performed at [facility] [address], [place], [place] [address]    CT ABDOMEN PELVIS WO CONTRAST  Result Date: 11/11/2020 CLINICAL DATA:  Epigastric pain. EXAM: CT ABDOMEN AND PELVIS WITHOUT CONTRAST TECHNIQUE: Multidetector CT imaging of the abdomen and pelvis was performed following the standard protocol without IV contrast. COMPARISON:  Abdominal ultrasound 11/10/2020 FINDINGS: Lower chest: Bilateral moderate pleural effusions with passive atelectasis of the lower lobes. Hepatobiliary: No focal hepatic lesion. No biliary duct dilatation. Common bile duct is normal. Pancreas: Pancreas is normal. No ductal dilatation. No pancreatic inflammation. Spleen: Normal spleen Adrenals/urinary tract: Adrenal glands  normal. Renal cortical thinning on the LEFT. Simple cyst in the upper pole of the LEFT kidney. Ureters and bladder normal. Stomach/Bowel: Stomach, small-bowel and cecum are normal. The appendix is not identified but there is no pericecal inflammation to suggest appendicitis. Multiple diverticula of the descending colon and sigmoid colon without acute inflammation. Vascular/Lymphatic: Abdominal aorta is normal caliber with atherosclerotic calcification. There is no retroperitoneal or periportal lymphadenopathy. No pelvic lymphadenopathy. Reproductive: Nodular prostate gland. Other: No free fluid. Musculoskeletal: Benign-appearing sclerotic lesion at L4. Aggressive osseous lesion. IMPRESSION: 1. Bilateral pleural effusions with passive atelectasis. 2. No acute findings in the abdomen pelvis. 3.  Aortic Atherosclerosis (ICD10-I70.0). Electronically Signed   By: Suzy Bouchard M.D.   On: 11/11/2020 08:52   DG Chest 2 View  Result Date: 11/10/2020 CLINICAL DATA:  Shortness of breath, cough and congestion. EXAM: CHEST - 2 VIEW COMPARISON:  11/23/2010 FINDINGS: The cardiac silhouette, mediastinal hilar contours are within normal limits given the patient's age and AP projection. Stable surgical changes related to bypass surgery. Chronic appearing bronchitic type lung changes but could not exclude superimposed bronchitis. There is a suspected small right pleural effusion with overlying atelectasis. No definite infiltrates. IMPRESSION: 1. Chronic appearing bronchitic type lung changes but could not exclude superimposed bronchitis. 2. Small right pleural effusion with overlying atelectasis. Electronically Signed   By: Marijo Sanes M.D.   On: 11/10/2020 10:28   CT SOFT TISSUE NECK WO CONTRAST  Result Date: 11/11/2020 CLINICAL DATA:  Neck pain. EXAM: CT NECK WITHOUT CONTRAST TECHNIQUE: Multidetector CT imaging of the neck was performed following the standard protocol without intravenous contrast. COMPARISON:  None.  FINDINGS: Pharynx and larynx: No evidence of mass or inflammation Salivary glands: No inflammation, mass, or stone. Thyroid: Normal. Lymph nodes: None enlarged or abnormal density. Vascular: Atheromatous calcification Limited intracranial: No acute finding Visualized orbits: Bilateral cataract resection Mastoids and visualized paranasal sinuses: Clear Skeleton: Ordinary cervical spine degeneration. No acute or aggressive finding Upper chest: Emphysema. Hazy dependent opacity likely reflecting atelectasis. Small layering pleural effusions where visualized. IMPRESSION: 1. No acute finding or explanation for symptoms. 2. Aortic Atherosclerosis (ICD10-I70.0) and Emphysema (ICD10-J43.9). Electronically Signed   By: Jorje Guild M.D.   On: 11/11/2020 08:19   US Abdomen Limited RUQ (LIVER/GB)  Result Date: 11/10/2020 CLINICAL DATA:  Elevated liver function test. EXAM: ULTRASOUND ABDOMEN LIMITED RIGHT UPPER  QUADRANT COMPARISON:  None. FINDINGS: Gallbladder: Shadowing echogenic gallstones are seen within the gallbladder lumen. Echogenic sludge is also noted. The gallbladder wall measures 4.0 mm in thickness. No sonographic Murphy sign noted by sonographer. Common bile duct: Diameter: 3.4 mm Liver: No focal lesion identified. Within normal limits in parenchymal echogenicity. Portal vein is patent on color Doppler imaging with normal direction of blood flow towards the liver. Other: Of incidental note is the presence of a right-sided pleural effusion. IMPRESSION: Cholelithiasis and gallbladder sludge with gallbladder wall thickening. Acute cholecystitis cannot completely be excluded. Further evaluation with a nuclear medicine hepatobiliary scan is recommended. Electronically Signed   By: Virgina Norfolk M.D.   On: 11/10/2020 21:02    ROS negative except above Blood pressure 119/60, pulse 66, temperature 97.6 F (36.4 C), temperature source Oral, resp. rate 17, height 5\' 9"  (1.753 m), weight 87.3 kg, SpO2 93  %. Physical Exam vital signs stable afebrile no acute distress lying comfortably in the bed exam pertinent for his abdomen being soft nontender labs CT ultrasound and nuclear scan reviewed although the nuclear scan has not been officially read it looks like he does have some gallbladder filling and duodenal emptying  Assessment/Plan: Multiple medical problems including iron deficiency guaiac negative on Plavix in a patient with history of an ulcer and colon polyp as well as gallstones and elevated liver tests Plan: He will probably need an MRCP to rule out CBD stones and then when other medical issues are stable might need a repeat colonoscopy and endoscopy either as an in or outpatient and agree with clear liquids for now and holding Plavix in case procedures are needed here and we will follow with you  Hamid Brookens E 11/11/2020, 1:52 PM

## 2020-11-11 NOTE — Progress Notes (Signed)
PROGRESS NOTE        PATIENT DETAILS Name: Frederick Kirby Age: 85 y.o. Sex: male Date of Birth: 1935-05-16 Admit Date: 11/10/2020 Admitting Physician Rise Patience, MD SNK:[id number], [name], MD  Brief Narrative: Patient is a 85 y.o. male with history of CAD s/p CABG/PCI, HTN, CKD stage III, prior CVA, COPD who presented with cough/shortness of breath/hoarseness of voice/fatigue-for 2 days (having URI symptoms for 7 days)-found to have worsening anemia, AKI and transaminitis.  Subsequently admitted to the hospitalist service.  Subjective: Still has a hoarse voice.  Lying comfortably in bed.  Does not appear to be any distress.  He denies any abdominal pain.  Objective: Vitals: Blood pressure (!) 122/92, pulse 74, temperature 97.6 F (36.4 C), temperature source Oral, resp. rate 20, height 5\' 9"  (1.753 m), weight 87.3 kg, SpO2 95 %.   Exam: Gen Exam:Alert awake-not in any distress HEENT:atraumatic, normocephalic Chest: B/L clear to auscultation anteriorly CVS:S1S2 regular Abdomen:soft non tender, non distended Extremities:no edema Neurology: Non focal Skin: no rash  Pertinent Labs/Radiology: WBC: 10.3 Hb: 9.4 Na: 135 K: 4.6 Creatinine: 2.59  10/29>> RUQ ultrasound: Cholelithiasis/sludge within gallbladder wall thickening. 10/30>> CT soft tissue neck: No acute findings to explain hoarseness of voice.   10/30>> CT abdomen/pelvis: No hepatic lesion-biliary ductal dilatation  Assessment/Plan: Hoarseness of voice/sore throat: Likely viral in etiology-check strep throat/mono/respiratory virus panel.  CT soft tissue neck without any major findings.  Symptomatic anemia-microcytic-iron deficient: Suspect recent blood loss-although FOBT negative.  Transfused 2 units of PRBC-we will consult GI for possible endoscopic evaluation.  Remains on oral PPI.  Transaminitis: Unclear etiology-clinical features not suggestive of cholecystitis.  CT  imaging/RUQ ultrasound-negative for any major hepatobiliary issues.  Given hoarseness/sore throat-it could be part of the viral prodrome.  Awaiting hepatitis serology.  Will await GI opinion as well.  Do not think IV antibiotics are needed-we will discontinue for now and watch closely.  AKI on CKD stage IIIb: Suspect AKI is hemodynamically mediated-probably due to acute viral illness/prodrome-possibly from recent blood loss.  No hydronephrosis evident on CT abdomen-check UA to assess for proteinuria.  Avoid nephrotoxic agents and follow clinical course/electrolytes.  CAD-s/p remote CABG/PCI: No chest pain-shortness of breath/fatigue is from symptomatic anemia.  Will hold Plavix until anemia issue is stabilized.  HTN: BP stable-continue amlodipine and metoprolol.  HLD: Continue to hold statin until transaminitis improves.  History of CVA  BMI: Estimated body mass index is 28.42 kg/m as calculated from the following:   Height as of this encounter: 5\' 9"  (1.753 m).   Weight as of this encounter: 87.3 kg.    Procedures: None Consults: None DVT Prophylaxis: SCD's Code Status:Full code  Family Communication: Spouse [name] [phone] updated over the phone  Time spent: 35 minutes-Greater than 50% of this time was spent in counseling, explanation of diagnosis, planning of further management, and coordination of care.  Diet: Diet Order             Diet clear liquid Room service appropriate? Yes; Fluid consistency: Thin  Diet effective now                      Disposition Plan: Status is: Observation  The patient will require care spanning > 2 midnights and should be moved to inpatient because: Probable acute blood loss anemia required 2 unit of PRBC-ongoing  AKI (noted at baseline)-transaminitis (work-up in progress)-GI evaluation pending.  Not stable for discharge.    Antimicrobial agents: Anti-infectives (From admission, onward)    Start     Dose/Rate Route Frequency  Ordered Stop   11/11/20 0500  azithromycin (ZITHROMAX) 500 mg in sodium chloride 0.9 % 250 mL IVPB        500 mg 250 mL/hr over 60 Minutes Intravenous Every 24 hours 11/11/20 0410     11/11/20 0400  cefTRIAXone (ROCEPHIN) 2 g in sodium chloride 0.9 % 100 mL IVPB        2 g 200 mL/hr over 30 Minutes Intravenous Every 24 hours 11/11/20 0315          MEDICATIONS: Scheduled Meds:  amLODipine  10 mg Oral Daily   clopidogrel  75 mg Oral Daily   folic acid-pyridoxine-cyancobalamin  1 tablet Oral Daily   heparin  5,000 Units Subcutaneous Q8H   metoprolol succinate  25 mg Oral Daily   omega-3 acid ethyl esters  1 g Oral Daily   pantoprazole  40 mg Oral Daily   Continuous Infusions:  azithromycin     cefTRIAXone (ROCEPHIN)  IV     PRN Meds:.   I have personally reviewed following labs and imaging studies  LABORATORY DATA: CBC: Recent Labs  Lab 11/10/20 0931 11/10/20 2129 11/11/20 0219 11/11/20 0918  WBC 11.4* 9.9 10.6* 10.3  NEUTROABS 8.9*  --   --   --   HGB 6.5* 7.2* 7.8* 9.4*  HCT 22.5* 24.8* 26.1* 30.1*  MCV 68.2* 69.7* 69.6* 72.2*  PLT 346 251 233 264    Basic Metabolic Panel: Recent Labs  Lab 11/10/20 0931 11/10/20 2129 11/11/20 0219  NA 134*  --  135  K 4.9  --  4.6  CL 101  --  101  CO2 19*  --  21*  GLUCOSE 113*  --  96  BUN 54*  --  58*  CREATININE 2.95* 2.66* 2.59*  CALCIUM 8.8*  --  8.8*    GFR: Estimated Creatinine Clearance: 23.6 mL/min (A) (by C-G formula based on SCr of 2.59 mg/dL (H)).  Liver Function Tests: Recent Labs  Lab 11/10/20 1240  AST 412*  ALT 380*  ALKPHOS 59  BILITOT 0.9  PROT 6.4*  ALBUMIN 3.6   No results for input(s): LIPASE, AMYLASE in the last 168 hours. No results for input(s): AMMONIA in the last 168 hours.  Coagulation Profile: Recent Labs  Lab 11/11/20 0403  INR 1.2    Cardiac Enzymes: No results for input(s): CKTOTAL, CKMB, CKMBINDEX, TROPONINI in the last 168 hours.  BNP (last 3 results) No  results for input(s): PROBNP in the last 8760 hours.  Lipid Profile: No results for input(s): CHOL, HDL, LDLCALC, TRIG, CHOLHDL, LDLDIRECT in the last 72 hours.  Thyroid Function Tests: No results for input(s): TSH, T4TOTAL, FREET4, T3FREE, THYROIDAB in the last 72 hours.  Anemia Panel: Recent Labs    11/11/20 0403  VITAMINB12 3,505*  FOLATE 92.4  FERRITIN 8*  TIBC 525*  IRON 37*  RETICCTPCT 1.5    Urine analysis:    Component Value Date/Time   COLORURINE YELLOW 12/04/2013 0319   APPEARANCEUR CLOUDY (A) 12/04/2013 0319   LABSPEC 1.019 12/04/2013 0319   PHURINE 7.0 12/04/2013 0319   GLUCOSEU NEGATIVE 12/04/2013 0319   HGBUR MODERATE (A) 12/04/2013 0319   BILIRUBINUR NEGATIVE 12/04/2013 0319   KETONESUR NEGATIVE 12/04/2013 0319   PROTEINUR 30 (A) 12/04/2013 0319   UROBILINOGEN 0.2 12/04/2013 0319  NITRITE POSITIVE (A) 12/04/2013 0319   LEUKOCYTESUR LARGE (A) 12/04/2013 0319    Sepsis Labs: Lactic Acid, Venous No results found for: LATICACIDVEN  MICROBIOLOGY: Recent Results (from the past 240 hour(s))  Resp Panel by RT-PCR (Flu A&B, Covid) Nasopharyngeal Swab     Status: None   Collection Time: 11/10/20  9:24 AM   Specimen: Nasopharyngeal Swab; Nasopharyngeal(NP) swabs in vial transport medium  Result Value Ref Range Status   SARS Coronavirus 2 by RT PCR NEGATIVE NEGATIVE Final    Comment: (NOTE) SARS-CoV-2 target nucleic acids are NOT DETECTED.  The SARS-CoV-2 RNA is generally detectable in upper respiratory specimens during the acute phase of infection. The lowest concentration of SARS-CoV-2 viral copies this assay can detect is 138 copies/mL. A negative result does not preclude SARS-Cov-2 infection and should not be used as the sole basis for treatment or other patient management decisions. A negative result may occur with  improper specimen collection/handling, submission of specimen other than nasopharyngeal swab, presence of viral mutation(s) within  the areas targeted by this assay, and inadequate number of viral copies(<138 copies/mL). A negative result must be combined with clinical observations, patient history, and epidemiological information. The expected result is Negative.  Fact Sheet for Patients:  EntrepreneurPulse.com.au  Fact Sheet for Healthcare Providers:  IncredibleEmployment.be  This test is no t yet approved or cleared by the [place] FDA and  has been authorized for detection and/or diagnosis of SARS-CoV-2 by FDA under an Emergency Use Authorization (EUA). This EUA will remain  in effect (meaning this test can be used) for the duration of the COVID-19 declaration under Section 564(b)(1) of the Act, 21 U.S.C.section 360bbb-3(b)(1), unless the authorization is terminated  or revoked sooner.       Influenza A by PCR NEGATIVE NEGATIVE Final   Influenza B by PCR NEGATIVE NEGATIVE Final    Comment: (NOTE) The Xpert Xpress SARS-CoV-2/FLU/RSV plus assay is intended as an aid in the diagnosis of influenza from Nasopharyngeal swab specimens and should not be used as a sole basis for treatment. Nasal washings and aspirates are unacceptable for Xpert Xpress SARS-CoV-2/FLU/RSV testing.  Fact Sheet for Patients: EntrepreneurPulse.com.au  Fact Sheet for Healthcare Providers: IncredibleEmployment.be  This test is not yet approved or cleared by the [place] FDA and has been authorized for detection and/or diagnosis of SARS-CoV-2 by FDA under an Emergency Use Authorization (EUA). This EUA will remain in effect (meaning this test can be used) for the duration of the COVID-19 declaration under Section 564(b)(1) of the Act, 21 U.S.C. section 360bbb-3(b)(1), unless the authorization is terminated or revoked.  Performed at [facility], [place] [address] [place], [place] [address]     RADIOLOGY STUDIES/RESULTS: CT ABDOMEN PELVIS WO  CONTRAST  Result Date: 11/11/2020 CLINICAL DATA:  Epigastric pain. EXAM: CT ABDOMEN AND PELVIS WITHOUT CONTRAST TECHNIQUE: Multidetector CT imaging of the abdomen and pelvis was performed following the standard protocol without IV contrast. COMPARISON:  Abdominal ultrasound 11/10/2020 FINDINGS: Lower chest: Bilateral moderate pleural effusions with passive atelectasis of the lower lobes. Hepatobiliary: No focal hepatic lesion. No biliary duct dilatation. Common bile duct is normal. Pancreas: Pancreas is normal. No ductal dilatation. No pancreatic inflammation. Spleen: Normal spleen Adrenals/urinary tract: Adrenal glands normal. Renal cortical thinning on the LEFT. Simple cyst in the upper pole of the LEFT kidney. Ureters and bladder normal. Stomach/Bowel: Stomach, small-bowel and cecum are normal. The appendix is not identified but there is no pericecal inflammation to suggest appendicitis. Multiple diverticula of  the descending colon and sigmoid colon without acute inflammation. Vascular/Lymphatic: Abdominal aorta is normal caliber with atherosclerotic calcification. There is no retroperitoneal or periportal lymphadenopathy. No pelvic lymphadenopathy. Reproductive: Nodular prostate gland. Other: No free fluid. Musculoskeletal: Benign-appearing sclerotic lesion at L4. Aggressive osseous lesion. IMPRESSION: 1. Bilateral pleural effusions with passive atelectasis. 2. No acute findings in the abdomen pelvis. 3.  Aortic Atherosclerosis (ICD10-I70.0). Electronically Signed   By: Suzy Bouchard M.D.   On: 11/11/2020 08:52   DG Chest 2 View  Result Date: 11/10/2020 CLINICAL DATA:  Shortness of breath, cough and congestion. EXAM: CHEST - 2 VIEW COMPARISON:  11/23/2010 FINDINGS: The cardiac silhouette, mediastinal hilar contours are within normal limits given the patient's age and AP projection. Stable surgical changes related to bypass surgery. Chronic appearing bronchitic type lung changes but could not exclude  superimposed bronchitis. There is a suspected small right pleural effusion with overlying atelectasis. No definite infiltrates. IMPRESSION: 1. Chronic appearing bronchitic type lung changes but could not exclude superimposed bronchitis. 2. Small right pleural effusion with overlying atelectasis. Electronically Signed   By: Marijo Sanes M.D.   On: 11/10/2020 10:28   CT SOFT TISSUE NECK WO CONTRAST  Result Date: 11/11/2020 CLINICAL DATA:  Neck pain. EXAM: CT NECK WITHOUT CONTRAST TECHNIQUE: Multidetector CT imaging of the neck was performed following the standard protocol without intravenous contrast. COMPARISON:  None. FINDINGS: Pharynx and larynx: No evidence of mass or inflammation Salivary glands: No inflammation, mass, or stone. Thyroid: Normal. Lymph nodes: None enlarged or abnormal density. Vascular: Atheromatous calcification Limited intracranial: No acute finding Visualized orbits: Bilateral cataract resection Mastoids and visualized paranasal sinuses: Clear Skeleton: Ordinary cervical spine degeneration. No acute or aggressive finding Upper chest: Emphysema. Hazy dependent opacity likely reflecting atelectasis. Small layering pleural effusions where visualized. IMPRESSION: 1. No acute finding or explanation for symptoms. 2. Aortic Atherosclerosis (ICD10-I70.0) and Emphysema (ICD10-J43.9). Electronically Signed   By: Jorje Guild M.D.   On: 11/11/2020 08:19   US Abdomen Limited RUQ (LIVER/GB)  Result Date: 11/10/2020 CLINICAL DATA:  Elevated liver function test. EXAM: ULTRASOUND ABDOMEN LIMITED RIGHT UPPER QUADRANT COMPARISON:  None. FINDINGS: Gallbladder: Shadowing echogenic gallstones are seen within the gallbladder lumen. Echogenic sludge is also noted. The gallbladder wall measures 4.0 mm in thickness. No sonographic Murphy sign noted by sonographer. Common bile duct: Diameter: 3.4 mm Liver: No focal lesion identified. Within normal limits in parenchymal echogenicity. Portal vein is patent  on color Doppler imaging with normal direction of blood flow towards the liver. Other: Of incidental note is the presence of a right-sided pleural effusion. IMPRESSION: Cholelithiasis and gallbladder sludge with gallbladder wall thickening. Acute cholecystitis cannot completely be excluded. Further evaluation with a nuclear medicine hepatobiliary scan is recommended. Electronically Signed   By: Virgina Norfolk M.D.   On: 11/10/2020 21:02     LOS: 0 days   Oren Binet, MD  [facility]    To contact the attending provider between 7A-7P or the covering provider during after hours 7P-7A, please log into the web site [email or web address] and access using universal [place] password for that web site. If you do not have the password, please call the hospital operator.  11/11/2020, 10:30 AM

## 2020-11-11 NOTE — ED Notes (Signed)
Attempted to call report and was advised they were not aware that they were getting a pt and that this was her first notification

## 2020-11-11 NOTE — Progress Notes (Signed)
CBC sample collected at around 0230, awaiting for the results before starting another bag of blood.

## 2020-11-12 ENCOUNTER — Inpatient Hospital Stay (HOSPITAL_COMMUNITY): Payer: PPO

## 2020-11-12 DIAGNOSIS — R9431 Abnormal electrocardiogram [ECG] [EKG]: Secondary | ICD-10-CM

## 2020-11-12 LAB — CBC
HCT: 29.6 % — ABNORMAL LOW (ref 39.0–52.0)
Hemoglobin: 9.2 g/dL — ABNORMAL LOW (ref 13.0–17.0)
MCH: 22.5 pg — ABNORMAL LOW (ref 26.0–34.0)
MCHC: 31.1 g/dL (ref 30.0–36.0)
MCV: 72.4 fL — ABNORMAL LOW (ref 80.0–100.0)
Platelets: 237 10*3/uL (ref 150–400)
RBC: 4.09 MIL/uL — ABNORMAL LOW (ref 4.22–5.81)
RDW: 21.2 % — ABNORMAL HIGH (ref 11.5–15.5)
WBC: 9.6 10*3/uL (ref 4.0–10.5)
nRBC: 0.3 % — ABNORMAL HIGH (ref 0.0–0.2)

## 2020-11-12 LAB — BPAM RBC
Blood Product Expiration Date: 202211042359
Blood Product Expiration Date: 202211162359
ISSUE DATE / TIME: 202210291713
ISSUE DATE / TIME: 202210300406
Unit Type and Rh: 9500
Unit Type and Rh: 9500

## 2020-11-12 LAB — COMPREHENSIVE METABOLIC PANEL
ALT: 490 U/L — ABNORMAL HIGH (ref 0–44)
AST: 273 U/L — ABNORMAL HIGH (ref 15–41)
Albumin: 3.4 g/dL — ABNORMAL LOW (ref 3.5–5.0)
Alkaline Phosphatase: 70 U/L (ref 38–126)
Anion gap: 11 (ref 5–15)
BUN: 42 mg/dL — ABNORMAL HIGH (ref 8–23)
CO2: 21 mmol/L — ABNORMAL LOW (ref 22–32)
Calcium: 8.7 mg/dL — ABNORMAL LOW (ref 8.9–10.3)
Chloride: 103 mmol/L (ref 98–111)
Creatinine, Ser: 1.86 mg/dL — ABNORMAL HIGH (ref 0.61–1.24)
GFR, Estimated: 35 mL/min — ABNORMAL LOW (ref >60)
Glucose, Bld: 99 mg/dL (ref 70–99)
Potassium: 4.3 mmol/L (ref 3.5–5.1)
Sodium: 135 mmol/L (ref 135–145)
Total Bilirubin: 1.5 mg/dL — ABNORMAL HIGH (ref 0.3–1.2)
Total Protein: 6.4 g/dL — ABNORMAL LOW (ref 6.5–8.1)

## 2020-11-12 LAB — ECHOCARDIOGRAM COMPLETE
AR max vel: 1.45 cm2
AV Area VTI: 1.16 cm2
AV Area mean vel: 1.26 cm2
AV Mean grad: 16 mmHg
AV Peak grad: 26 mmHg
Ao pk vel: 2.55 m/s
Height: 69 in
S' Lateral: 3.2 cm
Weight: 3079.39 [oz_av]

## 2020-11-12 LAB — TYPE AND SCREEN
ABO/RH(D): O NEG
Antibody Screen: NEGATIVE
Unit division: 0
Unit division: 0

## 2020-11-12 LAB — URINALYSIS, ROUTINE W REFLEX MICROSCOPIC
Bilirubin Urine: NEGATIVE
Glucose, UA: NEGATIVE mg/dL
Ketones, ur: 20 mg/dL — AB
Leukocytes,Ua: NEGATIVE
Nitrite: NEGATIVE
Protein, ur: 30 mg/dL — AB
Specific Gravity, Urine: 1.018 (ref 1.005–1.030)
pH: 6 (ref 5.0–8.0)

## 2020-11-12 MED ORDER — FLUTICASONE PROPIONATE 50 MCG/ACT NA SUSP
2.0000 | Freq: Every day | NASAL | Status: DC
  Administered 2020-11-12 – 2020-11-14 (×2): 2 via NASAL
  Filled 2020-11-12: qty 16

## 2020-11-12 MED ORDER — MENTHOL 3 MG MT LOZG: 1.0000 | LOZENGE | OROMUCOSAL | Status: DC | PRN

## 2020-11-12 MED ORDER — DIPHENHYDRAMINE HCL 25 MG PO CAPS
25.0000 mg | ORAL_CAPSULE | Freq: Four times a day (QID) | ORAL | Status: DC | PRN
  Administered 2020-11-12: 25 mg via ORAL
  Filled 2020-11-12: qty 1

## 2020-11-12 MED ORDER — ORAL CARE MOUTH RINSE
15.0000 mL | Freq: Two times a day (BID) | OROMUCOSAL | Status: DC
  Administered 2020-11-13 – 2020-11-14 (×3): 15 mL via OROMUCOSAL

## 2020-11-12 MED ORDER — MELATONIN 5 MG PO TABS
5.0000 mg | ORAL_TABLET | Freq: Every day | ORAL | Status: DC
  Administered 2020-11-12 – 2020-11-13 (×2): 5 mg via ORAL
  Filled 2020-11-12 (×2): qty 1

## 2020-11-12 MED ORDER — POLYETHYLENE GLYCOL 3350 17 G PO PACK
17.0000 g | PACK | Freq: Every day | ORAL | Status: DC | PRN
  Administered 2020-11-12: 17 g via ORAL
  Filled 2020-11-12: qty 1

## 2020-11-12 MED ORDER — LORATADINE 10 MG PO TABS
10.0000 mg | ORAL_TABLET | Freq: Every day | ORAL | Status: DC
  Administered 2020-11-12 – 2020-11-14 (×3): 10 mg via ORAL
  Filled 2020-11-12 (×3): qty 1

## 2020-11-12 MED ORDER — OXYMETAZOLINE HCL 0.05 % NA SOLN
1.0000 | Freq: Two times a day (BID) | NASAL | Status: DC
  Administered 2020-11-12 – 2020-11-14 (×3): 1 via NASAL
  Filled 2020-11-12: qty 30

## 2020-11-12 NOTE — Progress Notes (Signed)
Patient states he has not had BM in 3 days.  Abdomen soft, non-tender, hypoactive BS x 4 quads.  Miralax given per prn order.

## 2020-11-12 NOTE — Progress Notes (Signed)
  Echocardiogram 2D Echocardiogram has been performed.  Merrie Roof F 11/12/2020, 1:16 PM

## 2020-11-12 NOTE — Progress Notes (Signed)
[facility] Gastroenterology Progress Note  TOSH GLAZE 85 y.o. [birth year]  CC: Iron deficiency anemia, abnormal LFTs   Subjective: Patient seen and examined at bedside.  No acute GI issues noted.  ROS : Afebrile.  Negative for vomiting.   Objective: Vital signs in last 24 hours: Vitals:   11/12/20 0406 11/12/20 0800  BP: (!) 132/46 (!) 135/48  Pulse: 78 (!) 29  Resp: 19 (!) 23  Temp: 98.2 F (36.8 C) 97.8 F (36.6 C)  SpO2: 97% 95%    Physical Exam:   Abdominal exam benign    Lab Results: Recent Labs    11/11/20 0918 11/12/20 0154  NA 135 135  K 4.6 4.3  CL 105 103  CO2 22 21*  GLUCOSE 101* 99  BUN 54* 42*  CREATININE 2.22* 1.86*  CALCIUM 8.6* 8.7*   Recent Labs    11/11/20 0918 11/12/20 0154  AST 400* 273*  ALT 476* 490*  ALKPHOS 66 70  BILITOT 1.3* 1.5*  PROT 6.5 6.4*  ALBUMIN 3.5 3.4*   Recent Labs    11/10/20 0931 11/10/20 2129 11/11/20 0918 11/12/20 0154  WBC 11.4*   < > 10.3 9.6  NEUTROABS 8.9*  --   --   --   HGB 6.5*   < > 9.4* 9.2*  HCT 22.5*   < > 30.1* 29.6*  MCV 68.2*   < > 72.2* 72.4*  PLT 346   < > 236 237   < > = values in this interval not displayed.   Recent Labs    11/11/20 0403  LABPROT 15.6*  INR 1.2      Assessment/Plan: -Iron deficiency anemia.  Occult blood negative patient was on Plavix.  -Abnormal LFTs.  Hepatitis panel negative.  No iron overload.  Normal INR.  Ultrasound right upper quadrant showed cholelithiasis and sludge in the gallbladder as well as mild gallbladder wall thickening.  CT abdomen pelvis without contrast showed no acute changes.  Normal HIDA scan   -History of coronary artery disease and CVA  Recommendations ---------------------------- -Monitor LFTs and hemoglobin -Follow serology for EBV and CMV -Not able to tolerate MRCP yesterday.  If no improvement in LFTs, consider retrying MRI MRCP in the next 1 to 2 days. -GI will follow   Otis Brace MD, [place] 11/12/2020, 10:10  AM  Contact #  [phone]

## 2020-11-12 NOTE — Evaluation (Signed)
Physical Therapy Evaluation Patient Details Name: Frederick Kirby MRN: [id number] DOB: Apr 22, 1935 Today's Date: 11/12/2020  History of Present Illness  Patient is a 85 y.o. male with history of CAD s/p CABG/PCI, HTN, CKD stage III, prior CVA, COPD who presented with cough/shortness of breath/hoarseness of voice/fatigue-for 2 days (having URI symptoms for 7 days)-found to have worsening anemia, AKI and transaminitis.  Clinical Impression  Patient presents with decreased mobility due to limited activity tolerance and generalized weakness.   Able today to sit at EOB and stand without much assistance, but too SOB to continue and fatigued very quickly.  Educated on use of incentive spirometer and flutter valve.  Spouse in the room and supportive.  Previously functioning independently and taking care of grandchildren at times.  Patient will benefit from skilled PT in the acute setting to allow d/c home with family support and follow up HHPT.      Recommendations for follow up therapy are one component of a multi-disciplinary discharge planning process, led by the attending physician.  Recommendations may be updated based on patient status, additional functional criteria and insurance authorization.  Follow Up Recommendations Home health PT    Assistance Recommended at Discharge Frequent or constant Supervision/Assistance  Functional Status Assessment Patient has had a recent decline in their functional status and/or demonstrates limited ability to make significant improvements in function in a reasonable and predictable amount of time  Equipment Recommendations  Rolling walker (2 wheels)    Recommendations for Other Services       Precautions / Restrictions Precautions Precautions: Fall Precaution Comments: watch O2, RR      Mobility  Bed Mobility Overal bed mobility: Needs Assistance Bed Mobility: Supine to Sit;Sit to Supine     Supine to sit: Min guard;HOB elevated Sit to supine:  Supervision   General bed mobility comments: assist for lines and guarding trunk as rising    Transfers Overall transfer level: Needs assistance Equipment used: Rolling walker (2 wheels) Transfers: Sit to/from Stand Sit to Stand: Min guard           General transfer comment: up to stand to see if breathing feels less labored, but pt unable to tolerate long due to SOB    Ambulation/Gait             General Gait Details: deferred due to SOB  Kirby            Wheelchair Mobility    Modified Rankin (Stroke Patients Only)       Balance Overall balance assessment: Needs assistance   Sitting balance-Leahy Scale: Good     Standing balance support: Reliant on assistive device for balance Standing balance-Leahy Scale: Poor                               Pertinent Vitals/Pain Pain Assessment: No/denies pain    Home Living Family/patient expects to be discharged to:: Private residence Living Arrangements: Spouse/significant other Available Help at Discharge: Family;Available 24 hours/day Type of Home: House Home Access: Level entry       Home Layout: One level Home Equipment: Grab bars - tub/shower      Prior Function Prior Level of Function : Independent/Modified Independent                     Hand Dominance        Extremity/Trunk Assessment   Upper Extremity Assessment Upper Extremity Assessment: Overall [facility]  for tasks assessed    Lower Extremity Assessment Lower Extremity Assessment: Overall WFL for tasks assessed    Cervical / Trunk Assessment Cervical / Trunk Assessment: Kyphotic  Communication   Communication: HOH  Cognition Arousal/Alertness: Awake/alert Behavior During Therapy: WFL for tasks assessed/performed Overall Cognitive Status: History of cognitive impairments - at baseline                                 General Comments: wife talking for pt; he seems oriented though frustrated by  hospitalization        General Comments General comments (skin integrity, edema, etc.): wife in the room and supportive, educated on flutter valve and incentive spirometry with pt performing each x 1 but too fatigued to continue.  Left in room and RN aware to encourage use as pt able    Exercises     Assessment/Plan    PT Assessment Patient needs continued PT services  PT Problem List Decreased mobility;Decreased activity tolerance;Decreased balance;Cardiopulmonary status limiting activity;Decreased safety awareness;Decreased knowledge of use of DME       PT Treatment Interventions DME instruction;Therapeutic activities;Therapeutic exercise;Gait training;Patient/family education;Functional mobility training;Balance training    PT Goals (Current goals can be found in the Care Plan section)  Acute Rehab PT Goals Patient Stated Goal: to breathe better PT Goal Formulation: With patient Time For Goal Achievement: 11/26/20 Potential to Achieve Goals: Good    Frequency Min 3X/week   Barriers to discharge        Co-evaluation               AM-PAC PT "6 Clicks" Mobility  Outcome Measure Help needed turning from your back to your side while in a flat bed without using bedrails?: A Little Help needed moving from lying on your back to sitting on the side of a flat bed without using bedrails?: A Little Help needed moving to and from a bed to a chair (including a wheelchair)?: A Little Help needed standing up from a chair using your arms (e.g., wheelchair or bedside chair)?: A Little Help needed to walk in hospital room?: A Little Help needed climbing 3-5 steps with a railing? : Total 6 Click Score: 16    End of Session Equipment Utilized During Treatment: Oxygen Activity Tolerance: Patient limited by fatigue Patient left: in bed;with call bell/phone within reach;with family/visitor present Nurse Communication: Mobility status PT Visit Diagnosis: Muscle weakness (generalized)  (M62.81);Difficulty in walking, not elsewhere classified (R26.2)    Time: 3212-2482 PT Time Calculation (min) (ACUTE ONLY): 17 min   Charges:   PT Evaluation $PT Eval Moderate Complexity: 1 Mod          Magda Kiel, PT Acute Rehabilitation Services NOIBB:[phone] Office:[phone] 11/12/2020   Reginia Naas 11/12/2020, 4:34 PM

## 2020-11-12 NOTE — Progress Notes (Addendum)
PROGRESS NOTE        PATIENT DETAILS Name: Frederick Kirby Age: 85 y.o. Sex: male Date of Birth: Jul 22, 1935 Admit Date: 11/10/2020 Admitting Physician Evalee Mutton Kristeen Mans, MD AJG:OTLXBW, [name], MD  Brief Narrative: Patient is a 85 y.o. male with history of CAD s/p CABG/PCI, HTN, CKD stage III, prior CVA, COPD who presented with cough/shortness of breath/hoarseness of voice/fatigue-for 2 days (having URI symptoms for 7 days)-found to have worsening anemia, AKI and transaminitis.  Subsequently admitted to the hospitalist service.  Subjective: Voice is better-less hoarseness today.  Complains of some shortness of breath today-claims his nose is also congested.  Objective: Vitals: Blood pressure (!) 135/48, pulse (!) 29, temperature 97.8 F (36.6 C), temperature source Oral, resp. rate (!) 23, height 5\' 9"  (1.753 m), weight 87.3 kg, SpO2 96 %.   Exam: Gen Exam:Alert awake-not in any distress HEENT:atraumatic, normocephalic Chest: B/L clear to auscultation anteriorly CVS:S1S2 regular Abdomen:soft non tender, non distended Extremities:no edema Neurology: Non focal Skin: no rash   Pertinent Labs/Radiology: WBC: 9.6 Hb: 9.2 Na: 135 K: 4.3 Creatinine: 1.86 AST:273 ALT:490  10/29>> COVID/influenza PCR: Negative 10/29>> acute hepatitis serology negative. 10/30>> respiratory virus panel: Negative  10/29>> RUQ ultrasound: Cholelithiasis/sludge within gallbladder wall thickening. 10/30>> CT soft tissue neck: No acute findings to explain hoarseness of voice.   10/30>> CT abdomen/pelvis: No hepatic lesion-biliary ductal dilatation 10/30>>HIDA scan:neg for cystic duct obstruction  Assessment/Plan: Hoarseness of voice/sore throat: Likely viral in etiology-strep throat/respiratory virus panel/influenza negative.  CT soft tissue neck without any major abnormalities.  Hoarseness of voice getting better-continue supportive care.   Symptomatic  anemia-microcytic-iron deficient: Suspect recent blood loss-although FOBT negative.  Transfused 2 units of PRBC-hemoglobin now stable.  Will defer timing of endoscopic evaluation to gastroenterology.  Continue PPI.   Transaminitis: Unclear etiology-concern that this may be viral etiology given that he had hoarseness/sore throat.  Acute hepatitis serology negative. CMV/EBV serology pending. This am he is complaining of some shortness of breath today-BNP is elevated-although he does not have significant signs of volume overload will obtain Echo to ensure he does not have any heart failure causing hepatic congestion.  Patient unable to tolerate MRCP yesterday-we will await further recommendations from GI.    AKI on CKD stage IIIb: Suspect AKI is hemodynamically mediated-probably due to acute viral illness/prodrome-possibly from recent blood loss.  No hydronephrosis evident on CT abdomen-UA without proteinuria. Improving with supportive care-avoid nephrotoxic agents-and follow lytes  CAD-s/p remote CABG/PCI: No chest pain-shortness of breath/fatigue is from symptomatic anemia.  Will hold Plavix until anemia issue is stabilized.  HTN: BP stable-continue amlodipine and metoprolol.  HLD: Continue to hold statin until transaminitis improves.  History of CVA  BMI: Estimated body mass index is 28.42 kg/m as calculated from the following:   Height as of this encounter: 5\' 9"  (1.753 m).   Weight as of this encounter: 87.3 kg.    Procedures: None Consults: None DVT Prophylaxis: SCD's Code Status:Full code left VM Family Communication: Spouse [name] [phone] -called on 10/31-  Time spent: 35 minutes-Greater than 50% of this time was spent in counseling, explanation of diagnosis, planning of further management, and coordination of care.  Diet: Diet Order             Diet clear liquid Room service appropriate? Yes; Fluid consistency: Thin  Diet effective now  Disposition Plan: Status is: Observation  The patient will require care spanning > 2 midnights and should be moved to inpatient because: Probable acute blood loss anemia required 2 unit of PRBC-ongoing AKI (noted at baseline)-transaminitis (work-up in progress)-GI evaluation pending.  Not stable for discharge.    Antimicrobial agents: Anti-infectives (From admission, onward)    Start     Dose/Rate Route Frequency Ordered Stop   11/11/20 0500  azithromycin (ZITHROMAX) 500 mg in sodium chloride 0.9 % 250 mL IVPB  Status:  Discontinued        500 mg 250 mL/hr over 60 Minutes Intravenous Every 24 hours 11/11/20 0410 11/11/20 1048   11/11/20 0400  cefTRIAXone (ROCEPHIN) 2 g in sodium chloride 0.9 % 100 mL IVPB  Status:  Discontinued        2 g 200 mL/hr over 30 Minutes Intravenous Every 24 hours 11/11/20 0315 11/11/20 1048        MEDICATIONS: Scheduled Meds:  amLODipine  10 mg Oral Daily   fluticasone  2 spray Each Nare Daily   folic acid-pyridoxine-cyancobalamin  1 tablet Oral Daily   loratadine  10 mg Oral Daily   metoprolol succinate  25 mg Oral Daily   omega-3 acid ethyl esters  1 g Oral Daily   oxymetazoline  1 spray Each Nare BID   pantoprazole  40 mg Oral BID   Continuous Infusions:   PRN Meds:.   I have personally reviewed following labs and imaging studies  LABORATORY DATA: CBC: Recent Labs  Lab 11/10/20 0931 11/10/20 2129 11/11/20 0219 11/11/20 0918 11/12/20 0154  WBC 11.4* 9.9 10.6* 10.3 9.6  NEUTROABS 8.9*  --   --   --   --   HGB 6.5* 7.2* 7.8* 9.4* 9.2*  HCT 22.5* 24.8* 26.1* 30.1* 29.6*  MCV 68.2* 69.7* 69.6* 72.2* 72.4*  PLT 346 251 233 236 237     Basic Metabolic Panel: Recent Labs  Lab 11/10/20 0931 11/10/20 2129 11/11/20 0219 11/11/20 0918 11/12/20 0154  NA 134*  --  135 135 135  K 4.9  --  4.6 4.6 4.3  CL 101  --  101 105 103  CO2 19*  --  21* 22 21*  GLUCOSE 113*  --  96 101* 99  BUN 54*  --  58* 54* 42*  CREATININE 2.95*  2.66* 2.59* 2.22* 1.86*  CALCIUM 8.8*  --  8.8* 8.6* 8.7*     GFR: Estimated Creatinine Clearance: 32.9 mL/min (A) (by C-G formula based on SCr of 1.86 mg/dL (H)).  Liver Function Tests: Recent Labs  Lab 11/10/20 1240 11/11/20 0918 11/12/20 0154  AST 412* 400* 273*  ALT 380* 476* 490*  ALKPHOS 59 66 70  BILITOT 0.9 1.3* 1.5*  PROT 6.4* 6.5 6.4*  ALBUMIN 3.6 3.5 3.4*    No results for input(s): LIPASE, AMYLASE in the last 168 hours. No results for input(s): AMMONIA in the last 168 hours.  Coagulation Profile: Recent Labs  Lab 11/11/20 0403  INR 1.2     Cardiac Enzymes: No results for input(s): CKTOTAL, CKMB, CKMBINDEX, TROPONINI in the last 168 hours.  BNP (last 3 results) No results for input(s): PROBNP in the last 8760 hours.  Lipid Profile: No results for input(s): CHOL, HDL, LDLCALC, TRIG, CHOLHDL, LDLDIRECT in the last 72 hours.  Thyroid Function Tests: No results for input(s): TSH, T4TOTAL, FREET4, T3FREE, THYROIDAB in the last 72 hours.  Anemia Panel: Recent Labs    11/11/20 0403  VITAMINB12 3,505*  FOLATE 92.4  FERRITIN 8*  TIBC 525*  IRON 37*  RETICCTPCT 1.5     Urine analysis:    Component Value Date/Time   COLORURINE YELLOW 11/12/2020 0750   APPEARANCEUR CLEAR 11/12/2020 0750   LABSPEC 1.018 11/12/2020 0750   PHURINE 6.0 11/12/2020 0750   GLUCOSEU NEGATIVE 11/12/2020 0750   HGBUR SMALL (A) 11/12/2020 0750   BILIRUBINUR NEGATIVE 11/12/2020 0750   KETONESUR 20 (A) 11/12/2020 0750   PROTEINUR 30 (A) 11/12/2020 0750   UROBILINOGEN 0.2 12/04/2013 0319   NITRITE NEGATIVE 11/12/2020 0750   LEUKOCYTESUR NEGATIVE 11/12/2020 0750    Sepsis Labs: Lactic Acid, Venous No results found for: LATICACIDVEN  MICROBIOLOGY: Recent Results (from the past 240 hour(s))  Resp Panel by RT-PCR (Flu A&B, Covid) Nasopharyngeal Swab     Status: None   Collection Time: 11/10/20  9:24 AM   Specimen: Nasopharyngeal Swab; Nasopharyngeal(NP) swabs in vial  transport medium  Result Value Ref Range Status   SARS Coronavirus 2 by RT PCR NEGATIVE NEGATIVE Final    Comment: (NOTE) SARS-CoV-2 target nucleic acids are NOT DETECTED.  The SARS-CoV-2 RNA is generally detectable in upper respiratory specimens during the acute phase of infection. The lowest concentration of SARS-CoV-2 viral copies this assay can detect is 138 copies/mL. A negative result does not preclude SARS-Cov-2 infection and should not be used as the sole basis for treatment or other patient management decisions. A negative result may occur with  improper specimen collection/handling, submission of specimen other than nasopharyngeal swab, presence of viral mutation(s) within the areas targeted by this assay, and inadequate number of viral copies(<138 copies/mL). A negative result must be combined with clinical observations, patient history, and epidemiological information. The expected result is Negative.  Fact Sheet for Patients:  EntrepreneurPulse.com.au  Fact Sheet for Healthcare Providers:  IncredibleEmployment.be  This test is no t yet approved or cleared by the [place] FDA and  has been authorized for detection and/or diagnosis of SARS-CoV-2 by FDA under an Emergency Use Authorization (EUA). This EUA will remain  in effect (meaning this test can be used) for the duration of the COVID-19 declaration under Section 564(b)(1) of the Act, 21 U.S.C.section 360bbb-3(b)(1), unless the authorization is terminated  or revoked sooner.       Influenza A by PCR NEGATIVE NEGATIVE Final   Influenza B by PCR NEGATIVE NEGATIVE Final    Comment: (NOTE) The Xpert Xpress SARS-CoV-2/FLU/RSV plus assay is intended as an aid in the diagnosis of influenza from Nasopharyngeal swab specimens and should not be used as a sole basis for treatment. Nasal washings and aspirates are unacceptable for Xpert Xpress SARS-CoV-2/FLU/RSV testing.  Fact  Sheet for Patients: EntrepreneurPulse.com.au  Fact Sheet for Healthcare Providers: IncredibleEmployment.be  This test is not yet approved or cleared by the [place] FDA and has been authorized for detection and/or diagnosis of SARS-CoV-2 by FDA under an Emergency Use Authorization (EUA). This EUA will remain in effect (meaning this test can be used) for the duration of the COVID-19 declaration under Section 564(b)(1) of the Act, 21 U.S.C. section 360bbb-3(b)(1), unless the authorization is terminated or revoked.  Performed at [facility], [place] [address] [place], [place] [address]   Group A Strep by PCR     Status: None   Collection Time: 11/11/20  4:30 PM   Specimen: Throat; Sterile Swab  Result Value Ref Range Status   Group A Strep by PCR NOT DETECTED NOT DETECTED Final    Comment: Performed at [facility], [address] [place], [place] [address]  Respiratory (~20 pathogens) panel by PCR     Status: None   Collection Time: 11/11/20  4:30 PM   Specimen: Nasopharyngeal Swab; Respiratory  Result Value Ref Range Status   Adenovirus NOT DETECTED NOT DETECTED Final   Coronavirus 229E NOT DETECTED NOT DETECTED Final    Comment: (NOTE) The Coronavirus on the Respiratory Panel, DOES NOT test for the novel  Coronavirus (2019 nCoV)    Coronavirus HKU1 NOT DETECTED NOT DETECTED Final   Coronavirus NL63 NOT DETECTED NOT DETECTED Final   Coronavirus OC43 NOT DETECTED NOT DETECTED Final   Metapneumovirus NOT DETECTED NOT DETECTED Final   Rhinovirus / Enterovirus NOT DETECTED NOT DETECTED Final   Influenza A NOT DETECTED NOT DETECTED Final   Influenza B NOT DETECTED NOT DETECTED Final   Parainfluenza Virus 1 NOT DETECTED NOT DETECTED Final   Parainfluenza Virus 2 NOT DETECTED NOT DETECTED Final   Parainfluenza Virus 3 NOT DETECTED NOT DETECTED Final   Parainfluenza Virus 4 NOT DETECTED NOT DETECTED Final   Respiratory Syncytial  Virus NOT DETECTED NOT DETECTED Final   Bordetella pertussis NOT DETECTED NOT DETECTED Final   Bordetella Parapertussis NOT DETECTED NOT DETECTED Final   Chlamydophila pneumoniae NOT DETECTED NOT DETECTED Final   Mycoplasma pneumoniae NOT DETECTED NOT DETECTED Final    Comment: Performed at [facility], [place]. [address] [place], [place] [address]    RADIOLOGY STUDIES/RESULTS: CT ABDOMEN PELVIS WO CONTRAST  Result Date: 11/11/2020 CLINICAL DATA:  Epigastric pain. EXAM: CT ABDOMEN AND PELVIS WITHOUT CONTRAST TECHNIQUE: Multidetector CT imaging of the abdomen and pelvis was performed following the standard protocol without IV contrast. COMPARISON:  Abdominal ultrasound 11/10/2020 FINDINGS: Lower chest: Bilateral moderate pleural effusions with passive atelectasis of the lower lobes. Hepatobiliary: No focal hepatic lesion. No biliary duct dilatation. Common bile duct is normal. Pancreas: Pancreas is normal. No ductal dilatation. No pancreatic inflammation. Spleen: Normal spleen Adrenals/urinary tract: Adrenal glands normal. Renal cortical thinning on the LEFT. Simple cyst in the upper pole of the LEFT kidney. Ureters and bladder normal. Stomach/Bowel: Stomach, small-bowel and cecum are normal. The appendix is not identified but there is no pericecal inflammation to suggest appendicitis. Multiple diverticula of the descending colon and sigmoid colon without acute inflammation. Vascular/Lymphatic: Abdominal aorta is normal caliber with atherosclerotic calcification. There is no retroperitoneal or periportal lymphadenopathy. No pelvic lymphadenopathy. Reproductive: Nodular prostate gland. Other: No free fluid. Musculoskeletal: Benign-appearing sclerotic lesion at L4. Aggressive osseous lesion. IMPRESSION: 1. Bilateral pleural effusions with passive atelectasis. 2. No acute findings in the abdomen pelvis. 3.  Aortic Atherosclerosis (ICD10-I70.0). Electronically Signed   By: Suzy Bouchard M.D.   On:  11/11/2020 08:52   CT SOFT TISSUE NECK WO CONTRAST  Result Date: 11/11/2020 CLINICAL DATA:  Neck pain. EXAM: CT NECK WITHOUT CONTRAST TECHNIQUE: Multidetector CT imaging of the neck was performed following the standard protocol without intravenous contrast. COMPARISON:  None. FINDINGS: Pharynx and larynx: No evidence of mass or inflammation Salivary glands: No inflammation, mass, or stone. Thyroid: Normal. Lymph nodes: None enlarged or abnormal density. Vascular: Atheromatous calcification Limited intracranial: No acute finding Visualized orbits: Bilateral cataract resection Mastoids and visualized paranasal sinuses: Clear Skeleton: Ordinary cervical spine degeneration. No acute or aggressive finding Upper chest: Emphysema. Hazy dependent opacity likely reflecting atelectasis. Small layering pleural effusions where visualized. IMPRESSION: 1. No acute finding or explanation for symptoms. 2. Aortic Atherosclerosis (ICD10-I70.0) and Emphysema (ICD10-J43.9). Electronically Signed   By: Roderic Palau  [place] M.D.   On: 11/11/2020 08:19   NM Hepatobiliary Liver Func  Result Date: 11/11/2020 CLINICAL DATA:  Abdominal pain, biliary colic. EXAM: NUCLEAR MEDICINE HEPATOBILIARY IMAGING TECHNIQUE: Sequential images of the abdomen were obtained out to 60 minutes following intravenous administration of radiopharmaceutical. RADIOPHARMACEUTICALS:  5.3 mCi Tc-70m  Choletec IV COMPARISON:  November 10, 2020. FINDINGS: Prompt uptake and biliary excretion of activity by the liver is seen. Gallbladder activity is visualized, consistent with patency of cystic duct. Biliary activity passes into small bowel, consistent with patent common bile duct. IMPRESSION: Normal uptake is seen within the gallbladder consistent with patency of the cystic duct. No definite evidence of cholecystitis is noted. Electronically Signed   By: Marijo Conception M.D.   On: 11/11/2020 14:46   US Abdomen Limited RUQ (LIVER/GB)  Result Date:  11/10/2020 CLINICAL DATA:  Elevated liver function test. EXAM: ULTRASOUND ABDOMEN LIMITED RIGHT UPPER QUADRANT COMPARISON:  None. FINDINGS: Gallbladder: Shadowing echogenic gallstones are seen within the gallbladder lumen. Echogenic sludge is also noted. The gallbladder wall measures 4.0 mm in thickness. No sonographic Murphy sign noted by sonographer. Common bile duct: Diameter: 3.4 mm Liver: No focal lesion identified. Within normal limits in parenchymal echogenicity. Portal vein is patent on color Doppler imaging with normal direction of blood flow towards the liver. Other: Of incidental note is the presence of a right-sided pleural effusion. IMPRESSION: Cholelithiasis and gallbladder sludge with gallbladder wall thickening. Acute cholecystitis cannot completely be excluded. Further evaluation with a nuclear medicine hepatobiliary scan is recommended. Electronically Signed   By: Virgina Norfolk M.D.   On: 11/10/2020 21:02     LOS: 1 day   Oren Binet, MD  [facility]    To contact the attending provider between 7A-7P or the covering provider during after hours 7P-7A, please log into the web site [email or web address] and access using universal [place] password for that web site. If you do not have the password, please call the hospital operator.  11/12/2020, 12:31 PM

## 2020-11-12 NOTE — Progress Notes (Signed)
Patient unable to tolerate laying flat.  States he feels like he cannot catch his breath.  MRI notified patient unable to lay flat for MRCP at this time.

## 2020-11-13 LAB — COMPREHENSIVE METABOLIC PANEL
ALT: 323 U/L — ABNORMAL HIGH (ref 0–44)
AST: 92 U/L — ABNORMAL HIGH (ref 15–41)
Albumin: 3.2 g/dL — ABNORMAL LOW (ref 3.5–5.0)
Alkaline Phosphatase: 65 U/L (ref 38–126)
Anion gap: 10 (ref 5–15)
BUN: 32 mg/dL — ABNORMAL HIGH (ref 8–23)
CO2: 21 mmol/L — ABNORMAL LOW (ref 22–32)
Calcium: 8.6 mg/dL — ABNORMAL LOW (ref 8.9–10.3)
Chloride: 103 mmol/L (ref 98–111)
Creatinine, Ser: 1.77 mg/dL — ABNORMAL HIGH (ref 0.61–1.24)
GFR, Estimated: 38 mL/min — ABNORMAL LOW (ref >60)
Glucose, Bld: 120 mg/dL — ABNORMAL HIGH (ref 70–99)
Potassium: 4.1 mmol/L (ref 3.5–5.1)
Sodium: 134 mmol/L — ABNORMAL LOW (ref 135–145)
Total Bilirubin: 0.7 mg/dL (ref 0.3–1.2)
Total Protein: 6.3 g/dL — ABNORMAL LOW (ref 6.5–8.1)

## 2020-11-13 LAB — EPSTEIN-BARR VIRUS VCA, IGM: EBV VCA IgM: <36 U/mL (ref 0.0–35.9)

## 2020-11-13 LAB — CBC
HCT: 30.3 % — ABNORMAL LOW (ref 39.0–52.0)
Hemoglobin: 9.4 g/dL — ABNORMAL LOW (ref 13.0–17.0)
MCH: 22.3 pg — ABNORMAL LOW (ref 26.0–34.0)
MCHC: 31 g/dL (ref 30.0–36.0)
MCV: 72 fL — ABNORMAL LOW (ref 80.0–100.0)
Platelets: 242 10*3/uL (ref 150–400)
RBC: 4.21 MIL/uL — ABNORMAL LOW (ref 4.22–5.81)
RDW: 23 % — ABNORMAL HIGH (ref 11.5–15.5)
WBC: 8.5 10*3/uL (ref 4.0–10.5)
nRBC: 0.2 % (ref 0.0–0.2)

## 2020-11-13 LAB — EPSTEIN-BARR VIRUS VCA, IGG: EBV VCA IgG: >600 U/mL — ABNORMAL HIGH (ref 0.0–17.9)

## 2020-11-13 LAB — CMV IGM: CMV IgM: <30 AU/mL (ref 0.0–29.9)

## 2020-11-13 NOTE — Evaluation (Signed)
Occupational Therapy Evaluation Patient Details Name: Frederick Kirby MRN: [id number] DOB: [birth year] Today's Date: 11/13/2020   History of Present Illness Patient is a 85 y.o. male with history of CAD s/p CABG/PCI, HTN, CKD stage III, prior CVA, COPD who presented with cough/shortness of breath/hoarseness of voice/fatigue-for 2 days (having URI symptoms for 7 days)-found to have worsening anemia, AKI and transaminitis.   Clinical Impression   Pt presents with decreased balance and activity tolerance. Pt currently requiring supervision - Min guard with LB ADLs and functional transfers/mobility. Pt reports being independent at baseline and lives with spouse who will be available to provide assistance as needed at home. Educated pt on DME use. Pt should be safe to return home without any further skilled OT services once medically cleared. Would likely benefit from [facility] for use as shower seat to improve safety and conserve energy with bathing at home.Will follow acutely to improve safety/independence with ADLs and educate on energy conservation.     Recommendations for follow up therapy are one component of a multi-disciplinary discharge planning process, led by the attending physician.  Recommendations may be updated based on patient status, additional functional criteria and insurance authorization.   Follow Up Recommendations  No OT follow up    Assistance Recommended at Discharge Intermittent Supervision/Assistance  Functional Status Assessment  Patient has had a recent decline in their functional status and demonstrates the ability to make significant improvements in function in a reasonable and predictable amount of time.  Equipment Recommendations  [facility]    Recommendations for Other Services       Precautions / Restrictions Precautions Precautions: Fall Precaution Comments: watch O2, RR Restrictions Weight Bearing Restrictions: No      Mobility Bed Mobility Overal bed mobility:  Needs Assistance Bed Mobility: Supine to Sit;Sit to Supine     Supine to sit: Supervision Sit to supine: Supervision        Transfers Overall transfer level: Needs assistance Equipment used: Rolling walker (2 wheels) Transfers: Sit to/from Stand Sit to Stand: Min guard                  Balance Overall balance assessment: Needs assistance   Sitting balance-Leahy Scale: Good     Standing balance support: Bilateral upper extremity supported;During functional activity Standing balance-Leahy Scale: Poor                             ADL either performed or assessed with clinical judgement   ADL Overall ADL's : Needs assistance/impaired Eating/Feeding: Independent   Grooming: Supervision/safety;Standing   Upper Body Bathing: Independent;Sitting   Lower Body Bathing: Min guard;Sit to/from stand   Upper Body Dressing : Independent;Sitting   Lower Body Dressing: Min guard;Sit to/from stand   Toilet Transfer: Min guard;Stand-pivot;BSC;Rolling walker (2 wheels)   Toileting- Water quality scientist and Hygiene: Supervision/safety       Functional mobility during ADLs: Min guard;Rolling walker (2 wheels) General ADL Comments: Pt limited by balance and activity tolerance.     Vision   Vision Assessment?: No apparent visual deficits     Perception     Praxis      Pertinent Vitals/Pain Pain Assessment: No/denies pain     Hand Dominance     Extremity/Trunk Assessment Upper Extremity Assessment Upper Extremity Assessment: Overall WFL for tasks assessed   Lower Extremity Assessment Lower Extremity Assessment: Defer to PT evaluation   Cervical / Trunk Assessment Cervical / Trunk Assessment: Kyphotic  Communication Communication Communication: HOH   Cognition Arousal/Alertness: Awake/alert Behavior During Therapy: WFL for tasks assessed/performed Overall Cognitive Status: History of cognitive impairments - at baseline                                        General Comments  Pt O2 90% on RA upon arrival. Pt reports [place] "must have come off while I was sleeping." Reapplied O2 4L [place] and O2 rose to 97% at rest. Decreased to 92% after performing stand pivot transfer from EOB to [facility] and recovered to 97% after approx. 30 secs - 1 min of rest.    Exercises     Shoulder Instructions      Home Living Family/patient expects to be discharged to:: Private residence Living Arrangements: Spouse/significant other Available Help at Discharge: Family;Available 24 hours/day Type of Home: House Home Access: Level entry     Home Layout: One level     Bathroom Shower/Tub: Teacher, early years/pre: Standard     Home Equipment: Grab bars - tub/shower          Prior Functioning/Environment Prior Level of Function : Independent/Modified Independent                        OT Problem List: Decreased activity tolerance;Impaired balance (sitting and/or standing);Decreased knowledge of use of DME or AE;Decreased safety awareness;Cardiopulmonary status limiting activity      OT Treatment/Interventions: Self-care/ADL training;Therapeutic exercise;Neuromuscular education;Energy conservation;DME and/or AE instruction;Therapeutic activities;Patient/family education;Balance training    OT Goals(Current goals can be found in the care plan section) Acute Rehab OT Goals Patient Stated Goal: Return home OT Goal Formulation: With patient Time For Goal Achievement: 11/27/20 Potential to Achieve Goals: Good  OT Frequency: Min 2X/week   Barriers to D/C:            Co-evaluation              AM-PAC OT "6 Clicks" Daily Activity     Outcome Measure Help from another person eating meals?: None Help from another person taking care of personal grooming?: A Little Help from another person toileting, which includes using toliet, bedpan, or urinal?: A Little Help from another person bathing (including washing,  rinsing, drying)?: A Little Help from another person to put on and taking off regular upper body clothing?: None Help from another person to put on and taking off regular lower body clothing?: A Little 6 Click Score: 20   End of Session Equipment Utilized During Treatment: Rolling walker (2 wheels) Nurse Communication: Mobility status  Activity Tolerance: Patient tolerated treatment well Patient left: in bed;with call bell/phone within reach;with bed alarm set  OT Visit Diagnosis: Unsteadiness on feet (R26.81)                Time: 3212-2482 OT Time Calculation (min): 18 min Charges:  OT General Charges $OT Visit: 1 Visit OT Evaluation $OT Eval Moderate Complexity: 1 Mod  Harrel Ferrone C, OT/L  Acute Rehab [place] 11/13/2020, 10:07 AM

## 2020-11-13 NOTE — Progress Notes (Signed)
[facility] Gastroenterology Progress Note  Frederick Kirby 85 y.o. Dec 13, 1935  CC: Iron deficiency anemia, abnormal LFTs   Subjective: Patient seen and examined at bedside.  Some shortness of breath noted.  He denies any acute GI issues.  ROS : Afebrile.  Negative for vomiting.   Objective: Vital signs in last 24 hours: Vitals:   11/13/20 0434 11/13/20 1155  BP: (!) 129/49 (!) 129/47  Pulse: 86 78  Resp: 20 20  Temp: 98.1 F (36.7 C) 97.8 F (36.6 C)  SpO2: 96% 100%    Physical Exam:   Abdominal exam benign    Lab Results: Recent Labs    11/12/20 0154 11/13/20 0930  NA 135 134*  K 4.3 4.1  CL 103 103  CO2 21* 21*  GLUCOSE 99 120*  BUN 42* 32*  CREATININE 1.86* 1.77*  CALCIUM 8.7* 8.6*   Recent Labs    11/12/20 0154 11/13/20 0930  AST 273* 92*  ALT 490* 323*  ALKPHOS 70 65  BILITOT 1.5* 0.7  PROT 6.4* 6.3*  ALBUMIN 3.4* 3.2*   Recent Labs    11/12/20 0154 11/13/20 0930  WBC 9.6 8.5  HGB 9.2* 9.4*  HCT 29.6* 30.3*  MCV 72.4* 72.0*  PLT 237 242   Recent Labs    11/11/20 0403  LABPROT 15.6*  INR 1.2      Assessment/Plan: -Iron deficiency anemia.  Occult blood negative .patient was on Plavix.  -Abnormal LFTs.  Hepatitis panel negative.  No iron overload.  Normal INR.  Ultrasound right upper quadrant showed cholelithiasis and sludge in the gallbladder as well as mild gallbladder wall thickening.  CT abdomen pelvis without contrast showed no acute changes.  Normal HIDA scan   -History of coronary artery disease and CVA  Recommendations ---------------------------- -Patient's LFTs are improving.  I do not think there is a need for MRI MRCP at this point. -Also his hemoglobin is stable. -He does have mild shortness of breath and currently on 4 L of oxygen. -Follow serology for EBV and CMV -Discussed with hospitalist.  No plan for inpatient endoscopic work-up at this time. -Okay to continue Plavix from GI standpoint -GI will sign off.  Call us  back if needed -Follow-up in GI clinic in 2 to 3 months after discharge for anemia and abnormal LFTs.   Otis Brace MD, [place] 11/13/2020, 1:55 PM  Contact #  [phone]

## 2020-11-13 NOTE — Progress Notes (Signed)
PROGRESS NOTE        PATIENT DETAILS Name: Frederick Kirby Age: 85 y.o. Sex: male Date of Birth: [birth year] Admit Date: 11/10/2020 Admitting Physician Evalee Mutton Kristeen Mans, MD MPN:[email or web address], [name], MD  Brief Narrative: Patient is a 85 y.o. male with history of CAD s/p CABG/PCI, HTN, CKD stage III, prior CVA, COPD who presented with cough/shortness of breath/hoarseness of voice/fatigue-for 2 days (having URI symptoms for 7 days)-found to have worsening anemia, AKI and transaminitis.  Subsequently admitted to the hospitalist service.  Subjective: Lying comfortably in bed-hoarseness of voice is gradually improving.  Objective: Vitals: Blood pressure (!) 129/47, pulse 78, temperature 97.8 F (36.6 C), temperature source Oral, resp. rate 20, height 5\' 9"  (1.753 m), weight 87.3 kg, SpO2 100 %.   Exam: Gen Exam:Alert awake-not in any distress HEENT:atraumatic, normocephalic Chest: B/L clear to auscultation anteriorly CVS:S1S2 regular Abdomen:soft non tender, non distended Extremities:no edema Neurology: Non focal Skin: no rash   Pertinent Labs/Radiology: WBC: 8.5 Hb: 9.4 Na: 134 K: 4.1 Creatinine: 1.77 AST:92 ALT:323  10/29>> COVID/influenza PCR: Negative 10/29>> acute hepatitis serology negative. 10/30>> respiratory virus panel: Negative  10/29>> RUQ ultrasound: Cholelithiasis/sludge within gallbladder wall thickening. 10/30>> CT soft tissue neck: No acute findings to explain hoarseness of voice.   10/30>> CT abdomen/pelvis: No hepatic lesion-biliary ductal dilatation 10/30>>HIDA scan:neg for cystic duct obstruction 10/31>> Echo: EF 60-65%  Assessment/Plan: Hoarseness of voice/sore throat cough: Likely viral in etiology-strep throat/respiratory virus panel/influenza negative.  CT soft tissue neck without any major abnormalities.  Hoarseness of voice getting better-continue supportive care.  Still complains of some intermittent shortness of  breath-we will repeat chest x-ray in the morning.  Symptomatic anemia-microcytic-iron deficient: Suspect recent blood loss-although FOBT negative.  Transfused 2 units of PRBC-hemoglobin now stable.  GI recommending outpatient endoscopic evaluation.  Continue PPI.  Transaminitis: Unclear etiology-concern that this may be viral etiology given that he had hoarseness/sore throat.  Acute hepatitis serology negative. CMV/EBV serology pending.  Echo with stable EF-unlikely to be congestive hepatitis.  Discussed with Dr. Williams Che MD-LFTs now improving-we both feel like an MRCP is not needed at this point.  Continue to follow LFTs.  AKI on CKD stage IIIb: Suspect AKI is hemodynamically mediated-probably due to acute viral illness/prodrome-possibly from recent blood loss.  No hydronephrosis evident on CT abdomen-UA without proteinuria. Improving with supportive care-avoid nephrotoxic agents-and follow lytes  CAD-s/p remote CABG/PCI: No chest pain-shortness of breath/fatigue is from symptomatic anemia.  Will hold Plavix until anemia issue is stabilized.  HTN: BP stable-continue amlodipine and metoprolol.  HLD: Continue to hold statin until transaminitis improves.  History of CVA  BMI: Estimated body mass index is 28.42 kg/m as calculated from the following:   Height as of this encounter: 5\' 9"  (1.753 m).   Weight as of this encounter: 87.3 kg.    Procedures: None Consults: None DVT Prophylaxis: SCD's Code Status:Full code left VM Family Communication: Spouse [name] [phone] -called on 11/1  Time spent: 35 minutes-Greater than 50% of this time was spent in counseling, explanation of diagnosis, planning of further management, and coordination of care.  Diet: Diet Order             Diet Heart Room service appropriate? Yes; Fluid consistency: Thin  Diet effective now  Disposition Plan: Status is: Observation  The patient will require care spanning > 2  midnights and should be moved to inpatient because: Probable acute blood loss anemia required 2 unit of PRBC-ongoing AKI (noted at baseline)-transaminitis (work-up in progress)-GI evaluation pending.  Not stable for discharge.    Antimicrobial agents: Anti-infectives (From admission, onward)    Start     Dose/Rate Route Frequency Ordered Stop   11/11/20 0500  azithromycin (ZITHROMAX) 500 mg in sodium chloride 0.9 % 250 mL IVPB  Status:  Discontinued        500 mg 250 mL/hr over 60 Minutes Intravenous Every 24 hours 11/11/20 0410 11/11/20 1048   11/11/20 0400  cefTRIAXone (ROCEPHIN) 2 g in sodium chloride 0.9 % 100 mL IVPB  Status:  Discontinued        2 g 200 mL/hr over 30 Minutes Intravenous Every 24 hours 11/11/20 0315 11/11/20 1048        MEDICATIONS: Scheduled Meds:  amLODipine  10 mg Oral Daily   fluticasone  2 spray Each Nare Daily   folic acid-pyridoxine-cyancobalamin  1 tablet Oral Daily   loratadine  10 mg Oral Daily   mouth rinse  15 mL Mouth Rinse BID   melatonin  5 mg Oral QHS   metoprolol succinate  25 mg Oral Daily   omega-3 acid ethyl esters  1 g Oral Daily   oxymetazoline  1 spray Each Nare BID   pantoprazole  40 mg Oral BID   Continuous Infusions:   PRN Meds:.   I have personally reviewed following labs and imaging studies  LABORATORY DATA: CBC: Recent Labs  Lab 11/10/20 0931 11/10/20 2129 11/11/20 0219 11/11/20 0918 11/12/20 0154 11/13/20 0930  WBC 11.4* 9.9 10.6* 10.3 9.6 8.5  NEUTROABS 8.9*  --   --   --   --   --   HGB 6.5* 7.2* 7.8* 9.4* 9.2* 9.4*  HCT 22.5* 24.8* 26.1* 30.1* 29.6* 30.3*  MCV 68.2* 69.7* 69.6* 72.2* 72.4* 72.0*  PLT 346 251 233 236 237 242     Basic Metabolic Panel: Recent Labs  Lab 11/10/20 0931 11/10/20 2129 11/11/20 0219 11/11/20 0918 11/12/20 0154 11/13/20 0930  NA 134*  --  135 135 135 134*  K 4.9  --  4.6 4.6 4.3 4.1  CL 101  --  101 105 103 103  CO2 19*  --  21* 22 21* 21*  GLUCOSE 113*  --  96 101*  99 120*  BUN 54*  --  58* 54* 42* 32*  CREATININE 2.95* 2.66* 2.59* 2.22* 1.86* 1.77*  CALCIUM 8.8*  --  8.8* 8.6* 8.7* 8.6*     GFR: Estimated Creatinine Clearance: 34.6 mL/min (A) (by C-G formula based on SCr of 1.77 mg/dL (H)).  Liver Function Tests: Recent Labs  Lab 11/10/20 1240 11/11/20 0918 11/12/20 0154 11/13/20 0930  AST 412* 400* 273* 92*  ALT 380* 476* 490* 323*  ALKPHOS 59 66 70 65  BILITOT 0.9 1.3* 1.5* 0.7  PROT 6.4* 6.5 6.4* 6.3*  ALBUMIN 3.6 3.5 3.4* 3.2*    No results for input(s): LIPASE, AMYLASE in the last 168 hours. No results for input(s): AMMONIA in the last 168 hours.  Coagulation Profile: Recent Labs  Lab 11/11/20 0403  INR 1.2     Cardiac Enzymes: No results for input(s): CKTOTAL, CKMB, CKMBINDEX, TROPONINI in the last 168 hours.  BNP (last 3 results) No results for input(s): PROBNP in the last 8760 hours.  Lipid Profile: No results  for input(s): CHOL, HDL, LDLCALC, TRIG, CHOLHDL, LDLDIRECT in the last 72 hours.  Thyroid Function Tests: No results for input(s): TSH, T4TOTAL, FREET4, T3FREE, THYROIDAB in the last 72 hours.  Anemia Panel: Recent Labs    11/11/20 0403  VITAMINB12 3,505*  FOLATE 92.4  FERRITIN 8*  TIBC 525*  IRON 37*  RETICCTPCT 1.5     Urine analysis:    Component Value Date/Time   COLORURINE YELLOW 11/12/2020 0750   APPEARANCEUR CLEAR 11/12/2020 0750   LABSPEC 1.018 11/12/2020 0750   PHURINE 6.0 11/12/2020 0750   GLUCOSEU NEGATIVE 11/12/2020 0750   HGBUR SMALL (A) 11/12/2020 0750   BILIRUBINUR NEGATIVE 11/12/2020 0750   KETONESUR 20 (A) 11/12/2020 0750   PROTEINUR 30 (A) 11/12/2020 0750   UROBILINOGEN 0.2 12/04/2013 0319   NITRITE NEGATIVE 11/12/2020 0750   LEUKOCYTESUR NEGATIVE 11/12/2020 0750    Sepsis Labs: Lactic Acid, Venous No results found for: LATICACIDVEN  MICROBIOLOGY: Recent Results (from the past 240 hour(s))  Resp Panel by RT-PCR (Flu A&B, Covid) Nasopharyngeal Swab     Status:  None   Collection Time: 11/10/20  9:24 AM   Specimen: Nasopharyngeal Swab; Nasopharyngeal(NP) swabs in vial transport medium  Result Value Ref Range Status   SARS Coronavirus 2 by RT PCR NEGATIVE NEGATIVE Final    Comment: (NOTE) SARS-CoV-2 target nucleic acids are NOT DETECTED.  The SARS-CoV-2 RNA is generally detectable in upper respiratory specimens during the acute phase of infection. The lowest concentration of SARS-CoV-2 viral copies this assay can detect is 138 copies/mL. A negative result does not preclude SARS-Cov-2 infection and should not be used as the sole basis for treatment or other patient management decisions. A negative result may occur with  improper specimen collection/handling, submission of specimen other than nasopharyngeal swab, presence of viral mutation(s) within the areas targeted by this assay, and inadequate number of viral copies(<138 copies/mL). A negative result must be combined with clinical observations, patient history, and epidemiological information. The expected result is Negative.  Fact Sheet for Patients:  EntrepreneurPulse.com.au  Fact Sheet for Healthcare Providers:  IncredibleEmployment.be  This test is no t yet approved or cleared by the [place] FDA and  has been authorized for detection and/or diagnosis of SARS-CoV-2 by FDA under an Emergency Use Authorization (EUA). This EUA will remain  in effect (meaning this test can be used) for the duration of the COVID-19 declaration under Section 564(b)(1) of the Act, 21 U.S.C.section 360bbb-3(b)(1), unless the authorization is terminated  or revoked sooner.       Influenza A by PCR NEGATIVE NEGATIVE Final   Influenza B by PCR NEGATIVE NEGATIVE Final    Comment: (NOTE) The Xpert Xpress SARS-CoV-2/FLU/RSV plus assay is intended as an aid in the diagnosis of influenza from Nasopharyngeal swab specimens and should not be used as a sole basis for  treatment. Nasal washings and aspirates are unacceptable for Xpert Xpress SARS-CoV-2/FLU/RSV testing.  Fact Sheet for Patients: EntrepreneurPulse.com.au  Fact Sheet for Healthcare Providers: IncredibleEmployment.be  This test is not yet approved or cleared by the [place] FDA and has been authorized for detection and/or diagnosis of SARS-CoV-2 by FDA under an Emergency Use Authorization (EUA). This EUA will remain in effect (meaning this test can be used) for the duration of the COVID-19 declaration under Section 564(b)(1) of the Act, 21 U.S.C. section 360bbb-3(b)(1), unless the authorization is terminated or revoked.  Performed at [facility], [place] [address], [place], [place] [address]   Group A Strep by  PCR     Status: None   Collection Time: 11/11/20  4:30 PM   Specimen: Throat; Sterile Swab  Result Value Ref Range Status   Group A Strep by PCR NOT DETECTED NOT DETECTED Final    Comment: Performed at [facility], [address], [place],  [address]  Respiratory (~20 pathogens) panel by PCR     Status: None   Collection Time: 11/11/20  4:30 PM   Specimen: Nasopharyngeal Swab; Respiratory  Result Value Ref Range Status   Adenovirus NOT DETECTED NOT DETECTED Final   Coronavirus 229E NOT DETECTED NOT DETECTED Final    Comment: (NOTE) The Coronavirus on the Respiratory Panel, DOES NOT test for the novel  Coronavirus (2019 nCoV)    Coronavirus HKU1 NOT DETECTED NOT DETECTED Final   Coronavirus NL63 NOT DETECTED NOT DETECTED Final   Coronavirus OC43 NOT DETECTED NOT DETECTED Final   Metapneumovirus NOT DETECTED NOT DETECTED Final   Rhinovirus / Enterovirus NOT DETECTED NOT DETECTED Final   Influenza A NOT DETECTED NOT DETECTED Final   Influenza B NOT DETECTED NOT DETECTED Final   Parainfluenza Virus 1 NOT DETECTED NOT DETECTED Final   Parainfluenza Virus 2 NOT DETECTED NOT DETECTED Final   Parainfluenza Virus 3 NOT  DETECTED NOT DETECTED Final   Parainfluenza Virus 4 NOT DETECTED NOT DETECTED Final   Respiratory Syncytial Virus NOT DETECTED NOT DETECTED Final   Bordetella pertussis NOT DETECTED NOT DETECTED Final   Bordetella Parapertussis NOT DETECTED NOT DETECTED Final   Chlamydophila pneumoniae NOT DETECTED NOT DETECTED Final   Mycoplasma pneumoniae NOT DETECTED NOT DETECTED Final    Comment: Performed at [facility], [place]. [address] [place],  [address]    RADIOLOGY STUDIES/RESULTS: ECHOCARDIOGRAM COMPLETE  Result Date: 11/12/2020    ECHOCARDIOGRAM REPORT   Patient Name:   Frederick Kirby Date of Exam: 11/12/2020 Medical Rec #:  [id number]     Height:       69.0 in Accession #:    [id number]    Weight:       192.5 lb Date of Birth:  Dec 30, 1935     BSA:          2.033 m Patient Age:    40 years      BP:           132/45 mmHg Patient Gender: M             HR:           68 bpm. Exam Location:  Inpatient Procedure: 2D Echo, Cardiac Doppler and Color Doppler Indications:    Abnormal EKG  History:        Patient has prior history of Echocardiogram examinations, most                 recent 10/30/2008. CAD, Prior CABG and Abnormal ECG;                 Signs/Symptoms:Shortness of Breath.  Sonographer:    Merrie Roof RDCS Referring Phys: [place]  1. Left ventricular ejection fraction, by estimation, is 60 to 65%. The left ventricle has normal function. The left ventricle has no regional wall motion abnormalities. There is mild left ventricular hypertrophy of the basal-septal segment. Left ventricular diastolic function could not be evaluated.  2. Right ventricular systolic function is normal. The right ventricular size is normal.  3. The mitral valve is degenerative. Trivial mitral valve regurgitation. No evidence of  mitral stenosis.  4. The aortic valve is calcified. There is moderate calcification of the aortic valve. There is moderate thickening of the aortic valve. Aortic valve  regurgitation is not visualized. Mild to moderate aortic valve stenosis. Aortic valve area, by VTI measures 1.16 cm. Aortic valve mean gradient measures 16.0 mmHg. Aortic valve Vmax measures 2.55 m/s. DI is 0.37.  5. Left atrial size was mild to moderately dilated. FINDINGS  Left Ventricle: Left ventricular ejection fraction, by estimation, is 60 to 65%. The left ventricle has normal function. The left ventricle has no regional wall motion abnormalities. The left ventricular internal cavity size was normal in size. There is  mild left ventricular hypertrophy of the basal-septal segment. Left ventricular diastolic function could not be evaluated. Right Ventricle: The right ventricular size is normal. No increase in right ventricular wall thickness. Right ventricular systolic function is normal. Left Atrium: Left atrial size was mild to moderately dilated. Right Atrium: Right atrial size was normal in size. Pericardium: There is no evidence of pericardial effusion. Mitral Valve: The mitral valve is degenerative in appearance. There is moderate thickening of the mitral valve leaflet(s). There is mild calcification of the anterior mitral valve leaflet(s). Mild mitral annular calcification. Trivial mitral valve regurgitation. No evidence of mitral valve stenosis. Tricuspid Valve: The tricuspid valve is normal in structure. Tricuspid valve regurgitation is mild . No evidence of tricuspid stenosis. Aortic Valve: The aortic valve is calcified. There is moderate calcification of the aortic valve. There is moderate thickening of the aortic valve. Aortic valve regurgitation is not visualized. Mild to moderate aortic stenosis is present. Aortic valve mean gradient measures 16.0 mmHg. Aortic valve peak gradient measures 26.0 mmHg. Aortic valve area, by VTI measures 1.16 cm. Pulmonic Valve: The pulmonic valve was normal in structure. Pulmonic valve regurgitation is not visualized. No evidence of pulmonic stenosis. Aorta: The  aortic root is normal in size and structure. Venous: The inferior vena cava was not well visualized. IAS/Shunts: The interatrial septum appears to be lipomatous. No atrial level shunt detected by color flow Doppler.  LEFT VENTRICLE PLAX 2D LVIDd:         5.00 cm LVIDs:         3.20 cm LV PW:         1.10 cm LV IVS:        1.40 cm LVOT diam:     2.00 cm LV SV:         66 LV SV Index:   33 LVOT Area:     3.14 cm  RIGHT VENTRICLE RV Basal diam:  3.90 cm LEFT ATRIUM             Index        RIGHT ATRIUM           Index LA diam:        6.00 cm 2.95 cm/m   RA Area:     14.10 cm LA Vol (A2C):   73.0 ml 35.92 ml/m  RA Volume:   32.00 ml  15.74 ml/m LA Vol (A4C):   91.3 ml 44.92 ml/m LA Biplane Vol: 84.4 ml 41.52 ml/m  AORTIC VALVE AV Area (Vmax):    1.45 cm AV Area (Vmean):   1.26 cm AV Area (VTI):     1.16 cm AV Vmax:           255.00 cm/s AV Vmean:          190.000 cm/s AV VTI:  0.573 m AV Peak Grad:      26.0 mmHg AV Mean Grad:      16.0 mmHg LVOT Vmax:         118.00 cm/s LVOT Vmean:        76.000 cm/s LVOT VTI:          0.211 m LVOT/AV VTI ratio: 0.37  AORTA Ao Root diam: 3.30 cm Ao Asc diam:  3.20 cm TRICUSPID VALVE TR Peak grad:   38.9 mmHg TR Vmax:        312.00 cm/s  SHUNTS Systemic VTI:  0.21 m Systemic Diam: 2.00 cm Fransico Him MD Electronically signed by Fransico Him MD Signature Date/Time: 11/12/2020/2:31:55 PM    Final      LOS: 2 days   Oren Binet, MD  [facility]    To contact the attending provider between 7A-7P or the covering provider during after hours 7P-7A, please log into the web site [email or web address] and access using universal [place] password for that web site. If you do not have the password, please call the hospital operator.  11/13/2020, 2:27 PM

## 2020-11-14 ENCOUNTER — Inpatient Hospital Stay (HOSPITAL_COMMUNITY): Payer: PPO

## 2020-11-14 DIAGNOSIS — I2510 Atherosclerotic heart disease of native coronary artery without angina pectoris: Secondary | ICD-10-CM

## 2020-11-14 DIAGNOSIS — I10 Essential (primary) hypertension: Secondary | ICD-10-CM

## 2020-11-14 LAB — COMPREHENSIVE METABOLIC PANEL
ALT: 246 U/L — ABNORMAL HIGH (ref 0–44)
AST: 57 U/L — ABNORMAL HIGH (ref 15–41)
Albumin: 3.2 g/dL — ABNORMAL LOW (ref 3.5–5.0)
Alkaline Phosphatase: 70 U/L (ref 38–126)
Anion gap: 9 (ref 5–15)
BUN: 27 mg/dL — ABNORMAL HIGH (ref 8–23)
CO2: 22 mmol/L (ref 22–32)
Calcium: 8.6 mg/dL — ABNORMAL LOW (ref 8.9–10.3)
Chloride: 102 mmol/L (ref 98–111)
Creatinine, Ser: 1.55 mg/dL — ABNORMAL HIGH (ref 0.61–1.24)
GFR, Estimated: 44 mL/min — ABNORMAL LOW (ref >60)
Glucose, Bld: 101 mg/dL — ABNORMAL HIGH (ref 70–99)
Potassium: 4.4 mmol/L (ref 3.5–5.1)
Sodium: 133 mmol/L — ABNORMAL LOW (ref 135–145)
Total Bilirubin: 1 mg/dL (ref 0.3–1.2)
Total Protein: 6.2 g/dL — ABNORMAL LOW (ref 6.5–8.1)

## 2020-11-14 LAB — BRAIN NATRIURETIC PEPTIDE: B Natriuretic Peptide: 523.3 pg/mL — ABNORMAL HIGH (ref 0.0–100.0)

## 2020-11-14 LAB — CBC
HCT: 31 % — ABNORMAL LOW (ref 39.0–52.0)
Hemoglobin: 9.4 g/dL — ABNORMAL LOW (ref 13.0–17.0)
MCH: 22.1 pg — ABNORMAL LOW (ref 26.0–34.0)
MCHC: 30.3 g/dL (ref 30.0–36.0)
MCV: 72.9 fL — ABNORMAL LOW (ref 80.0–100.0)
Platelets: 252 10*3/uL (ref 150–400)
RBC: 4.25 MIL/uL (ref 4.22–5.81)
RDW: 23.6 % — ABNORMAL HIGH (ref 11.5–15.5)
WBC: 7.8 10*3/uL (ref 4.0–10.5)
nRBC: 0 % (ref 0.0–0.2)

## 2020-11-14 LAB — RSV(RESPIRATORY SYNCYTIAL VIRUS) AB, BLOOD: RSV Ab: 1:8 {titer} — ABNORMAL HIGH

## 2020-11-14 NOTE — Discharge Summary (Signed)
PATIENT DETAILS Name: Frederick Kirby Age: 85 y.o. Sex: male Date of Birth: 30-Mar-1935 MRN: [id number]. Admitting Physician: Jonetta Osgood, MD DXI:[id number], [name], MD  Admit Date: 11/10/2020 Discharge date: 11/14/2020  Recommendations for Outpatient Follow-up:  Follow up with PCP in 1-2 weeks Please obtain CMP/CBC in one week Resume statins once LFTs improves further.   Admitted From:  Home   Disposition: Home with home health services   [place]: Yes  Equipment/Devices: None  Discharge Condition: Stable  CODE STATUS: FULL CODE  Diet recommendation:  Diet Order             Diet - low sodium heart healthy           Diet Heart Room service appropriate? Yes; Fluid consistency: Thin  Diet effective now                    Brief Summary: Patient is a 85 y.o. male with history of CAD s/p CABG/PCI, HTN, CKD stage III, prior CVA, COPD who presented with cough/shortness of breath/hoarseness of voice/fatigue-for 2 days (having URI symptoms for 7 days)-found to have worsening anemia, AKI and transaminitis.  Subsequently admitted to the hospitalist service.  Pertinent Labs/Radiology: 10/29>> COVID/influenza PCR: Negative 10/29>> acute hepatitis serology negative. 10/30>> respiratory virus panel: Negative 10/30>> CMV NLZ:JQBHALPF 10/30>> EBV IgM: Negative 10/30>> EBV IgG: Elevated   10/29>> RUQ ultrasound: Cholelithiasis/sludge within gallbladder wall thickening. 10/30>> CT soft tissue neck: No acute findings to explain hoarseness of voice.   10/30>> CT abdomen/pelvis: No hepatic lesion-biliary ductal dilatation 10/30>>HIDA scan:neg for cystic duct obstruction 10/31>> Echo: EF 60-65%    Brief Hospital Course: Hoarseness of voice/sore throat cough: Likely viral in etiology-strep throat/respiratory virus panel/influenza negative.  CT soft tissue neck without any major abnormalities.  Hoarseness of voice getting better-supportive care.  Extensive  viral serologies were negative.  See above.   Symptomatic anemia-microcytic-iron deficient: Suspect recent blood loss-although FOBT negative.  Transfused 2 units of PRBC-hemoglobin now stable.  GI recommending outpatient endoscopic evaluation.  Continue PPI.   Transaminitis: Unclear etiology-concern that this may be viral etiology given that he had hoarseness/sore throat.  Acute hepatitis serology negative. CMV/EBV serology pending.  Echo with stable EF-unlikely to be congestive hepatitis.  Discussed with Dr. Williams Che MD-LFTs now improving-we both feel like an MRCP is not needed at this point.  Have asked patient's wife/patient to ensure that he gets a repeat chemistry panel in the next 1-2 weeks to ensure LFTs continue to downtrend.   AKI on CKD stage IIIb: Suspect AKI is hemodynamically mediated-probably due to acute viral illness/prodrome-possibly from recent blood loss.  No hydronephrosis evident on CT abdomen-UA without proteinuria.  Creatinine improved with just supportive care-creatinine now close to baseline.   CAD-s/p remote CABG/PCI: No chest pain-shortness of breath/fatigue is from symptomatic anemia.  Since no overt blood loss-suspect okay to resume Plavix.   HTN: BP stable-continue amlodipine and metoprolol.   HLD: Continue to hold statin until transaminitis improves.   History of CVA   BMI: Estimated body mass index is 28.42 kg/m as calculated from the following:   Height as of this encounter: 5\' 9"  (1.753 m).   Weight as of this encounter: 87.3 kg.   Procedures None  Discharge Diagnoses:  Active Problems:   CAD (coronary artery disease)   HTN (hypertension)   Symptomatic anemia   ARF (acute renal failure) (HCC)   Elevated LFTs   Hoarseness   Discharge Instructions:  Activity:  As tolerated  with Full fall precautions use walker/cane & assistance as needed  Discharge Instructions     Call MD for:  difficulty breathing, headache or visual disturbances    Complete by: As directed    Call MD for:  persistant nausea and vomiting   Complete by: As directed    Call MD for:  severe uncontrolled pain   Complete by: As directed    Diet - low sodium heart healthy   Complete by: As directed    Discharge instructions   Complete by: As directed    Follow with Primary MD  [name], MD in 1-2 weeks  Please get a complete blood count and chemistry panel checked by your Primary MD at your next visit, and again as instructed by your Primary MD.  Get Medicines reviewed and adjusted: Please take all your medications with you for your next visit with your Primary MD  Laboratory/radiological data: Please request your Primary MD to go over all hospital tests and procedure/radiological results at the follow up, please ask your Primary MD to get all Hospital records sent to his/her office.  In some cases, they will be blood work, cultures and biopsy results pending at the time of your discharge. Please request that your primary care M.D. follows up on these results.  Also Note the following: If you experience worsening of your admission symptoms, develop shortness of breath, life threatening emergency, suicidal or homicidal thoughts you must seek medical attention immediately by calling 911 or calling your MD immediately  if symptoms less severe.  You must read complete instructions/literature along with all the possible adverse reactions/side effects for all the Medicines you take and that have been prescribed to you. Take any new Medicines after you have completely understood and accpet all the possible adverse reactions/side effects.   Do not drive when taking Pain medications or sleeping medications (Benzodaizepines)  Do not take more than prescribed Pain, Sleep and Anxiety Medications. It is not advisable to combine anxiety,sleep and pain medications without talking with your primary care practitioner  Special Instructions: If you have  smoked or chewed Tobacco  in the last 2 yrs please stop smoking, stop any regular Alcohol  and or any Recreational drug use.  Wear Seat belts while driving.  Please note: You were cared for by a hospitalist during your hospital stay. Once you are discharged, your primary care physician will handle any further medical issues. Please note that NO REFILLS for any discharge medications will be authorized once you are discharged, as it is imperative that you return to your primary care physician (or establish a relationship with a primary care physician if you do not have one) for your post hospital discharge needs so that they can reassess your need for medications and monitor your lab values.   1.  Please repeat chemistry panel including LFTs in 1-2 weeks to ensure continued improvement.  2.  Lisinopril on hold-talk to your primary care doctor before resuming.  3.  Crestor on hold due to elevated liver function enzymes-please talk to your primary care doctor before resuming.   Increase activity slowly   Complete by: As directed       Allergies as of 11/14/2020       Reactions   Niaspan [niacin Er]    Turned red & his skin burned        Medication List     STOP taking these medications    lisinopril 2.5 MG tablet Commonly known as: ZESTRIL  rosuvastatin 40 MG tablet Commonly known as: CRESTOR       TAKE these medications    amLODipine 10 MG tablet Commonly known as: NORVASC Take 1 tablet (10 mg total) by mouth daily.   clopidogrel 75 MG tablet Commonly known as: PLAVIX Take 1 tablet (75 mg total) by mouth daily.   Dextromethorphan-guaiFENesin 5-100 MG/5ML Liqd Take 5 mLs by mouth daily as needed (cough).   diphenhydramine-acetaminophen 25-500 MG Tabs tablet Commonly known as: TYLENOL PM Take 1 tablet by mouth at bedtime as needed (pain).   Fish Oil 1200 MG Caps Take 1 capsule (1,200 mg total) by mouth 2 (two) times daily.   Folbic 2.5-25-2 MG Tabs  tablet Generic drug: folic acid-pyridoxine-cyancobalamin Take 1 tablet by mouth daily.   magnesium hydroxide 400 MG/5ML suspension Commonly known as: MILK OF MAGNESIA Take 15 mLs by mouth See admin instructions. Every 4 days   metoprolol succinate 25 MG 24 hr tablet Commonly known as: TOPROL-XL Take 1 tablet (25 mg total) by mouth daily.   pantoprazole 40 MG tablet Commonly known as: PROTONIX Take 1 tablet (40 mg total) by mouth daily.        Follow-up Information     [facility] [place] Follow up in 2 month(s).   Why: Anemia, abnormal LFTs, on Plavix Contact information: [place] [address] [phone]         [name], MD. Schedule an appointment as soon as possible for a visit in 1 week(s).   Specialty: Family Medicine Contact information: [place] [address] [phone]         [name], MD. Schedule an appointment as soon as possible for a visit in 1 month(s).   Specialty: Cardiology Contact information: [address] [place] [address] [phone]                Allergies  Allergen Reactions   Niaspan Durene Cal Er]     Turned red & his skin burned      Consultations: GI   Other Procedures/Studies: CT ABDOMEN PELVIS WO CONTRAST  Result Date: 11/11/2020 CLINICAL DATA:  Epigastric pain. EXAM: CT ABDOMEN AND PELVIS WITHOUT CONTRAST TECHNIQUE: Multidetector CT imaging of the abdomen and pelvis was performed following the standard protocol without IV contrast. COMPARISON:  Abdominal ultrasound 11/10/2020 FINDINGS: Lower chest: Bilateral moderate pleural effusions with passive atelectasis of the lower lobes. Hepatobiliary: No focal hepatic lesion. No biliary duct dilatation. Common bile duct is normal. Pancreas: Pancreas is normal. No ductal dilatation. No pancreatic inflammation. Spleen: Normal spleen Adrenals/urinary tract: Adrenal glands normal. Renal  cortical thinning on the LEFT. Simple cyst in the upper pole of the LEFT kidney. Ureters and bladder normal. Stomach/Bowel: Stomach, small-bowel and cecum are normal. The appendix is not identified but there is no pericecal inflammation to suggest appendicitis. Multiple diverticula of the descending colon and sigmoid colon without acute inflammation. Vascular/Lymphatic: Abdominal aorta is normal caliber with atherosclerotic calcification. There is no retroperitoneal or periportal lymphadenopathy. No pelvic lymphadenopathy. Reproductive: Nodular prostate gland. Other: No free fluid. Musculoskeletal: Benign-appearing sclerotic lesion at L4. Aggressive osseous lesion. IMPRESSION: 1. Bilateral pleural effusions with passive atelectasis. 2. No acute findings in the abdomen pelvis. 3.  Aortic Atherosclerosis (ICD10-I70.0). Electronically Signed   By: Suzy Bouchard M.D.   On: 11/11/2020 08:52   DG Chest 2 View  Result Date: 11/10/2020 CLINICAL DATA:  Shortness of breath, cough and congestion. EXAM: CHEST - 2 VIEW COMPARISON:  11/23/2010 FINDINGS: The cardiac silhouette, mediastinal hilar contours are within normal limits given the patient's age and AP projection. Stable surgical changes related to bypass surgery. Chronic appearing bronchitic type lung changes but could not exclude superimposed bronchitis. There is a suspected small right pleural effusion with overlying atelectasis. No definite infiltrates. IMPRESSION: 1. Chronic appearing bronchitic type lung changes but could not exclude superimposed bronchitis. 2. Small right pleural effusion with overlying atelectasis. Electronically Signed   By: Marijo Sanes M.D.   On: 11/10/2020 10:28   CT SOFT TISSUE NECK WO CONTRAST  Result Date: 11/11/2020 CLINICAL DATA:  Neck pain. EXAM: CT NECK WITHOUT CONTRAST TECHNIQUE: Multidetector CT imaging of the neck was performed following the standard protocol without intravenous contrast. COMPARISON:  None. FINDINGS:  Pharynx and larynx: No evidence of mass or inflammation Salivary glands: No inflammation, mass, or stone. Thyroid: Normal. Lymph nodes: None enlarged or abnormal density. Vascular: Atheromatous calcification Limited intracranial: No acute finding Visualized orbits: Bilateral cataract resection Mastoids and visualized paranasal sinuses: Clear Skeleton: Ordinary cervical spine degeneration. No acute or aggressive finding Upper chest: Emphysema. Hazy dependent opacity likely reflecting atelectasis. Small layering pleural effusions where visualized. IMPRESSION: 1. No acute finding or explanation for symptoms. 2. Aortic Atherosclerosis (ICD10-I70.0) and Emphysema (ICD10-J43.9). Electronically Signed   By: Jorje Guild M.D.   On: 11/11/2020 08:19   NM Hepatobiliary Liver Func  Result Date: 11/11/2020 CLINICAL DATA:  Abdominal pain, biliary colic. EXAM: NUCLEAR MEDICINE HEPATOBILIARY IMAGING TECHNIQUE: Sequential images of the abdomen were obtained out to 60 minutes following intravenous administration of radiopharmaceutical. RADIOPHARMACEUTICALS:  5.3 mCi Tc-86m  Choletec IV COMPARISON:  November 10, 2020. FINDINGS: Prompt uptake and biliary excretion of activity by the liver is seen. Gallbladder activity is visualized, consistent with patency of cystic duct. Biliary activity passes into small bowel, consistent with patent common bile duct. IMPRESSION: Normal uptake is seen within the gallbladder consistent with patency of the cystic duct. No definite evidence of cholecystitis is noted. Electronically Signed   By: Marijo Conception M.D.   On: 11/11/2020 14:46   DG Chest Port 1 View  Result Date: 11/14/2020 CLINICAL DATA:  Short of breath.  Follow-up exam. EXAM: PORTABLE CHEST 1 VIEW COMPARISON:  11/10/2020. FINDINGS: Hazy and reticular type lung opacities at the bases, stable from prior exam, likely due to a combination of chronic bronchitic change and atelectasis possible small effusions. No convincing pneumonia  or evidence of pulmonary edema. No pneumothorax. IMPRESSION: 1. No significant change from the most recent prior study. No convincing pneumonia or pulmonary edema. 2. Mild persistent lung base opacities likely a combination of chronic changes with atelectasis. Small effusions suspected. Electronically Signed   By: Lajean Manes M.D.   On: 11/14/2020 08:11   ECHOCARDIOGRAM COMPLETE  Result Date: 11/12/2020    ECHOCARDIOGRAM REPORT   Patient Name:   Frederick Kirby Date of Exam: 11/12/2020 Medical Rec #:  [id number]     Height:       69.0 in Accession #:    [id number]    Weight:       192.5 lb Date of Birth:  August 25, 1935     BSA:          2.033 m Patient Age:    71 years      BP:           132/45 mmHg Patient Gender: M             HR:  68 bpm. Exam Location:  Inpatient Procedure: 2D Echo, Cardiac Doppler and Color Doppler Indications:    Abnormal EKG  History:        Patient has prior history of Echocardiogram examinations, most                 recent 10/30/2008. CAD, Prior CABG and Abnormal ECG;                 Signs/Symptoms:Shortness of Breath.  Sonographer:    Merrie Roof RDCS Referring Phys: [place]  1. Left ventricular ejection fraction, by estimation, is 60 to 65%. The left ventricle has normal function. The left ventricle has no regional wall motion abnormalities. There is mild left ventricular hypertrophy of the basal-septal segment. Left ventricular diastolic function could not be evaluated.  2. Right ventricular systolic function is normal. The right ventricular size is normal.  3. The mitral valve is degenerative. Trivial mitral valve regurgitation. No evidence of mitral stenosis.  4. The aortic valve is calcified. There is moderate calcification of the aortic valve. There is moderate thickening of the aortic valve. Aortic valve regurgitation is not visualized. Mild to moderate aortic valve stenosis. Aortic valve area, by VTI measures 1.16 cm. Aortic valve mean  gradient measures 16.0 mmHg. Aortic valve Vmax measures 2.55 m/s. DI is 0.37.  5. Left atrial size was mild to moderately dilated. FINDINGS  Left Ventricle: Left ventricular ejection fraction, by estimation, is 60 to 65%. The left ventricle has normal function. The left ventricle has no regional wall motion abnormalities. The left ventricular internal cavity size was normal in size. There is  mild left ventricular hypertrophy of the basal-septal segment. Left ventricular diastolic function could not be evaluated. Right Ventricle: The right ventricular size is normal. No increase in right ventricular wall thickness. Right ventricular systolic function is normal. Left Atrium: Left atrial size was mild to moderately dilated. Right Atrium: Right atrial size was normal in size. Pericardium: There is no evidence of pericardial effusion. Mitral Valve: The mitral valve is degenerative in appearance. There is moderate thickening of the mitral valve leaflet(s). There is mild calcification of the anterior mitral valve leaflet(s). Mild mitral annular calcification. Trivial mitral valve regurgitation. No evidence of mitral valve stenosis. Tricuspid Valve: The tricuspid valve is normal in structure. Tricuspid valve regurgitation is mild . No evidence of tricuspid stenosis. Aortic Valve: The aortic valve is calcified. There is moderate calcification of the aortic valve. There is moderate thickening of the aortic valve. Aortic valve regurgitation is not visualized. Mild to moderate aortic stenosis is present. Aortic valve mean gradient measures 16.0 mmHg. Aortic valve peak gradient measures 26.0 mmHg. Aortic valve area, by VTI measures 1.16 cm. Pulmonic Valve: The pulmonic valve was normal in structure. Pulmonic valve regurgitation is not visualized. No evidence of pulmonic stenosis. Aorta: The aortic root is normal in size and structure. Venous: The inferior vena cava was not well visualized. IAS/Shunts: The interatrial septum  appears to be lipomatous. No atrial level shunt detected by color flow Doppler.  LEFT VENTRICLE PLAX 2D LVIDd:         5.00 cm LVIDs:         3.20 cm LV PW:         1.10 cm LV IVS:        1.40 cm LVOT diam:     2.00 cm LV SV:         66 LV SV Index:   33 LVOT Area:  3.14 cm  RIGHT VENTRICLE RV Basal diam:  3.90 cm LEFT ATRIUM             Index        RIGHT ATRIUM           Index LA diam:        6.00 cm 2.95 cm/m   RA Area:     14.10 cm LA Vol (A2C):   73.0 ml 35.92 ml/m  RA Volume:   32.00 ml  15.74 ml/m LA Vol (A4C):   91.3 ml 44.92 ml/m LA Biplane Vol: 84.4 ml 41.52 ml/m  AORTIC VALVE AV Area (Vmax):    1.45 cm AV Area (Vmean):   1.26 cm AV Area (VTI):     1.16 cm AV Vmax:           255.00 cm/s AV Vmean:          190.000 cm/s AV VTI:            0.573 m AV Peak Grad:      26.0 mmHg AV Mean Grad:      16.0 mmHg LVOT Vmax:         118.00 cm/s LVOT Vmean:        76.000 cm/s LVOT VTI:          0.211 m LVOT/AV VTI ratio: 0.37  AORTA Ao Root diam: 3.30 cm Ao Asc diam:  3.20 cm TRICUSPID VALVE TR Peak grad:   38.9 mmHg TR Vmax:        312.00 cm/s  SHUNTS Systemic VTI:  0.21 m Systemic Diam: 2.00 cm Fransico Him MD Electronically signed by Fransico Him MD Signature Date/Time: 11/12/2020/2:31:55 PM    Final    US Abdomen Limited RUQ (LIVER/GB)  Result Date: 11/10/2020 CLINICAL DATA:  Elevated liver function test. EXAM: ULTRASOUND ABDOMEN LIMITED RIGHT UPPER QUADRANT COMPARISON:  None. FINDINGS: Gallbladder: Shadowing echogenic gallstones are seen within the gallbladder lumen. Echogenic sludge is also noted. The gallbladder wall measures 4.0 mm in thickness. No sonographic Murphy sign noted by sonographer. Common bile duct: Diameter: 3.4 mm Liver: No focal lesion identified. Within normal limits in parenchymal echogenicity. Portal vein is patent on color Doppler imaging with normal direction of blood flow towards the liver. Other: Of incidental note is the presence of a right-sided pleural effusion.  IMPRESSION: Cholelithiasis and gallbladder sludge with gallbladder wall thickening. Acute cholecystitis cannot completely be excluded. Further evaluation with a nuclear medicine hepatobiliary scan is recommended. Electronically Signed   By: Virgina Norfolk M.D.   On: 11/10/2020 21:02     TODAY-DAY OF DISCHARGE:  Subjective:   Taegen Delker today has no headache,no chest abdominal pain,no new weakness tingling or numbness, feels much better wants to go home today.   Objective:   Blood pressure (!) 129/57, pulse 79, temperature 97.9 F (36.6 C), temperature source Oral, resp. rate (!) 21, height 5\' 9"  (1.753 m), weight 87.3 kg, SpO2 95 %.  Intake/Output Summary (Last 24 hours) at 11/14/2020 1105 Last data filed at 11/14/2020 1007 Gross per 24 hour  Intake 150 ml  Output 2170 ml  Net -2020 ml   Filed Weights   11/10/20 2048 11/11/20 0143  Weight: 85.7 kg 87.3 kg    Exam: Awake Alert, Oriented *3, No new F.N deficits, Normal affect [place].AT,PERRAL Supple Neck,No JVD, No cervical lymphadenopathy appriciated.  Symmetrical Chest wall movement, Good air movement bilaterally, CTAB RRR,No Gallops,Rubs or new Murmurs, No Parasternal Heave +ve B.Sounds, Abd Soft, Non tender,  No organomegaly appriciated, No rebound -guarding or rigidity. No Cyanosis, Clubbing or edema, No new Rash or bruise   PERTINENT RADIOLOGIC STUDIES: DG Chest Port 1 View  Result Date: 11/14/2020 CLINICAL DATA:  Short of breath.  Follow-up exam. EXAM: PORTABLE CHEST 1 VIEW COMPARISON:  11/10/2020. FINDINGS: Hazy and reticular type lung opacities at the bases, stable from prior exam, likely due to a combination of chronic bronchitic change and atelectasis possible small effusions. No convincing pneumonia or evidence of pulmonary edema. No pneumothorax. IMPRESSION: 1. No significant change from the most recent prior study. No convincing pneumonia or pulmonary edema. 2. Mild persistent lung base opacities likely a combination  of chronic changes with atelectasis. Small effusions suspected. Electronically Signed   By: Lajean Manes M.D.   On: 11/14/2020 08:11   ECHOCARDIOGRAM COMPLETE  Result Date: 11/12/2020    ECHOCARDIOGRAM REPORT   Patient Name:   Frederick Kirby Date of Exam: 11/12/2020 Medical Rec #:  [id number]     Height:       69.0 in Accession #:    [id number]    Weight:       192.5 lb Date of Birth:  1935/09/03     BSA:          2.033 m Patient Age:    7 years      BP:           132/45 mmHg Patient Gender: M             HR:           68 bpm. Exam Location:  Inpatient Procedure: 2D Echo, Cardiac Doppler and Color Doppler Indications:    Abnormal EKG  History:        Patient has prior history of Echocardiogram examinations, most                 recent 10/30/2008. CAD, Prior CABG and Abnormal ECG;                 Signs/Symptoms:Shortness of Breath.  Sonographer:    Merrie Roof RDCS Referring Phys: [place]  1. Left ventricular ejection fraction, by estimation, is 60 to 65%. The left ventricle has normal function. The left ventricle has no regional wall motion abnormalities. There is mild left ventricular hypertrophy of the basal-septal segment. Left ventricular diastolic function could not be evaluated.  2. Right ventricular systolic function is normal. The right ventricular size is normal.  3. The mitral valve is degenerative. Trivial mitral valve regurgitation. No evidence of mitral stenosis.  4. The aortic valve is calcified. There is moderate calcification of the aortic valve. There is moderate thickening of the aortic valve. Aortic valve regurgitation is not visualized. Mild to moderate aortic valve stenosis. Aortic valve area, by VTI measures 1.16 cm. Aortic valve mean gradient measures 16.0 mmHg. Aortic valve Vmax measures 2.55 m/s. DI is 0.37.  5. Left atrial size was mild to moderately dilated. FINDINGS  Left Ventricle: Left ventricular ejection fraction, by estimation, is 60 to 65%. The left  ventricle has normal function. The left ventricle has no regional wall motion abnormalities. The left ventricular internal cavity size was normal in size. There is  mild left ventricular hypertrophy of the basal-septal segment. Left ventricular diastolic function could not be evaluated. Right Ventricle: The right ventricular size is normal. No increase in right ventricular wall thickness. Right ventricular systolic function is normal. Left Atrium: Left atrial size was mild to moderately dilated. Right  Atrium: Right atrial size was normal in size. Pericardium: There is no evidence of pericardial effusion. Mitral Valve: The mitral valve is degenerative in appearance. There is moderate thickening of the mitral valve leaflet(s). There is mild calcification of the anterior mitral valve leaflet(s). Mild mitral annular calcification. Trivial mitral valve regurgitation. No evidence of mitral valve stenosis. Tricuspid Valve: The tricuspid valve is normal in structure. Tricuspid valve regurgitation is mild . No evidence of tricuspid stenosis. Aortic Valve: The aortic valve is calcified. There is moderate calcification of the aortic valve. There is moderate thickening of the aortic valve. Aortic valve regurgitation is not visualized. Mild to moderate aortic stenosis is present. Aortic valve mean gradient measures 16.0 mmHg. Aortic valve peak gradient measures 26.0 mmHg. Aortic valve area, by VTI measures 1.16 cm. Pulmonic Valve: The pulmonic valve was normal in structure. Pulmonic valve regurgitation is not visualized. No evidence of pulmonic stenosis. Aorta: The aortic root is normal in size and structure. Venous: The inferior vena cava was not well visualized. IAS/Shunts: The interatrial septum appears to be lipomatous. No atrial level shunt detected by color flow Doppler.  LEFT VENTRICLE PLAX 2D LVIDd:         5.00 cm LVIDs:         3.20 cm LV PW:         1.10 cm LV IVS:        1.40 cm LVOT diam:     2.00 cm LV SV:          66 LV SV Index:   33 LVOT Area:     3.14 cm  RIGHT VENTRICLE RV Basal diam:  3.90 cm LEFT ATRIUM             Index        RIGHT ATRIUM           Index LA diam:        6.00 cm 2.95 cm/m   RA Area:     14.10 cm LA Vol (A2C):   73.0 ml 35.92 ml/m  RA Volume:   32.00 ml  15.74 ml/m LA Vol (A4C):   91.3 ml 44.92 ml/m LA Biplane Vol: 84.4 ml 41.52 ml/m  AORTIC VALVE AV Area (Vmax):    1.45 cm AV Area (Vmean):   1.26 cm AV Area (VTI):     1.16 cm AV Vmax:           255.00 cm/s AV Vmean:          190.000 cm/s AV VTI:            0.573 m AV Peak Grad:      26.0 mmHg AV Mean Grad:      16.0 mmHg LVOT Vmax:         118.00 cm/s LVOT Vmean:        76.000 cm/s LVOT VTI:          0.211 m LVOT/AV VTI ratio: 0.37  AORTA Ao Root diam: 3.30 cm Ao Asc diam:  3.20 cm TRICUSPID VALVE TR Peak grad:   38.9 mmHg TR Vmax:        312.00 cm/s  SHUNTS Systemic VTI:  0.21 m Systemic Diam: 2.00 cm Fransico Him MD Electronically signed by Fransico Him MD Signature Date/Time: 11/12/2020/2:31:55 PM    Final      PERTINENT LAB RESULTS: CBC: Recent Labs    11/13/20 0930 11/14/20 0322  WBC 8.5 7.8  HGB 9.4* 9.4*  HCT 30.3* 31.0*  PLT  242 252   CMET CMP     Component Value Date/Time   NA 133 (L) 11/14/2020 0322   NA 139 03/15/2020 0919   K 4.4 11/14/2020 0322   CL 102 11/14/2020 0322   CO2 22 11/14/2020 0322   GLUCOSE 101 (H) 11/14/2020 0322   BUN 27 (H) 11/14/2020 0322   BUN 18 03/15/2020 0919   CREATININE 1.55 (H) 11/14/2020 0322   CREATININE 1.66 (H) 12/24/2015 1029   CALCIUM 8.6 (L) 11/14/2020 0322   PROT 6.2 (L) 11/14/2020 0322   PROT 7.3 03/15/2020 0919   ALBUMIN 3.2 (L) 11/14/2020 0322   ALBUMIN 4.7 (H) 03/15/2020 0919   AST 57 (H) 11/14/2020 0322   ALT 246 (H) 11/14/2020 0322   ALKPHOS 70 11/14/2020 0322   BILITOT 1.0 11/14/2020 0322   BILITOT 0.3 03/15/2020 0919   GFRNONAA 44 (L) 11/14/2020 0322   GFRAA 51 (L) 12/04/2013 0331    GFR Estimated Creatinine Clearance: 39.5 mL/min (A) (by C-G  formula based on SCr of 1.55 mg/dL (H)). No results for input(s): LIPASE, AMYLASE in the last 72 hours. No results for input(s): CKTOTAL, CKMB, CKMBINDEX, TROPONINI in the last 72 hours. Invalid input(s): POCBNP No results for input(s): DDIMER in the last 72 hours. No results for input(s): HGBA1C in the last 72 hours. No results for input(s): CHOL, HDL, LDLCALC, TRIG, CHOLHDL, LDLDIRECT in the last 72 hours. No results for input(s): TSH, T4TOTAL, T3FREE, THYROIDAB in the last 72 hours.  Invalid input(s): FREET3 No results for input(s): VITAMINB12, FOLATE, FERRITIN, TIBC, IRON, RETICCTPCT in the last 72 hours. Coags: No results for input(s): INR in the last 72 hours.  Invalid input(s): PT Microbiology: Recent Results (from the past 240 hour(s))  Resp Panel by RT-PCR (Flu A&B, Covid) Nasopharyngeal Swab     Status: None   Collection Time: 11/10/20  9:24 AM   Specimen: Nasopharyngeal Swab; Nasopharyngeal(NP) swabs in vial transport medium  Result Value Ref Range Status   SARS Coronavirus 2 by RT PCR NEGATIVE NEGATIVE Final    Comment: (NOTE) SARS-CoV-2 target nucleic acids are NOT DETECTED.  The SARS-CoV-2 RNA is generally detectable in upper respiratory specimens during the acute phase of infection. The lowest concentration of SARS-CoV-2 viral copies this assay can detect is 138 copies/mL. A negative result does not preclude SARS-Cov-2 infection and should not be used as the sole basis for treatment or other patient management decisions. A negative result may occur with  improper specimen collection/handling, submission of specimen other than nasopharyngeal swab, presence of viral mutation(s) within the areas targeted by this assay, and inadequate number of viral copies(<138 copies/mL). A negative result must be combined with clinical observations, patient history, and epidemiological information. The expected result is Negative.  Fact Sheet for Patients:   EntrepreneurPulse.com.au  Fact Sheet for Healthcare Providers:  IncredibleEmployment.be  This test is no t yet approved or cleared by the [place] FDA and  has been authorized for detection and/or diagnosis of SARS-CoV-2 by FDA under an Emergency Use Authorization (EUA). This EUA will remain  in effect (meaning this test can be used) for the duration of the COVID-19 declaration under Section 564(b)(1) of the Act, 21 U.S.C.section 360bbb-3(b)(1), unless the authorization is terminated  or revoked sooner.       Influenza A by PCR NEGATIVE NEGATIVE Final   Influenza B by PCR NEGATIVE NEGATIVE Final    Comment: (NOTE) The Xpert Xpress SARS-CoV-2/FLU/RSV plus assay is intended as an aid in the diagnosis of influenza  from Nasopharyngeal swab specimens and should not be used as a sole basis for treatment. Nasal washings and aspirates are unacceptable for Xpert Xpress SARS-CoV-2/FLU/RSV testing.  Fact Sheet for Patients: EntrepreneurPulse.com.au  Fact Sheet for Healthcare Providers: IncredibleEmployment.be  This test is not yet approved or cleared by the [place] FDA and has been authorized for detection and/or diagnosis of SARS-CoV-2 by FDA under an Emergency Use Authorization (EUA). This EUA will remain in effect (meaning this test can be used) for the duration of the COVID-19 declaration under Section 564(b)(1) of the Act, 21 U.S.C. section 360bbb-3(b)(1), unless the authorization is terminated or revoked.  Performed at [facility], [place] [address], [place], [place] [address]   Group A Strep by PCR     Status: None   Collection Time: 11/11/20  4:30 PM   Specimen: Throat; Sterile Swab  Result Value Ref Range Status   Group A Strep by PCR NOT DETECTED NOT DETECTED Final    Comment: Performed at [facility], [place] [address] [place], [place] [address]  Respiratory (~20 pathogens) panel  by PCR     Status: None   Collection Time: 11/11/20  4:30 PM   Specimen: Nasopharyngeal Swab; Respiratory  Result Value Ref Range Status   Adenovirus NOT DETECTED NOT DETECTED Final   Coronavirus 229E NOT DETECTED NOT DETECTED Final    Comment: (NOTE) The Coronavirus on the Respiratory Panel, DOES NOT test for the novel  Coronavirus (2019 nCoV)    Coronavirus HKU1 NOT DETECTED NOT DETECTED Final   Coronavirus NL63 NOT DETECTED NOT DETECTED Final   Coronavirus OC43 NOT DETECTED NOT DETECTED Final   Metapneumovirus NOT DETECTED NOT DETECTED Final   Rhinovirus / Enterovirus NOT DETECTED NOT DETECTED Final   Influenza A NOT DETECTED NOT DETECTED Final   Influenza B NOT DETECTED NOT DETECTED Final   Parainfluenza Virus 1 NOT DETECTED NOT DETECTED Final   Parainfluenza Virus 2 NOT DETECTED NOT DETECTED Final   Parainfluenza Virus 3 NOT DETECTED NOT DETECTED Final   Parainfluenza Virus 4 NOT DETECTED NOT DETECTED Final   Respiratory Syncytial Virus NOT DETECTED NOT DETECTED Final   Bordetella pertussis NOT DETECTED NOT DETECTED Final   Bordetella Parapertussis NOT DETECTED NOT DETECTED Final   Chlamydophila pneumoniae NOT DETECTED NOT DETECTED Final   Mycoplasma pneumoniae NOT DETECTED NOT DETECTED Final    Comment: Performed at [facility], [place]. [address] [place], [place] [address]    FURTHER DISCHARGE INSTRUCTIONS:  Get Medicines reviewed and adjusted: Please take all your medications with you for your next visit with your Primary MD  Laboratory/radiological data: Please request your Primary MD to go over all hospital tests and procedure/radiological results at the follow up, please ask your Primary MD to get all Hospital records sent to his/her office.  In some cases, they will be blood work, cultures and biopsy results pending at the time of your discharge. Please request that your primary care M.D. goes through all the records of your hospital data and follows up on these  results.  Also Note the following: If you experience worsening of your admission symptoms, develop shortness of breath, life threatening emergency, suicidal or homicidal thoughts you must seek medical attention immediately by calling 911 or calling your MD immediately  if symptoms less severe.  You must read complete instructions/literature along with all the possible adverse reactions/side effects for all the Medicines you take and that have been prescribed to you. Take any new  Medicines after you have completely understood and accpet all the possible adverse reactions/side effects.   Do not drive when taking Pain medications or sleeping medications (Benzodaizepines)  Do not take more than prescribed Pain, Sleep and Anxiety Medications. It is not advisable to combine anxiety,sleep and pain medications without talking with your primary care practitioner  Special Instructions: If you have smoked or chewed Tobacco  in the last 2 yrs please stop smoking, stop any regular Alcohol  and or any Recreational drug use.  Wear Seat belts while driving.  Please note: You were cared for by a hospitalist during your hospital stay. Once you are discharged, your primary care physician will handle any further medical issues. Please note that NO REFILLS for any discharge medications will be authorized once you are discharged, as it is imperative that you return to your primary care physician (or establish a relationship with a primary care physician if you do not have one) for your post hospital discharge needs so that they can reassess your need for medications and monitor your lab values.  Total Time spent coordinating discharge including counseling, education and face to face time equals 35 minutes.  SignedOren Binet 11/14/2020 11:05 AM

## 2020-11-14 NOTE — Progress Notes (Signed)
Physical Therapy Treatment Patient Details Name: Frederick Kirby MRN: [id number] DOB: Dec 07, 1935 Today's Date: 11/14/2020   History of Present Illness Patient is a 85 y.o. male who presented 11/10/20 with cough/shortness of breath/hoarseness of voice/fatigue-for 2 days (having URI symptoms for 7 days)-found to have worsening anemia, AKI and transaminitis. PMH: CAD s/p CABG/PCI, HTN, CKD stage III, prior CVA, COPD    PT Comments    Pt has made good progress with mobility being able to ambulate this date up to ~100 ft with a RW and min guard assist. Pt displays some balance deficits, relying on the RW to ambulate, and demonstrating a slight posterior trunk sway when trying to lift the RW to turn. Educated pt to keep RW proximal and on ground, even when turning. Will continue to follow acutely. Current recommendations remain appropriate.    Recommendations for follow up therapy are one component of a multi-disciplinary discharge planning process, led by the attending physician.  Recommendations may be updated based on patient status, additional functional criteria and insurance authorization.  Follow Up Recommendations  Home health PT     Assistance Recommended at Discharge Frequent or constant Supervision/Assistance  Equipment Recommendations  Rolling walker (2 wheels)    Recommendations for Other Services       Precautions / Restrictions Precautions Precautions: Fall Precaution Comments: watch O2, RR Restrictions Weight Bearing Restrictions: No     Mobility  Bed Mobility Overal bed mobility: Needs Assistance Bed Mobility: Supine to Sit;Sit to Supine     Supine to sit: Supervision;HOB elevated Sit to supine: Supervision;HOB elevated   General bed mobility comments: Supervision for safety, HOB elevated.    Transfers Overall transfer level: Needs assistance Equipment used: Rolling walker (2 wheels) Transfers: Sit to/from Stand Sit to Stand: Min guard           General  transfer comment: Cues provided for hand placement to push up from bed with at least 1 UE, min guard assist for safety, no LOB.    Ambulation/Gait Ambulation/Gait assistance: Min guard Gait Distance (Feet): 100 Feet Assistive device: Rolling walker (2 wheels) Gait Pattern/deviations: Step-through pattern;Decreased stride length Gait velocity: reduced Gait velocity interpretation: 1.31 - 2.62 ft/sec, indicative of limited community ambulator General Gait Details: Pt needing cues to remain proximal to his RW, good compliance. Also needing cues to keep RW on ground during turns and to break up the turn as needed vs one quick large turn to ensure safety, minor trunk sway noted with turns. Min guard assist for safety.   Stairs             Wheelchair Mobility    Modified Rankin (Stroke Patients Only)       Balance Overall balance assessment: Needs assistance Sitting-balance support: No upper extremity supported;Feet supported Sitting balance-Leahy Scale: Good     Standing balance support: No upper extremity supported;Bilateral upper extremity supported;During functional activity Standing balance-Leahy Scale: Fair Standing balance comment: Able to stand briefly without UE support but reliant on RW for mobility                            Cognition Arousal/Alertness: Awake/alert Behavior During Therapy: WFL for tasks assessed/performed Overall Cognitive Status: History of cognitive impairments - at baseline                                 General Comments: Pt with moments  of being slow to process cues.        Exercises      General Comments General comments (skin integrity, edema, etc.): Encouraged use of flutter valve and sitting up/walking to improve pulmonary function      Pertinent Vitals/Pain Pain Assessment: Faces Faces Pain Scale: No hurt Pain Intervention(s): Monitored during session    Home Living                           Prior Function            PT Goals (current goals can now be found in the care plan section) Acute Rehab PT Goals Patient Stated Goal: to go home today PT Goal Formulation: With patient Time For Goal Achievement: 11/26/20 Potential to Achieve Goals: Good Progress towards PT goals: Progressing toward goals    Frequency    Min 3X/week      PT Plan Current plan remains appropriate    Co-evaluation              AM-PAC PT "6 Clicks" Mobility   Outcome Measure  Help needed turning from your back to your side while in a flat bed without using bedrails?: A Little Help needed moving from lying on your back to sitting on the side of a flat bed without using bedrails?: A Little Help needed moving to and from a bed to a chair (including a wheelchair)?: A Little Help needed standing up from a chair using your arms (e.g., wheelchair or bedside chair)?: A Little Help needed to walk in hospital room?: A Little Help needed climbing 3-5 steps with a railing? : Total 6 Click Score: 16    End of Session Equipment Utilized During Treatment: Gait belt Activity Tolerance: Patient limited by fatigue Patient left: in bed;with call bell/phone within reach;with bed alarm set Nurse Communication: Mobility status PT Visit Diagnosis: Muscle weakness (generalized) (M62.81);Difficulty in walking, not elsewhere classified (R26.2);Unsteadiness on feet (R26.81)     Time: 7588-3254 PT Time Calculation (min) (ACUTE ONLY): 11 min  Charges:  $Gait Training: 8-22 mins                     Moishe Spice, PT, DPT Acute Rehabilitation Services  Pager: [phone] Office: [place] 11/14/2020, 1:09 PM

## 2020-11-14 NOTE — TOC Initial Note (Signed)
Transition of Care [facility]) - Initial/Assessment Note    Patient Details  Name: Frederick Kirby MRN: [id number] Date of Birth: Aug 04, 1935  [facility] CM/SW Contact:    Bethena Roys, RN Phone Number: 11/14/2020, 11:41 AM  Clinical Narrative:  Case Manager received verbal permission to call his spouse in regards to home health services. Case Manager did discuss home health with the spouse for PT/OT. Spouse is agreeable to services and she does not have an agency choice-just wants it to be in network with the insurance. Case Manager discussed [facility] with the spouse and she is agreeable. Referral sent to [facility] and they can service the patient. Start of care to begin within 24-48 hours of transition home. Case Manager sent referral to [facility] for the rolling walker-will be delivered to the room. Spouse states she works during the day; however, her sister will be in the home with the patient. Spouse will provide transportation home once DME arrives. Staff RN aware to call the spouse once DME arrives. No further needs from Case Manager at this time.                 Expected Discharge Plan: [place] Barriers to Discharge: No Barriers Identified   Patient Goals and CMS Choice Patient states their goals for this hospitalization and ongoing recovery are:: to return home   Choice offered to / list presented to : Spouse (no preference for agency at this time- just in network)  Expected Discharge Plan and Services Expected Discharge Plan: [place]   Discharge Planning Services: CM Consult Post Acute Care Choice: Home Health, Durable Medical Equipment Living arrangements for the past 2 months: Single Family Home Expected Discharge Date: 11/14/20               DME Arranged: Gilford Rile rolling DME Agency: [facility] Date DME Agency Contacted: 11/14/20 Time DME Agency Contacted: 73 Representative spoke with at DME Agency: [facility]  Arranged: PT, OT [place] Agency: [place] Date [place]: 11/14/20 Time [place]: 33 Representative spoke with at [place]: Amy  Prior Living Arrangements/Services Living arrangements for the past 2 months: [place] with:: Spouse, Relatives Patient language and need for interpreter reviewed:: Yes Do you feel safe going back to the place where you live?: Yes      Need for Family Participation in Patient Care: Yes (Comment) Care giver support system in place?: Yes (comment)   Criminal Activity/Legal Involvement Pertinent to Current Situation/Hospitalization: No - Comment as needed  Activities of Daily Living Home Assistive Devices/Equipment: None ADL Screening (condition at time of admission) Patient's cognitive ability adequate to safely complete daily activities?: Yes Is the patient deaf or have difficulty hearing?: No Does the patient have difficulty seeing, even when wearing glasses/contacts?: No Does the patient have difficulty concentrating, remembering, or making decisions?: No Patient able to express need for assistance with ADLs?: Yes Does the patient have difficulty dressing or bathing?: No Independently performs ADLs?: Yes (appropriate for developmental age) Does the patient have difficulty walking or climbing stairs?: No Weakness of Legs: None Weakness of Arms/Hands: None  Permission Sought/Granted Permission sought to share information with : Case Manager Permission granted to share information with : Yes, Verbal Permission Granted     Permission granted to share info w AGENCY: [facility], [facility]        Emotional Assessment Appearance:: Appears stated age Attitude/Demeanor/Rapport: Gracious Affect (typically observed): Calm Orientation: :  Oriented to Self, Oriented to Place Alcohol / Substance Use: Not Applicable Psych Involvement: No (comment)  Admission diagnosis:  LFT elevation [R79.89] Symptomatic anemia  [D64.9] Cough, unspecified type [R05.9] Patient Active Problem List   Diagnosis Date Noted   Symptomatic anemia 11/10/2020   ARF (acute renal failure) ([place]) 11/10/2020   Elevated LFTs 11/10/2020   Hoarseness 11/10/2020   Carotid bruit 10/17/2014   CAD (coronary artery disease) 12/16/2012   CAD (coronary artery disease) of artery bypass graft 12/16/2012   HTN (hypertension) 12/16/2012   Renal insufficiency 12/16/2012   Hyperlipidemia with target LDL less than 70 12/16/2012   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 [place] [address] Phone: [phone] Fax: [phone]   Readmission Risk Interventions No flowsheet data found.

## 2020-11-15 ENCOUNTER — Telehealth: Payer: Self-pay | Admitting: Cardiovascular Disease

## 2020-11-15 DIAGNOSIS — I2510 Atherosclerotic heart disease of native coronary artery without angina pectoris: Secondary | ICD-10-CM | POA: Diagnosis not present

## 2020-11-15 DIAGNOSIS — K5900 Constipation, unspecified: Secondary | ICD-10-CM | POA: Diagnosis not present

## 2020-11-15 DIAGNOSIS — N1832 Chronic kidney disease, stage 3b: Secondary | ICD-10-CM | POA: Diagnosis not present

## 2020-11-15 DIAGNOSIS — Z87891 Personal history of nicotine dependence: Secondary | ICD-10-CM | POA: Diagnosis not present

## 2020-11-15 DIAGNOSIS — Z7902 Long term (current) use of antithrombotics/antiplatelets: Secondary | ICD-10-CM | POA: Diagnosis not present

## 2020-11-15 DIAGNOSIS — Z9181 History of falling: Secondary | ICD-10-CM | POA: Diagnosis not present

## 2020-11-15 DIAGNOSIS — J449 Chronic obstructive pulmonary disease, unspecified: Secondary | ICD-10-CM | POA: Diagnosis not present

## 2020-11-15 DIAGNOSIS — D649 Anemia, unspecified: Secondary | ICD-10-CM | POA: Diagnosis not present

## 2020-11-15 DIAGNOSIS — Z8673 Personal history of transient ischemic attack (TIA), and cerebral infarction without residual deficits: Secondary | ICD-10-CM | POA: Diagnosis not present

## 2020-11-15 DIAGNOSIS — I10 Essential (primary) hypertension: Secondary | ICD-10-CM | POA: Diagnosis not present

## 2020-11-15 NOTE — Telephone Encounter (Signed)
[place] from [facility] is wanting to know if Dr. Claiborne Billings will sign an order for Home PT for this patient. Marlaine Hind can be reached at [phone] Marlaine Hind is faxing the order over now!

## 2020-11-21 NOTE — Telephone Encounter (Signed)
Frederick Kirby with [facility] is following up regarding home PT order. Please return call to discuss when able.

## 2020-11-21 NOTE — Telephone Encounter (Signed)
Left message for home health stating that Dr. Claiborne Billings has not gotten back with [place] yet but once he does we will be in contact. Will forward to MD.

## 2020-11-23 NOTE — Telephone Encounter (Signed)
Defer to primary provider

## 2020-11-26 ENCOUNTER — Telehealth: Payer: Self-pay | Admitting: Cardiovascular Disease

## 2020-11-26 NOTE — Telephone Encounter (Signed)
Primary care doctor will not sign the home health order.  If Dr. Claiborne Billings is not willing to sign pt will be discharged from home health services... please advise

## 2020-11-26 NOTE — Telephone Encounter (Signed)
Pt returning phone call... please advise  

## 2020-11-26 NOTE — Telephone Encounter (Signed)
Left message on confidential voicemail for Frederick Kirby at home health, stating that Dr. Claiborne Billings would like to defer this to patients PCP. Call back number left in case there are any questions or concerns.

## 2020-12-02 ENCOUNTER — Ambulatory Visit (INDEPENDENT_AMBULATORY_CARE_PROVIDER_SITE_OTHER): Payer: PPO

## 2020-12-02 ENCOUNTER — Ambulatory Visit
Admission: RE | Admit: 2020-12-02 | Discharge: 2020-12-02 | Disposition: A | Discharge status: Home / Self Care | Payer: PPO | Source: Ambulatory Visit | Attending: Internal Medicine | Admitting: Internal Medicine

## 2020-12-02 ENCOUNTER — Other Ambulatory Visit: Payer: Self-pay

## 2020-12-02 VITALS — BP 137/68 | HR 66 | Temp 98.0°F | Resp 16

## 2020-12-02 DIAGNOSIS — R7401 Elevation of levels of liver transaminase levels: Secondary | ICD-10-CM

## 2020-12-02 DIAGNOSIS — R059 Cough, unspecified: Secondary | ICD-10-CM | POA: Diagnosis not present

## 2020-12-02 DIAGNOSIS — R053 Chronic cough: Secondary | ICD-10-CM | POA: Diagnosis not present

## 2020-12-02 DIAGNOSIS — I517 Cardiomegaly: Secondary | ICD-10-CM | POA: Diagnosis not present

## 2020-12-02 MED ORDER — BENZONATATE 100 MG PO CAPS: 100.0000 mg | ORAL_CAPSULE | Freq: Three times a day (TID) | ORAL | 0 refills | Status: DC | PRN

## 2020-12-02 NOTE — ED Triage Notes (Signed)
Discharged from hospital two weeks prior due to viral illness. Was told to follow up but needs new PCP. Still has a cough that family is concerned about. Denies fever.

## 2020-12-02 NOTE — Discharge Instructions (Signed)
Your chest x-ray was normal.  You have been prescribed a new cough medication to take as needed.  Cough with viral illness may last for multiple weeks.  Please go the hospital if shortness of breath develops.  Please follow-up with primary care as soon as possible for further evaluation and management.  Blood work is pending.  We will call if there are any abnormalities.  Primary care assistance has been requested for you.

## 2020-12-02 NOTE — ED Provider Notes (Signed)
[facility]    CSN: [id number] Arrival date & time: 12/02/20  0945      History   Chief Complaint No chief complaint on file.   HPI Frederick Kirby is a 85 y.o. male.   Patient presents for follow-up after admission to the hospital for a viral illness on 11/10/2020.  Patient presented  to the hospital for cough, shortness of breath, hoarseness, sore throat.  Patient was admitted due to elevated creatinine, symptomatic anemia, elevated liver enzymes.  Several tests were completed including right upper quadrant ultrasound, CT scan, HIDA scan.  Right upper quadrant ultrasound showed Cholelithiasis/sludge within gallbladder wall thickening but HIDA scan was negative.  CT scan was negative.  Patient had elevated troponins that trended down prior to discharge.  Medical history include CAD status post CABG, chronic kidney disease stage III, COPD.  Patient smoked cigarettes for 55 years.  Patient was advised to follow-up with PCP in 1 to 2 weeks following discharge and to repeat CBC and CMP.  Patient does not have PCP because wife states that he did not go to PCP for 2 years and no longer has a PCP.  He is closely followed by cardiology.  Patient also presents today for persistent cough but denies shortness of breath, chest pain, fatigue.  Denies any fevers.    Past Medical History:  Diagnosis Date   COPD (chronic obstructive pulmonary disease) ([place])    Coronary artery disease    Hyperlipidemia    Hypertension    Mild renal insufficiency    Myocardial infarction ([place])    S/P CABG (coronary artery bypass graft) 1994   Stroke [facility])     Patient Active Problem List   Diagnosis Date Noted   Symptomatic anemia 11/10/2020   ARF (acute renal failure) ([place]) 11/10/2020   Elevated LFTs 11/10/2020   Hoarseness 11/10/2020   Carotid bruit 10/17/2014   CAD (coronary artery disease) 12/16/2012   CAD (coronary artery disease) of artery bypass graft 12/16/2012   HTN (hypertension)  12/16/2012   Renal insufficiency 12/16/2012   Hyperlipidemia with target LDL less than 70 12/16/2012   Iliac artery aneurysm ([place]) 12/16/2012    Past Surgical History:  Procedure Laterality Date   CAROTID DOPPLER  12/2011   Right Bulb/Prox ICA 50-69% diameter reduction; Left Bulb/Prox ICA 0-49% diameter reduction   CORONARY ANGIOPLASTY WITH STENT PLACEMENT  04/28/2003   L Cfx with 99% prox lesion in AV groove; patent SVG to ramus intermedius w/ 95% distal lesion; SVG to OM totally occluded; SVG to RCA totally occluded; native RCA occluded in prox region w/collaterals from LAD into PDA; Taxus 2.5x14mm stent to distal VG to ramus (Dr. Gerrie Nordmann)   CORONARY ARTERY BYPASS GRAFT  01/1992   LIMA-LAD & diagonal, SVG to diagonal, SVG to OM1 & OM2, VG to PDA & PLA (Dr. Redmond Pulling)   [place]  11/2011   lexiscan - normal study, EF 80% post-stress   RENAL DOPPLER  12/2011   Right CIA with aneursymal dilatation 2.3x3.1   TRANSTHORACIC ECHOCARDIOGRAM  10/2008   EF 50-55%, borderline conc LVH, mild posterior wall hypokinesis; mild mitral annular calcification, prolapse of anterior leaflets, mild MR; RVSP 30-51mmHg; mild TR; mild calcification of AV leaflets       Home Medications    Prior to Admission medications   Medication Sig Start Date End Date Taking? Authorizing Provider  benzonatate (TESSALON) 100 MG capsule Take 1 capsule (100 mg total) by mouth every 8 (eight) hours  as needed for cough. 12/02/20  Yes Sergey Ishler, Hildred Alamin E, FNP  amLODipine (NORVASC) 10 MG tablet Take 1 tablet (10 mg total) by mouth daily. 03/05/20   [name], MD  clopidogrel (PLAVIX) 75 MG tablet Take 1 tablet (75 mg total) by mouth daily. 03/05/20   [name], MD  Dextromethorphan-guaiFENesin 5-100 MG/5ML LIQD Take 5 mLs by mouth daily as needed (cough).    [provider]  diphenhydramine-acetaminophen (TYLENOL PM) 25-500 MG TABS tablet Take 1 tablet by mouth at bedtime as needed (pain).     [provider]  FOLBIC 2.5-25-2 MG TABS tablet Take 1 tablet by mouth daily. 03/05/20   [name], MD  magnesium hydroxide (MILK OF MAGNESIA) 400 MG/5ML suspension Take 15 mLs by mouth See admin instructions. Every 4 days    [provider]  metoprolol succinate (TOPROL-XL) 25 MG 24 hr tablet Take 1 tablet (25 mg total) by mouth daily. 03/05/20   [name], MD  Omega-3 Fatty Acids (FISH OIL) 1200 MG CAPS Take 1 capsule (1,200 mg total) by mouth 2 (two) times daily. 03/05/20   [name], MD  pantoprazole (PROTONIX) 40 MG tablet Take 1 tablet (40 mg total) by mouth daily. 03/05/20   [name], MD    Family History Family History  Problem Relation Age of Onset   Cancer Mother     Social History Social History   Tobacco Use   Smoking status: Former    Types: Cigarettes    Quit date: 01/13/2005    Years since quitting: 15.8   Smokeless tobacco: Never  Substance Use Topics   Alcohol use: No   Drug use: No     Allergies   Niaspan [niacin er]   Review of Systems Review of Systems Per HPI  Physical Exam Triage Vital Signs ED Triage Vitals  Enc Vitals Group     BP 12/02/20 1005 137/68     Pulse Rate 12/02/20 1005 66     Resp 12/02/20 1005 16     Temp 12/02/20 1005 98 F (36.7 C)     Temp Source 12/02/20 1005 Oral     SpO2 12/02/20 1005 98 %     Weight --      Height --      Head Circumference --      Peak Flow --      Pain Score 12/02/20 1006 0     Pain Loc --      Pain Edu? --      Excl. in [place]? --    No data found.  Updated Vital Signs BP 137/68 (BP Location: Left Arm)   Pulse 66   Temp 98 F (36.7 C) (Oral)   Resp 16   SpO2 98%   Visual Acuity Right Eye Distance:   Left Eye Distance:   Bilateral Distance:    Right Eye Near:   Left Eye Near:    Bilateral Near:     Physical Exam Constitutional:      General: He is not in acute distress.    Appearance: Normal appearance. He is not toxic-appearing or diaphoretic.   HENT:     Head: Normocephalic and atraumatic.     Right Ear: Tympanic membrane and ear canal normal.     Left Ear: Tympanic membrane and ear canal normal.     Nose: Nose normal.     Mouth/Throat:     Mouth: Mucous membranes are moist.  Pharynx: No posterior oropharyngeal erythema.  Eyes:     Extraocular Movements: Extraocular movements intact.     Conjunctiva/sclera: Conjunctivae normal.     Pupils: Pupils are equal, round, and reactive to light.  Cardiovascular:     Rate and Rhythm: Normal rate and regular rhythm.     Pulses: Normal pulses.  Pulmonary:     Effort: Pulmonary effort is normal.     Comments: Crackles in bilateral bases. Skin:    General: Skin is warm and dry.     Comments: No edema noted.  Neurological:     General: No focal deficit present.     Mental Status: He is alert and oriented to person, place, and time. Mental status is at baseline.  Psychiatric:        Mood and Affect: Mood normal.        Behavior: Behavior normal.        Thought Content: Thought content normal.        Judgment: Judgment normal.     UC Treatments / Results  Labs (all labs ordered are listed, but only abnormal results are displayed) Labs Reviewed  CBC  COMPREHENSIVE METABOLIC PANEL    EKG   Radiology DG Chest 2 View  Result Date: 12/02/2020 CLINICAL DATA:  Persistent cough for 3 weeks EXAM: CHEST - 2 VIEW COMPARISON:  11/14/2020 FINDINGS: Cardiomegaly status post median sternotomy and CABG. Unchanged elevation of the right hemidiaphragm. No acute appearing airspace opacity. Disc degenerative disease of the thoracic spine. IMPRESSION: No acute abnormality of the lungs. Unchanged elevation of the right hemidiaphragm. No acute appearing airspace opacity. Electronically Signed   By: Delanna Ahmadi M.D.   On: 12/02/2020 10:43    Procedures Procedures (including critical care time)  Medications Ordered in UC Medications - No data to display  Initial Impression / Assessment  and Plan / UC Course  I have reviewed the triage vital signs and the nursing notes.  Pertinent labs & imaging results that were available during my care of the patient were reviewed by me and considered in my medical decision making (see chart for details).     Chest x-ray was negative for any acute cardiopulmonary process.  CMP and CBC pending as a recheck.  Patient is nontoxic-appearing and does not appear to be in respiratory distress.  Do not think patient is in need of a immediate medical attention at the hospital at this time.  Patient will need to follow-up with primary care soon as soon as possible for further evaluation and management and proper hospital follow-up.  PCP assistance requested for patient.  Benzonatate prescribed to help alleviate cough.  Discussed with patient and family member that coughs with viral infections can last for multiple weeks. Discussed strict return precautions.  Patient verbalized understanding and was agreeable with plan.  Discussed clinical course with supervising physician. (Dr. Lanny Cramp) Final Clinical Impressions(s) / UC Diagnoses   Final diagnoses:  Persistent cough  Transaminitis     Discharge Instructions      Your chest x-ray was normal.  You have been prescribed a new cough medication to take as needed.  Cough with viral illness may last for multiple weeks.  Please go the hospital if shortness of breath develops.  Please follow-up with primary care as soon as possible for further evaluation and management.  Blood work is pending.  We will call if there are any abnormalities.  Primary care assistance has been requested for you.     ED Prescriptions  Medication Sig Dispense Auth. Provider   benzonatate (TESSALON) 100 MG capsule Take 1 capsule (100 mg total) by mouth every 8 (eight) hours as needed for cough. 21 capsule [place], Michele Rockers, [place]      PDMP not reviewed this encounter.   Teodora Medici, [place] 12/02/20 1118

## 2020-12-03 NOTE — Telephone Encounter (Signed)
MD did not want to sign- he states PCP should sign if they feel they should continue, if not it can be discontinued.

## 2020-12-04 LAB — COMPREHENSIVE METABOLIC PANEL
ALT: 43 IU/L (ref 0–44)
AST: 30 IU/L (ref 0–40)
Albumin/Globulin Ratio: 1.7 (ref 1.2–2.2)
Albumin: 4.5 g/dL (ref 3.6–4.6)
Alkaline Phosphatase: 95 IU/L (ref 44–121)
BUN/Creatinine Ratio: 10 (ref 10–24)
BUN: 15 mg/dL (ref 8–27)
Bilirubin Total: 0.2 mg/dL (ref 0.0–1.2)
CO2: 20 mmol/L (ref 20–29)
Calcium: 9.2 mg/dL (ref 8.6–10.2)
Chloride: 103 mmol/L (ref 96–106)
Creatinine, Ser: 1.56 mg/dL — ABNORMAL HIGH (ref 0.76–1.27)
Globulin, Total: 2.6 g/dL (ref 1.5–4.5)
Glucose: 98 mg/dL (ref 70–99)
Potassium: 4.7 mmol/L (ref 3.5–5.2)
Sodium: 138 mmol/L (ref 134–144)
Total Protein: 7.1 g/dL (ref 6.0–8.5)
eGFR: 44 mL/min/{1.73_m2} — ABNORMAL LOW (ref >59)

## 2020-12-04 LAB — CBC
Hematocrit: 34.8 % — ABNORMAL LOW (ref 37.5–51.0)
Hemoglobin: 10.6 g/dL — ABNORMAL LOW (ref 13.0–17.7)
MCH: 22.2 pg — ABNORMAL LOW (ref 26.6–33.0)
MCHC: 30.5 g/dL — ABNORMAL LOW (ref 31.5–35.7)
MCV: 73 fL — ABNORMAL LOW (ref 79–97)
Platelets: 378 10*3/uL (ref 150–450)
RBC: 4.77 x10E6/uL (ref 4.14–5.80)
RDW: 22.9 % — ABNORMAL HIGH (ref 11.6–15.4)
WBC: 6.1 10*3/uL (ref 3.4–10.8)

## 2020-12-05 NOTE — Telephone Encounter (Signed)
Spoke with [place], aware of the recommendations.

## 2020-12-05 NOTE — Telephone Encounter (Signed)
Home Health calling to follow up on status of order.

## 2021-01-16 ENCOUNTER — Other Ambulatory Visit: Payer: Self-pay | Admitting: Gastroenterology

## 2021-01-17 ENCOUNTER — Telehealth: Payer: Self-pay | Admitting: *Deleted

## 2021-01-17 NOTE — Telephone Encounter (Signed)
° °  Patient Name: Frederick Kirby  DOB: 1935/09/24 MRN: [id number]  Primary Cardiologist: [name], MD  Chart reviewed as part of pre-operative protocol coverage. Last OV 03/05/20 so will be nearing 1 year follow-up OV soon but still within 12 month window for clearing colonoscopy. Will route to Dr. Claiborne Billings for input on holding Plavix or colonoscopy then pt will need call. Per chart, pt has hx of CAD s/p CABG 1994, PCI to VG in 2005, carotid artery disease in the moderate range and disturbed left subclavian flow. Last ischemic eval by nuc 2013 was normal. Dr. Claiborne Billings - Please route response to P CV DIV PREOP (the pre-op pool). Thank you.    Charlie Pitter, PA-C 01/17/2021, 11:02 AM

## 2021-01-17 NOTE — Telephone Encounter (Signed)
° °  Pre-operative Risk Assessment    Patient Name: Frederick Kirby  DOB: [birth year] MRN: [id number]      Request for Surgical Clearance    Procedure:   Colonoscopy/Endoscopy  Date of Surgery:  Clearance 02/26/21                                 Surgeon:  Dr Watt Climes Surgeon's Group or Practice Name:  [facility] Phone number:  [phone] Fax number:  [phone]    Type of Clearance Requested:   - Pharmacy:  Hold Clopidogrel (Plavix) 5 days prior to procedure   Type of Anesthesia:   propofol   Additional requests/questions:  Please advise surgeon/provider what medications should be held.  Olin Pia   01/17/2021, 8:26 AM

## 2021-01-17 NOTE — Telephone Encounter (Signed)
Okay to hold Plavix for colonoscopy 

## 2021-01-18 NOTE — Telephone Encounter (Signed)
° °  Primary Cardiologist: [name], MD  Chart reviewed as part of pre-operative protocol coverage. Given past medical history and time since last visit, based on ACC/AHA guidelines, Frederick Kirby would be at acceptable risk for the planned procedure without further cardiovascular testing.   His Plavix may be held for 5 days prior to his procedure.  Please resume as soon as hemostasis is achieved.  Patient was advised that if he develops new symptoms prior to surgery to contact our office to arrange a follow-up appointment.  He verbalized understanding.  I will route this recommendation to the requesting party via Epic fax function and remove from pre-op pool.  Please call with questions.  Jossie Ng. Connar Keating NP-C    01/18/2021, 8:17 AM [place] [address] Office [phone] Fax [phone]

## 2021-01-19 ENCOUNTER — Other Ambulatory Visit: Payer: Self-pay | Admitting: Physician Assistant

## 2021-01-19 DIAGNOSIS — K8080 Other cholelithiasis without obstruction: Secondary | ICD-10-CM

## 2021-01-19 DIAGNOSIS — R7989 Other specified abnormal findings of blood chemistry: Secondary | ICD-10-CM

## 2021-01-19 DIAGNOSIS — R1011 Right upper quadrant pain: Secondary | ICD-10-CM

## 2021-01-23 NOTE — Progress Notes (Signed)
Subjective:    Frederick Kirby - 86 y.o. male MRN [id number]  Date of birth: 27-Jul-1935  HPI  [name] is to establish care and hospital discharge follow-up. He is accompanied by his wife Frederick Kirby.   Current issues and/or concerns: HOSPITAL DISCHARGE FOLLOW-UP: 11/10/2020 - 11/14/2020 at [facility] per MD note: Brief Hospital Course: Hoarseness of voice/sore throat cough: Likely viral in etiology-strep throat/respiratory virus panel/influenza negative.  CT soft tissue neck without any major abnormalities.  Hoarseness of voice getting better-supportive care.  Extensive viral serologies were negative.  See above.   Symptomatic anemia-microcytic-iron deficient: Suspect recent blood loss-although FOBT negative.  Transfused 2 units of PRBC-hemoglobin now stable.  GI recommending outpatient endoscopic evaluation.  Continue PPI.   Transaminitis: Unclear etiology-concern that this may be viral etiology given that he had hoarseness/sore throat.  Acute hepatitis serology negative. CMV/EBV serology pending.  Echo with stable EF-unlikely to be congestive hepatitis.  Discussed with Dr. Williams Che MD-LFTs now improving-we both feel like an MRCP is not needed at this point.  Have asked patient's wife/patient to ensure that he gets a repeat chemistry panel in the next 1-2 weeks to ensure LFTs continue to downtrend.   AKI on CKD stage IIIb: Suspect AKI is hemodynamically mediated-probably due to acute viral illness/prodrome-possibly from recent blood loss.  No hydronephrosis evident on CT abdomen-UA without proteinuria.  Creatinine improved with just supportive care-creatinine now close to baseline.   CAD-s/p remote CABG/PCI: No chest pain-shortness of breath/fatigue is from symptomatic anemia.  Since no overt blood loss-suspect okay to resume Plavix.   HTN: BP stable-continue amlodipine and metoprolol.   HLD: Continue to hold statin until transaminitis improves.   History of CVA    BMI: Estimated body mass index is 28.42 kg/m as calculated from the following:   Height as of this encounter: $RemoveBeforeD'5\' 9"'PmFIBAnLStSrsw$  (1.753 m).   Weight as of this encounter: 87.3 kg.     Procedures None   Discharge Diagnoses:  Active Problems:   CAD (coronary artery disease)   HTN (hypertension)   Symptomatic anemia   ARF (acute renal failure) (H[facility])   Elevated LFTs   Hoarseness  Follow-Ups Follow up with Gastroenterology, [name] in 2 months (01/13/2021); Anemia, abnormal LFTs, on Plavix Schedule an appointment with [name], MD (Family Medicine) in 1 week (11/21/2020) Schedule an appointment with [name], MD (Cardiology) in 1 month (12/14/2020) Follow up with [facility]; Rolling Walker to be delivered to the room prior to d/c. Follow up with Health, [facility] ([place]); Now known as [facility]: Physical and Occupational Therapy-office to call with vist times.   01/28/2021: Hoarseness of voice/sore throat cough follow-up:  Reports still has hoarseness. Noticed improvement for a day or so them went back hoarse. Has increased phlegm in throat and cough. Reports sometimes productive cough with yellow-white thick mucus. No difficulty eating or swallowing foods and beverages.  Symptomatic anemia-microcytic-iron deficient follow-up:  Still taking Protonix. Followed by Gastroenterology. Colonoscopy scheduled 02/26/2021.    Transaminitis follow-up:  Wife reports patient still taking cholesterol medication since hospital discharge. Reports she wants approval to discontinue cholesterol medication from [name], MD at Cardiology as he prescribed patient this medication. Reports has tried to call the heart doctor office to schedule appointment without success.    CAD-s/p remote CABG/PCI follow-up:  Still taking Plavix. Denies chest pain and shortness of breath. Has been unable to schedule appointment with [name], MD at Cardiology, tried calling without  response.  Reports patient using rolling walker only as needed. Declines referral to PT/OT. Does not use oxygen.  HTN follow-up:  Doing well on current regimen, no issues/concerns.   HLD follow-up:  Wife reports patient still taking cholesterol medication. Reports she wants approval from [name], MD at Cardiology to discontinue cholesterol medication as he prescribed patient this medication. Reports has tried to call the heart doctor office to schedule appointment without success.        ROS per HPI     Health Maintenance:  Health Maintenance Due  Topic Date Due   Pneumonia Vaccine 30+ Years old (1 - PCV) Never done   TETANUS/TDAP  Never done   Zoster Vaccines- Shingrix (1 of 2) Never done   COVID-19 Vaccine (3 - Booster for [employer] series) 07/26/2019     Past Medical History: Patient Active Problem List   Diagnosis Date Noted   Anemia due to chronic blood loss 01/28/2021   Blood in feces 01/28/2021   Cholelithiasis 01/28/2021   Chronic obstructive pulmonary disease, unspecified ([place]) 01/28/2021   Gastrointestinal hemorrhage 19/37/9024   Helicobacter pylori gastrointestinal tract infection 01/28/2021   History of duodenal ulcer 09/73/5329   History of Helicobacter pylori infection 01/28/2021   Personal history of colonic polyps 01/28/2021   Right upper quadrant pain 01/28/2021   Coronary artery disease 01/28/2021   Other specified abnormal findings of blood chemistry 01/28/2021   Transaminitis 01/28/2021   Voice hoarseness 01/28/2021   Symptomatic anemia 11/10/2020   ARF (acute renal failure) ([place]) 11/10/2020   Elevated LFTs 11/10/2020   Hoarseness 11/10/2020   Carotid bruit 10/17/2014   CAD (coronary artery disease) 12/16/2012   CAD (coronary artery disease) of artery bypass graft 12/16/2012   HTN (hypertension) 12/16/2012   Renal insufficiency 12/16/2012   Hyperlipidemia with target LDL less than 70 12/16/2012   Iliac artery aneurysm ([place]) 12/16/2012       Social History   reports that he quit smoking about 16 years ago. His smoking use included cigarettes. He has never used smokeless tobacco. He reports that he does not drink alcohol and does not use drugs.   Family History  family history includes Cancer in his mother.   Medications: reviewed and updated   Objective:   Physical Exam BP 126/72 (BP Location: Left Arm, Patient Position: Sitting, Cuff Size: Normal)    Pulse 67    Temp 98.5 F (36.9 C)    Resp 18    Ht 5' 8.11" (1.73 m)    Wt 179 lb (81.2 kg)    SpO2 96%    BMI 27.13 kg/m  Physical Exam HENT:     Head: Normocephalic and atraumatic.  Eyes:     Extraocular Movements: Extraocular movements intact.     Conjunctiva/sclera: Conjunctivae normal.     Pupils: Pupils are equal, round, and reactive to light.  Cardiovascular:     Rate and Rhythm: Normal rate and regular rhythm.     Pulses: Normal pulses.     Heart sounds: Normal heart sounds.  Musculoskeletal:     Cervical back: Normal range of motion and neck supple.  Neurological:     General: No focal deficit present.     Mental Status: He is alert and oriented to person, place, and time.  Psychiatric:        Mood and Affect: Mood normal.        Behavior: Behavior normal.      Assessment & Plan:  1. Encounter to establish care: - Patient  presents today to establish care.  - Return for annual physical examination, labs, and health maintenance. Arrive fasting meaning having no food for at least 8 hours prior to appointment. You may have only water or black coffee. Please take scheduled medications as normal.  2. Hospital discharge follow-up: - Reviewed hospital course, current medications, ensured proper follow-up in place, and addressed concerns.   3. Hoarseness: - Referral to ENT for further evaluation and management.  - Ambulatory referral to ENT  4. Symptomatic anemia: - Update CBC.  - Keep all scheduled appointments with Gastroenterology.  - CBC  5.  Transaminitis: - Update CMP.  - CMP14+EGFR  6. AKI (acute kidney injury) ([place]): - Update CMP. - CMP14+EGFR  7. Stage 3b chronic kidney disease ([place]): - Update CMP. - CMP14+EGFR  8. Coronary artery disease involving native coronary artery of native heart without angina pectoris: 9. Hx of CABG: 10. History of CVA (cerebrovascular accident): 11. Essential hypertension: 12. Hyperlipidemia with target LDL less than 70: - Keep all scheduled appointments with [name], MD at Cardiology.     Patient was given clear instructions to go to Emergency Department or return to medical center if symptoms don't improve, worsen, or new problems develop.The patient verbalized understanding.  I discussed the assessment and treatment plan with the patient. The patient was provided an opportunity to ask questions and all were answered. The patient agreed with the plan and demonstrated an understanding of the instructions.   The patient was advised to call back or seek an in-person evaluation if the symptoms worsen or if the condition fails to improve as anticipated.    Durene Fruits, NP 01/28/2021, 2:52 PM Primary Care at [facility]

## 2021-01-28 ENCOUNTER — Telehealth: Payer: Self-pay | Admitting: Family

## 2021-01-28 ENCOUNTER — Ambulatory Visit (INDEPENDENT_AMBULATORY_CARE_PROVIDER_SITE_OTHER): Payer: PPO | Admitting: Family

## 2021-01-28 ENCOUNTER — Encounter: Payer: Self-pay | Admitting: Family

## 2021-01-28 ENCOUNTER — Other Ambulatory Visit: Payer: Self-pay

## 2021-01-28 VITALS — BP 126/72 | HR 67 | Temp 98.5°F | Resp 18 | Ht 68.11 in | Wt 179.0 lb

## 2021-01-28 DIAGNOSIS — R7401 Elevation of levels of liver transaminase levels: Secondary | ICD-10-CM | POA: Diagnosis not present

## 2021-01-28 DIAGNOSIS — I10 Essential (primary) hypertension: Secondary | ICD-10-CM

## 2021-01-28 DIAGNOSIS — N1832 Chronic kidney disease, stage 3b: Secondary | ICD-10-CM

## 2021-01-28 DIAGNOSIS — N179 Acute kidney failure, unspecified: Secondary | ICD-10-CM | POA: Diagnosis not present

## 2021-01-28 DIAGNOSIS — D649 Anemia, unspecified: Secondary | ICD-10-CM | POA: Diagnosis not present

## 2021-01-28 DIAGNOSIS — Z8673 Personal history of transient ischemic attack (TIA), and cerebral infarction without residual deficits: Secondary | ICD-10-CM

## 2021-01-28 DIAGNOSIS — K921 Melena: Secondary | ICD-10-CM | POA: Insufficient documentation

## 2021-01-28 DIAGNOSIS — Z7689 Persons encountering health services in other specified circumstances: Secondary | ICD-10-CM

## 2021-01-28 DIAGNOSIS — J449 Chronic obstructive pulmonary disease, unspecified: Secondary | ICD-10-CM | POA: Insufficient documentation

## 2021-01-28 DIAGNOSIS — I2510 Atherosclerotic heart disease of native coronary artery without angina pectoris: Secondary | ICD-10-CM

## 2021-01-28 DIAGNOSIS — D500 Iron deficiency anemia secondary to blood loss (chronic): Secondary | ICD-10-CM | POA: Insufficient documentation

## 2021-01-28 DIAGNOSIS — K922 Gastrointestinal hemorrhage, unspecified: Secondary | ICD-10-CM | POA: Insufficient documentation

## 2021-01-28 DIAGNOSIS — E785 Hyperlipidemia, unspecified: Secondary | ICD-10-CM

## 2021-01-28 DIAGNOSIS — R1011 Right upper quadrant pain: Secondary | ICD-10-CM | POA: Insufficient documentation

## 2021-01-28 DIAGNOSIS — R490 Dysphonia: Secondary | ICD-10-CM

## 2021-01-28 DIAGNOSIS — R7989 Other specified abnormal findings of blood chemistry: Secondary | ICD-10-CM | POA: Insufficient documentation

## 2021-01-28 DIAGNOSIS — K8020 Calculus of gallbladder without cholecystitis without obstruction: Secondary | ICD-10-CM | POA: Insufficient documentation

## 2021-01-28 DIAGNOSIS — Z8719 Personal history of other diseases of the digestive system: Secondary | ICD-10-CM | POA: Insufficient documentation

## 2021-01-28 DIAGNOSIS — A048 Other specified bacterial intestinal infections: Secondary | ICD-10-CM | POA: Insufficient documentation

## 2021-01-28 DIAGNOSIS — Z860100 Personal history of colon polyps, unspecified: Secondary | ICD-10-CM | POA: Insufficient documentation

## 2021-01-28 DIAGNOSIS — Z951 Presence of aortocoronary bypass graft: Secondary | ICD-10-CM

## 2021-01-28 DIAGNOSIS — Z8619 Personal history of other infectious and parasitic diseases: Secondary | ICD-10-CM | POA: Insufficient documentation

## 2021-01-28 DIAGNOSIS — Z09 Encounter for follow-up examination after completed treatment for conditions other than malignant neoplasm: Secondary | ICD-10-CM

## 2021-01-28 NOTE — Patient Instructions (Signed)
Anemia °Anemia is a condition in which there is not enough red blood cells or hemoglobin in the blood. Hemoglobin is a substance in red blood cells that carries oxygen. °When you do not have enough red blood cells or hemoglobin (are anemic), your body cannot get enough oxygen and your organs may not work properly. As a result, you may feel very tired or have other problems. °What are the causes? °Common causes of anemia include: °Excessive bleeding. Anemia can be caused by excessive bleeding inside or outside the body, including bleeding from the intestines or from heavy menstrual periods in females. °Poor nutrition. °Long-lasting (chronic) kidney, thyroid, and liver disease. °Bone marrow disorders, spleen problems, and blood disorders. °Cancer and treatments for cancer. °HIV (human immunodeficiency virus) and AIDS (acquired immunodeficiency syndrome). °Infections, medicines, and autoimmune disorders that destroy red blood cells. °What are the signs or symptoms? °Symptoms of this condition include: °Minor weakness. °Dizziness. °Headache, or difficulties concentrating and sleeping. °Heartbeats that feel irregular or faster than normal (palpitations). °Shortness of breath, especially with exercise. °Pale skin, lips, and nails, or cold hands and feet. °Indigestion and nausea. °Symptoms may occur suddenly or develop slowly. If your anemia is mild, you may not have symptoms. °How is this diagnosed? °This condition is diagnosed based on blood tests, your medical history, and a physical exam. In some cases, a test may be needed in which cells are removed from the soft tissue inside of a bone and looked at under a microscope (bone marrow biopsy). Your health care provider may also check your stool (feces) for blood and may do additional testing to look for the cause of your bleeding. °Other tests may include: °Imaging tests, such as a CT scan or MRI. °A procedure to see inside your esophagus and stomach (endoscopy). °A  procedure to see inside your colon and rectum (colonoscopy). °How is this treated? °Treatment for this condition depends on the cause. If you continue to lose a lot of blood, you may need to be treated at a hospital. Treatment may include: °Taking supplements of iron, vitamin B12, or folic acid. °Taking a hormone medicine (erythropoietin) that can help to stimulate red blood cell growth. °Having a blood transfusion. This may be needed if you lose a lot of blood. °Making changes to your diet. °Having surgery to remove your spleen. °Follow these instructions at home: °Take over-the-counter and prescription medicines only as told by your health care provider. °Take supplements only as told by your health care provider. °Follow any diet instructions that you were given by your health care provider. °Keep all follow-up visits as told by your health care provider. This is important. °Contact a health care provider if: °You develop new bleeding anywhere in the body. °Get help right away if: °You are very weak. °You are short of breath. °You have pain in your abdomen or chest. °You are dizzy or feel faint. °You have trouble concentrating. °You have bloody stools, black stools, or tarry stools. °You vomit repeatedly or you vomit up blood. °These symptoms may represent a serious problem that is an emergency. Do not wait to see if the symptoms will go away. Get medical help right away. Call your local emergency services (911 in the [place] Do not drive yourself to the hospital. °Summary °Anemia is a condition in which you do not have enough red blood cells or enough of a substance in your red blood cells that carries oxygen (hemoglobin). °Symptoms may occur suddenly or develop slowly. °If your anemia is   mild, you may not have symptoms. °This condition is diagnosed with blood tests, a medical history, and a physical exam. Other tests may be needed. °Treatment for this condition depends on the cause of the anemia. °This  information is not intended to replace advice given to you by your health care provider. Make sure you discuss any questions you have with your health care provider. °Document Revised: 12/07/2018 Document Reviewed: 12/07/2018 °Elsevier Patient Education © 2022 [employer] ° °

## 2021-01-28 NOTE — Progress Notes (Signed)
Pt presents to establish care accompanied by wife [name], spouse states she is concerned about cough, pt states he does not have a cough

## 2021-01-29 ENCOUNTER — Other Ambulatory Visit: Payer: Self-pay | Admitting: Family

## 2021-01-29 DIAGNOSIS — N1832 Chronic kidney disease, stage 3b: Secondary | ICD-10-CM

## 2021-01-29 LAB — CMP14+EGFR
ALT: 36 IU/L (ref 0–44)
AST: 25 IU/L (ref 0–40)
Albumin/Globulin Ratio: 1.9 (ref 1.2–2.2)
Albumin: 4.7 g/dL — ABNORMAL HIGH (ref 3.6–4.6)
Alkaline Phosphatase: 92 IU/L (ref 44–121)
BUN/Creatinine Ratio: 11 (ref 10–24)
BUN: 19 mg/dL (ref 8–27)
Bilirubin Total: 0.2 mg/dL (ref 0.0–1.2)
CO2: 24 mmol/L (ref 20–29)
Calcium: 9 mg/dL (ref 8.6–10.2)
Chloride: 105 mmol/L (ref 96–106)
Creatinine, Ser: 1.76 mg/dL — ABNORMAL HIGH (ref 0.76–1.27)
Globulin, Total: 2.5 g/dL (ref 1.5–4.5)
Glucose: 110 mg/dL — ABNORMAL HIGH (ref 70–99)
Potassium: 4.4 mmol/L (ref 3.5–5.2)
Sodium: 143 mmol/L (ref 134–144)
Total Protein: 7.2 g/dL (ref 6.0–8.5)
eGFR: 38 mL/min/{1.73_m2} — ABNORMAL LOW (ref >59)

## 2021-01-29 LAB — CBC
Hematocrit: 36.7 % — ABNORMAL LOW (ref 37.5–51.0)
Hemoglobin: 11.3 g/dL — ABNORMAL LOW (ref 13.0–17.7)
MCH: 24.5 pg — ABNORMAL LOW (ref 26.6–33.0)
MCHC: 30.8 g/dL — ABNORMAL LOW (ref 31.5–35.7)
MCV: 79 fL (ref 79–97)
Platelets: 293 10*3/uL (ref 150–450)
RBC: 4.62 x10E6/uL (ref 4.14–5.80)
RDW: 21.7 % — ABNORMAL HIGH (ref 11.6–15.4)
WBC: 7.8 10*3/uL (ref 3.4–10.8)

## 2021-01-29 NOTE — Progress Notes (Signed)
Please call patient with update.    Anemia improved some since 4 weeks ago. Liver enzymes back to normal. Keep all scheduled appointments with Gastroenterology and Cardiology.   Kidney function not 100%. Referral to Nephrology for further evaluation and management. Their office should call within 2 weeks with appointment details.

## 2021-01-29 NOTE — Telephone Encounter (Signed)
Spoke w/pt about labs, gave him cardiologist phone number as pt did not have it

## 2021-02-17 ENCOUNTER — Other Ambulatory Visit: Payer: Self-pay

## 2021-02-17 ENCOUNTER — Ambulatory Visit
Admission: RE | Admit: 2021-02-17 | Discharge: 2021-02-17 | Disposition: A | Discharge status: Home / Self Care | Payer: PPO | Source: Ambulatory Visit | Attending: Physician Assistant | Admitting: Physician Assistant

## 2021-02-17 DIAGNOSIS — R7989 Other specified abnormal findings of blood chemistry: Secondary | ICD-10-CM

## 2021-02-17 DIAGNOSIS — R1011 Right upper quadrant pain: Secondary | ICD-10-CM

## 2021-02-17 DIAGNOSIS — K8080 Other cholelithiasis without obstruction: Secondary | ICD-10-CM

## 2021-02-17 MED ORDER — GADOBENATE DIMEGLUMINE 529 MG/ML IV SOLN
17.0000 mL | Freq: Once | INTRAVENOUS | Status: AC | PRN
  Administered 2021-02-17: 17 mL via INTRAVENOUS

## 2021-02-18 ENCOUNTER — Encounter (HOSPITAL_COMMUNITY): Payer: Self-pay | Admitting: Gastroenterology

## 2021-02-26 ENCOUNTER — Ambulatory Visit (HOSPITAL_COMMUNITY)
Admission: RE | Admit: 2021-02-26 | Discharge: 2021-02-26 | Disposition: A | Discharge status: Home / Self Care | Payer: PPO | Source: Ambulatory Visit | Attending: Gastroenterology | Admitting: Gastroenterology

## 2021-02-26 ENCOUNTER — Ambulatory Visit (HOSPITAL_BASED_OUTPATIENT_CLINIC_OR_DEPARTMENT_OTHER): Payer: PPO | Admitting: Certified Registered"

## 2021-02-26 ENCOUNTER — Encounter (HOSPITAL_COMMUNITY): Payer: Self-pay | Admitting: Gastroenterology

## 2021-02-26 ENCOUNTER — Ambulatory Visit (HOSPITAL_COMMUNITY): Payer: PPO | Admitting: Certified Registered"

## 2021-02-26 ENCOUNTER — Other Ambulatory Visit: Payer: Self-pay

## 2021-02-26 ENCOUNTER — Encounter (HOSPITAL_COMMUNITY)
Admission: RE | Disposition: A | Discharge status: Home / Self Care | Payer: Self-pay | Source: Ambulatory Visit | Attending: Gastroenterology

## 2021-02-26 DIAGNOSIS — K5730 Diverticulosis of large intestine without perforation or abscess without bleeding: Secondary | ICD-10-CM | POA: Insufficient documentation

## 2021-02-26 DIAGNOSIS — Z87891 Personal history of nicotine dependence: Secondary | ICD-10-CM | POA: Diagnosis not present

## 2021-02-26 DIAGNOSIS — K635 Polyp of colon: Secondary | ICD-10-CM | POA: Diagnosis not present

## 2021-02-26 DIAGNOSIS — K648 Other hemorrhoids: Secondary | ICD-10-CM | POA: Insufficient documentation

## 2021-02-26 DIAGNOSIS — K621 Rectal polyp: Secondary | ICD-10-CM | POA: Insufficient documentation

## 2021-02-26 DIAGNOSIS — K5750 Diverticulosis of both small and large intestine without perforation or abscess without bleeding: Secondary | ICD-10-CM

## 2021-02-26 DIAGNOSIS — K644 Residual hemorrhoidal skin tags: Secondary | ICD-10-CM | POA: Diagnosis not present

## 2021-02-26 DIAGNOSIS — D124 Benign neoplasm of descending colon: Secondary | ICD-10-CM | POA: Insufficient documentation

## 2021-02-26 DIAGNOSIS — D125 Benign neoplasm of sigmoid colon: Secondary | ICD-10-CM | POA: Insufficient documentation

## 2021-02-26 DIAGNOSIS — K5710 Diverticulosis of small intestine without perforation or abscess without bleeding: Secondary | ICD-10-CM | POA: Diagnosis not present

## 2021-02-26 DIAGNOSIS — K31819 Angiodysplasia of stomach and duodenum without bleeding: Secondary | ICD-10-CM | POA: Diagnosis not present

## 2021-02-26 DIAGNOSIS — D500 Iron deficiency anemia secondary to blood loss (chronic): Secondary | ICD-10-CM | POA: Insufficient documentation

## 2021-02-26 HISTORY — PX: POLYPECTOMY: SHX5525

## 2021-02-26 HISTORY — PX: BIOPSY: SHX5522

## 2021-02-26 HISTORY — PX: ESOPHAGOGASTRODUODENOSCOPY (EGD) WITH PROPOFOL: SHX5813

## 2021-02-26 HISTORY — PX: COLONOSCOPY WITH PROPOFOL: SHX5780

## 2021-02-26 LAB — POCT I-STAT, CHEM 8
BUN: 24 mg/dL — ABNORMAL HIGH (ref 8–23)
Calcium, Ion: 1.2 mmol/L (ref 1.15–1.40)
Chloride: 108 mmol/L (ref 98–111)
Creatinine, Ser: 1.5 mg/dL — ABNORMAL HIGH (ref 0.61–1.24)
Glucose, Bld: 99 mg/dL (ref 70–99)
HCT: 42 % (ref 39.0–52.0)
Hemoglobin: 14.3 g/dL (ref 13.0–17.0)
Potassium: 4.5 mmol/L (ref 3.5–5.1)
Sodium: 140 mmol/L (ref 135–145)
TCO2: 23 mmol/L (ref 22–32)

## 2021-02-26 SURGERY — ESOPHAGOGASTRODUODENOSCOPY (EGD) WITH PROPOFOL
Anesthesia: General

## 2021-02-26 MED ORDER — LACTATED RINGERS IV SOLN: INTRAVENOUS | Status: DC

## 2021-02-26 MED ORDER — PROPOFOL 500 MG/50ML IV EMUL
INTRAVENOUS | Status: DC | PRN
  Administered 2021-02-26: 125 ug/kg/min via INTRAVENOUS

## 2021-02-26 MED ORDER — EPHEDRINE SULFATE-NACL 50-0.9 MG/10ML-% IV SOSY
PREFILLED_SYRINGE | INTRAVENOUS | Status: DC | PRN
  Administered 2021-02-26: 10 mg via INTRAVENOUS

## 2021-02-26 MED ORDER — PROPOFOL 10 MG/ML IV BOLUS
INTRAVENOUS | Status: DC | PRN
Start: 2021-02-26 — End: 2021-02-26
  Administered 2021-02-26 (×3): 20 mg via INTRAVENOUS

## 2021-02-26 MED ORDER — LIDOCAINE 2% (20 MG/ML) 5 ML SYRINGE
INTRAMUSCULAR | Status: DC | PRN
  Administered 2021-02-26: 60 mg via INTRAVENOUS

## 2021-02-26 MED ORDER — PHENYLEPHRINE 40 MCG/ML (10ML) SYRINGE FOR IV PUSH (FOR BLOOD PRESSURE SUPPORT)
PREFILLED_SYRINGE | INTRAVENOUS | Status: DC | PRN
  Administered 2021-02-26: 120 ug via INTRAVENOUS

## 2021-02-26 SURGICAL SUPPLY — 25 items

## 2021-02-26 NOTE — Progress Notes (Signed)
Frederick Kirby 11:59 AM  Subjective: Patient without any significant problems since he was seen recently in our office and known to me from a recent hospital stay and has no GI complaints and supposedly did okay with the prep  Objective: Vital signs stable afebrile no acute distress exam please see preassessment evaluation January labs reviewed fairly recent CT and MRI reviewed  Assessment: Chronic anemia questionable etiology  Plan: Okay to proceed with endoscopy and colonoscopy with anesthesia assistance  [facility]  office [phone] After 5PM or if no answer call [phone]

## 2021-02-26 NOTE — Anesthesia Preprocedure Evaluation (Addendum)
Anesthesia Evaluation  Patient identified by MRN, date of birth, ID band Patient awake    Reviewed: Allergy & Precautions, NPO status , Patient's Chart, lab work & pertinent test results  Airway Mallampati: II  TM Distance: >3 FB     Dental   Pulmonary COPD, former smoker,    breath sounds clear to auscultation       Cardiovascular hypertension, + CAD and + Past MI  + Valvular Problems/Murmurs AS  Rhythm:Regular Rate:Normal  History noted Dr. Nyoka Cowden   Neuro/Psych CVA    GI/Hepatic negative GI ROS, Neg liver ROS,   Endo/Other    Renal/GU Renal disease     Musculoskeletal   Abdominal   Peds  Hematology   Anesthesia Other Findings   Reproductive/Obstetrics                            Anesthesia Physical Anesthesia Plan  ASA: 3  Anesthesia Plan: General   Post-op Pain Management:    Induction: Intravenous  PONV Risk Score and Plan: 2 and Ondansetron and Propofol infusion  Airway Management Planned:   Additional Equipment:   Intra-op Plan:   Post-operative Plan:   Informed Consent: I have reviewed the patients History and Physical, chart, labs and discussed the procedure including the risks, benefits and alternatives for the proposed anesthesia with the patient or authorized representative who has indicated his/her understanding and acceptance.     Dental advisory given  Plan Discussed with: CRNA and Anesthesiologist  Anesthesia Plan Comments:         Anesthesia Quick Evaluation

## 2021-02-26 NOTE — Transfer of Care (Signed)
Immediate Anesthesia Transfer of Care Note  Patient: Frederick Kirby  Procedure(s) Performed: ESOPHAGOGASTRODUODENOSCOPY (EGD) WITH PROPOFOL COLONOSCOPY WITH PROPOFOL POLYPECTOMY BIOPSY  Patient Location: PACU and Endoscopy Unit  Anesthesia Type:MAC  Level of Consciousness: awake, drowsy and patient cooperative  Airway & Oxygen Therapy: Patient Spontanous Breathing and Patient connected to face mask oxygen  Post-op Assessment: Report given to RN and Post -op Vital signs reviewed and stable  Post vital signs: Reviewed and stable  Last Vitals:  Vitals Value Taken Time  BP    Temp    Pulse 87 02/26/21 1257  Resp 22 02/26/21 1257  SpO2 99 % 02/26/21 1257  Vitals shown include unvalidated device data.  Last Pain:  Vitals:   02/26/21 1103  TempSrc: Oral         Complications: No notable events documented.

## 2021-02-26 NOTE — Discharge Instructions (Addendum)
Call if question or problem otherwise soft solids today and if okay may resume Plavix on Thursday and call for biopsy report in 1 week and follow-up as needed if GI question or problem   YOU HAD AN ENDOSCOPIC PROCEDURE TODAY: Refer to the procedure report and other information in the discharge instructions given to you for any specific questions about what was found during the examination. If this information does not answer your questions, please call [facility] office at [phone] to clarify.   YOU SHOULD EXPECT: Some feelings of bloating in the abdomen. Passage of more gas than usual. Walking can help get rid of the air that was put into your GI tract during the procedure and reduce the bloating. If you had a lower endoscopy (such as a colonoscopy or flexible sigmoidoscopy) you may notice spotting of blood in your stool or on the toilet paper. Some abdominal soreness may be present for a day or two, also.  DIET: Your first meal following the procedure should be a light meal and then it is ok to progress to your normal diet. A half-sandwich or bowl of soup is an example of a good first meal. Heavy or fried foods are harder to digest and may make you feel nauseous or bloated. Drink plenty of fluids but you should avoid alcoholic beverages for 24 hours. If you had a esophageal dilation, please see attached instructions for diet.    ACTIVITY: Your care partner should take you home directly after the procedure. You should plan to take it easy, moving slowly for the rest of the day. You can resume normal activity the day after the procedure however YOU SHOULD NOT DRIVE, use power tools, machinery or perform tasks that involve climbing or major physical exertion for 24 hours (because of the sedation medicines used during the test).   SYMPTOMS TO REPORT IMMEDIATELY: A gastroenterologist can be reached at any hour. Please call [phone]  for any of the following symptoms:  Following lower endoscopy  (colonoscopy, flexible sigmoidoscopy) Excessive amounts of blood in the stool  Significant tenderness, worsening of abdominal pains  Swelling of the abdomen that is new, acute  Fever of 100 or higher  Following upper endoscopy (EGD, EUS, ERCP, esophageal dilation) Vomiting of blood or coffee ground material  New, significant abdominal pain  New, significant chest pain or pain under the shoulder blades  Painful or persistently difficult swallowing  New shortness of breath  Black, tarry-looking or red, bloody stools  FOLLOW UP:  If any biopsies were taken you will be contacted by phone or by letter within the next 1-3 weeks. Call [phone]  if you have not heard about the biopsies in 3 weeks.  Please also call with any specific questions about appointments or follow up tests. YOU HAD AN ENDOSCOPIC PROCEDURE TODAY: Refer to the procedure report and other information in the discharge instructions given to you for any specific questions about what was found during the examination. If this information does not answer your questions, please call [facility] office at [phone] to clarify.   YOU SHOULD EXPECT: Some feelings of bloating in the abdomen. Passage of more gas than usual. Walking can help get rid of the air that was put into your GI tract during the procedure and reduce the bloating. If you had a lower endoscopy (such as a colonoscopy or flexible sigmoidoscopy) you may notice spotting of blood in your stool or on the toilet paper. Some abdominal soreness may be present for a  day or two, also.  DIET: Your first meal following the procedure should be a light meal and then it is ok to progress to your normal diet. A half-sandwich or bowl of soup is an example of a good first meal. Heavy or fried foods are harder to digest and may make you feel nauseous or bloated. Drink plenty of fluids but you should avoid alcoholic beverages for 24 hours. If you had a esophageal dilation, please see attached  instructions for diet.    ACTIVITY: Your care partner should take you home directly after the procedure. You should plan to take it easy, moving slowly for the rest of the day. You can resume normal activity the day after the procedure however YOU SHOULD NOT DRIVE, use power tools, machinery or perform tasks that involve climbing or major physical exertion for 24 hours (because of the sedation medicines used during the test).   SYMPTOMS TO REPORT IMMEDIATELY: A gastroenterologist can be reached at any hour. Please call [phone]  for any of the following symptoms:  Following lower endoscopy (colonoscopy, flexible sigmoidoscopy) Excessive amounts of blood in the stool  Significant tenderness, worsening of abdominal pains  Swelling of the abdomen that is new, acute  Fever of 100 or higher  Following upper endoscopy (EGD, EUS, ERCP, esophageal dilation) Vomiting of blood or coffee ground material  New, significant abdominal pain  New, significant chest pain or pain under the shoulder blades  Painful or persistently difficult swallowing  New shortness of breath  Black, tarry-looking or red, bloody stools  FOLLOW UP:  If any biopsies were taken you will be contacted by phone or by letter within the next 1-3 weeks. Call [phone]  if you have not heard about the biopsies in 3 weeks.  Please also call with any specific questions about appointments or follow up tests.

## 2021-02-26 NOTE — Op Note (Signed)
[facility] Patient Name: Frederick Kirby Procedure Date: 02/26/2021 MRN: [id number] Attending MD: Clarene Essex , MD Date of Birth: 1935/06/15 CSN: [id number] Age: 86 Admit Type: Outpatient Procedure:                Upper GI endoscopy Indications:              Iron deficiency anemia secondary to chronic blood                            loss Providers:                Clarene Essex, MD, Debi Mays, RN, Jaci Carrel, RN,                            Cherylynn Ridges, Technician, Tyna Jaksch                            Technician, Christell Faith, CRNA Referring MD:              Medicines:                Monitored Anesthesia Care Complications:            No immediate complications. Estimated Blood Loss:     Estimated blood loss: none. Procedure:                Pre-Anesthesia Assessment:                           - Prior to the procedure, a History and Physical                            was performed, and patient medications and                            allergies were reviewed. The patient's tolerance of                            previous anesthesia was also reviewed. The risks                            and benefits of the procedure and the sedation                            options and risks were discussed with the patient.                            All questions were answered, and informed consent                            was obtained. Prior Anticoagulants: The patient has                            taken Plavix (clopidogrel), last dose was 4 days  prior to procedure. ASA Grade Assessment: III - A                            patient with severe systemic disease. After                            reviewing the risks and benefits, the patient was                            deemed in satisfactory condition to undergo the                            procedure.                           After obtaining informed consent, the endoscope was                             passed under direct vision. Throughout the                            procedure, the patient's blood pressure, pulse, and                            oxygen saturations were monitored continuously. The                            [id number]-[id number] ([id number] Olympus endoscope was introduced                            through the mouth, and advanced to the third part                            of duodenum. The upper GI endoscopy was                            accomplished without difficulty. The patient                            tolerated the procedure well. Scope In: Scope Out: Findings:      The larynx was normal.      The examined esophagus was normal.      A single small angiodysplastic lesion with no bleeding was found in the       cardia.      A medium diverticulum was found in the third portion of the duodenum.      The exam was otherwise without abnormality. Impression:               - Normal larynx.                           - Normal esophagus.                           - A single non-bleeding angiodysplastic lesion in  the stomach.                           - Duodenal diverticulum.                           - The examination was otherwise normal.                           - No specimens collected. Moderate Sedation:      Not Applicable - Patient had care per Anesthesia. Recommendation:           - Patient has a contact number available for                            emergencies. The signs and symptoms of potential                            delayed complications were discussed with the                            patient. Return to normal activities tomorrow.                            Written discharge instructions were provided to the                            patient.                           - Soft diet today.                           - Continue present medications.                           - Return to GI clinic PRN.                            - Await pathology results.                           - Telephone GI clinic for pathology results in 1                            week.                           - Resume Plavix (clopidogrel) at prior dose in 2                            days.                           - Perform a colonoscopy today. Procedure Code(s):        --- Professional ---  27517, Esophagogastroduodenoscopy, flexible,                            transoral; diagnostic, including collection of                            specimen(s) by brushing or washing, when performed                            (separate procedure) Diagnosis Code(s):        --- Professional ---                           K31.819, Angiodysplasia of stomach and duodenum                            without bleeding                           D50.0, Iron deficiency anemia secondary to blood                            loss (chronic)                           K57.10, Diverticulosis of small intestine without                            perforation or abscess without bleeding CPT copyright 2019 [employer]. All rights reserved. The codes documented in this report are preliminary and upon coder review may  be revised to meet current compliance requirements. Clarene Essex, MD 02/26/2021 1:09:09 PM This report has been signed electronically. Number of Addenda: 0

## 2021-02-26 NOTE — Anesthesia Postprocedure Evaluation (Signed)
Anesthesia Post Note  Patient: Frederick Kirby  Procedure(s) Performed: ESOPHAGOGASTRODUODENOSCOPY (EGD) WITH PROPOFOL COLONOSCOPY WITH PROPOFOL POLYPECTOMY BIOPSY     Patient location during evaluation: PACU Anesthesia Type: General Level of consciousness: awake Pain management: pain level controlled Vital Signs Assessment: post-procedure vital signs reviewed and stable Respiratory status: spontaneous breathing Postop Assessment: no apparent nausea or vomiting Anesthetic complications: no   No notable events documented.  Last Vitals:  Vitals:   02/26/21 1321 02/26/21 1330  BP: (!) 165/65 (!) 158/68  Pulse: 79 82  Resp: 16 19  Temp:    SpO2: 92% 95%    Last Pain:  Vitals:   02/26/21 1330  TempSrc:   PainSc: 0-No pain                 Egbert Seidel

## 2021-02-26 NOTE — Anesthesia Procedure Notes (Addendum)
Procedure Name: MAC Date/Time: 02/26/2021 11:58 AM Performed by: West Pugh, CRNA Pre-anesthesia Checklist: Patient identified, Emergency Drugs available, Suction available, Patient being monitored and Timeout performed Patient Re-evaluated:Patient Re-evaluated prior to induction Oxygen Delivery Method: Simple face mask Preoxygenation: Pre-oxygenation with 100% oxygen Induction Type: IV induction Placement Confirmation: positive ETCO2 Dental Injury: Teeth and Oropharynx as per pre-operative assessment

## 2021-02-26 NOTE — Op Note (Signed)
[facility] Patient Name: Frederick Kirby Procedure Date: 02/26/2021 MRN: [id number] Attending MD: Clarene Essex , MD Date of Birth: September 29, 1935 CSN: [id number] Age: 86 Admit Type: Outpatient Procedure:                Colonoscopy Indications:              This is the patient's first colonoscopy, Iron                            deficiency anemia secondary to chronic blood loss Providers:                Clarene Essex, MD, Debi Mays, RN, Cherylynn Ridges,                            Technician, Tyna Jaksch Technician, Christell Faith,                            CRNA Referring MD:              Medicines:                Monitored Anesthesia Care Complications:            No immediate complications. Estimated Blood Loss:     Estimated blood loss: none. Procedure:                Pre-Anesthesia Assessment:                           - Prior to the procedure, a History and Physical                            was performed, and patient medications and                            allergies were reviewed. The patient's tolerance of                            previous anesthesia was also reviewed. The risks                            and benefits of the procedure and the sedation                            options and risks were discussed with the patient.                            All questions were answered, and informed consent                            was obtained. Prior Anticoagulants: The patient has                            taken Plavix (clopidogrel), last dose was 4 days  prior to procedure. ASA Grade Assessment: III - A                            patient with severe systemic disease. After                            reviewing the risks and benefits, the patient was                            deemed in satisfactory condition to undergo the                            procedure.                           After obtaining informed consent, the colonoscope                             was passed under direct vision. Throughout the                            procedure, the patient's blood pressure, pulse, and                            oxygen saturations were monitored continuously. The                            CF-HQ190L ([id number] Olympus colonoscope was                            introduced through the anus and advanced to the the                            cecum, identified by appendiceal orifice and                            ileocecal valve. The ileocecal valve, appendiceal                            orifice, and rectum were photographed. The                            colonoscopy was somewhat difficult due to                            significant looping and a tortuous colon.                            Successful completion of the procedure was aided by                            applying abdominal pressure. During the procedure  the patient had to be repositioned. The quality of                            the bowel preparation was adequate to identify                            polyps 6 mm and larger in size. Scope In: 12:10:55 PM Scope Out: 12:51:06 PM Total Procedure Duration: 0 hours 40 minutes 11 seconds  Findings:      Hemorrhoids were found on perianal exam.      Scattered small and large-mouthed diverticula were found in the sigmoid       colon and descending colon.      A diminutive polyp was found in the proximal transverse colon. The polyp       was semi-sessile. This was not biopsied only seen on insertion not found       on withdrawal      A diminutive polyp was found in the proximal descending colon. The polyp       was semi-sessile. Biopsies were taken with a cold forceps for histology.      A small polyp was found in the distal sigmoid colon. The polyp was       semi-sessile. The polyp was removed with a hot snare. Resection and       retrieval were complete.      A small polyp was found in the  rectum. The polyp was semi-sessile. The       polyp was removed with a hot snare. Resection and retrieval were       complete.      External and internal hemorrhoids were found during retroflexion, during       perianal exam and during digital exam. The hemorrhoids were small.      The exam was otherwise without abnormality. Impression:               - Hemorrhoids found on perianal exam.                           - Diverticulosis in the sigmoid colon and in the                            descending colon.                           - One diminutive polyp in the proximal transverse                            colon. Not biopsy                           - One diminutive polyp in the proximal descending                            colon. Biopsied.                           - One small polyp in the distal sigmoid colon,  removed with a hot snare. Resected and retrieved.                           - One small polyp in the rectum, removed with a hot                            snare. Resected and retrieved.                           - External and internal hemorrhoids.                           - The examination was otherwise normal. Moderate Sedation:      Not Applicable - Patient had care per Anesthesia. Recommendation:           - Patient has a contact number available for                            emergencies. The signs and symptoms of potential                            delayed complications were discussed with the                            patient. Return to normal activities tomorrow.                            Written discharge instructions were provided to the                            patient.                           - Soft diet today.                           - Continue present medications.                           - Await pathology results.                           - Repeat colonoscopy date to be determined after                            pending  pathology results are reviewed for                            surveillance based on pathology results.                           - Return to GI office PRN.                           - Telephone GI clinic for pathology results in 1  week.                           - Telephone GI clinic if symptomatic PRN. Procedure Code(s):        --- Professional ---                           [phone], Colonoscopy, flexible; with removal of                            tumor(s), polyp(s), or other lesion(s) by snare                            technique                           [id number], 37, Colonoscopy, flexible; with biopsy,                            single or multiple Diagnosis Code(s):        --- Professional ---                           K63.5, Polyp of colon                           K62.1, Rectal polyp                           D50.0, Iron deficiency anemia secondary to blood                            loss (chronic)                           K57.30, Diverticulosis of large intestine without                            perforation or abscess without bleeding CPT copyright 2019 [employer]. All rights reserved. The codes documented in this report are preliminary and upon coder review may  be revised to meet current compliance requirements. Clarene Essex, MD 02/26/2021 1:16:54 PM This report has been signed electronically. Number of Addenda: 0

## 2021-02-27 ENCOUNTER — Encounter (HOSPITAL_COMMUNITY): Payer: Self-pay | Admitting: Gastroenterology

## 2021-02-27 LAB — SURGICAL PATHOLOGY

## 2021-03-02 ENCOUNTER — Other Ambulatory Visit: Payer: Self-pay | Admitting: Cardiovascular Disease

## 2021-03-10 ENCOUNTER — Other Ambulatory Visit: Payer: Self-pay | Admitting: Cardiovascular Disease

## 2021-03-29 ENCOUNTER — Other Ambulatory Visit: Payer: Self-pay

## 2021-03-29 ENCOUNTER — Ambulatory Visit (HOSPITAL_COMMUNITY)
Admission: RE | Admit: 2021-03-29 | Discharge: 2021-03-29 | Disposition: A | Discharge status: Home / Self Care | Payer: PPO | Source: Ambulatory Visit | Attending: Internal Medicine | Admitting: Internal Medicine

## 2021-03-29 DIAGNOSIS — I6523 Occlusion and stenosis of bilateral carotid arteries: Secondary | ICD-10-CM

## 2021-03-29 DIAGNOSIS — I723 Aneurysm of iliac artery: Secondary | ICD-10-CM | POA: Diagnosis present

## 2021-04-16 ENCOUNTER — Other Ambulatory Visit: Payer: Self-pay | Admitting: Cardiovascular Disease

## 2021-06-06 ENCOUNTER — Ambulatory Visit (INDEPENDENT_AMBULATORY_CARE_PROVIDER_SITE_OTHER): Payer: PPO | Admitting: Cardiovascular Disease

## 2021-06-06 ENCOUNTER — Encounter: Payer: Self-pay | Admitting: Cardiovascular Disease

## 2021-06-06 DIAGNOSIS — I10 Essential (primary) hypertension: Secondary | ICD-10-CM

## 2021-06-06 DIAGNOSIS — Z79899 Other long term (current) drug therapy: Secondary | ICD-10-CM

## 2021-06-06 DIAGNOSIS — I2510 Atherosclerotic heart disease of native coronary artery without angina pectoris: Secondary | ICD-10-CM | POA: Diagnosis not present

## 2021-06-06 DIAGNOSIS — D500 Iron deficiency anemia secondary to blood loss (chronic): Secondary | ICD-10-CM | POA: Diagnosis not present

## 2021-06-06 DIAGNOSIS — I350 Nonrheumatic aortic (valve) stenosis: Secondary | ICD-10-CM | POA: Diagnosis not present

## 2021-06-06 DIAGNOSIS — Z951 Presence of aortocoronary bypass graft: Secondary | ICD-10-CM

## 2021-06-06 DIAGNOSIS — N289 Disorder of kidney and ureter, unspecified: Secondary | ICD-10-CM

## 2021-06-06 DIAGNOSIS — I6523 Occlusion and stenosis of bilateral carotid arteries: Secondary | ICD-10-CM

## 2021-06-06 DIAGNOSIS — I723 Aneurysm of iliac artery: Secondary | ICD-10-CM

## 2021-06-06 NOTE — Progress Notes (Signed)
21 04/02/2022 patient ID: Frederick Kirby, male   DOB: 08/07/1935, 86 y.o.   MRN: [id number]     HPI: Frederick Kirby is a 86 y.o. male who presents for a 57 month cardiology evaluation.  Frederick Kirby has a history of coronary as well as peripheral vascular disease. In 1994 he underwent CABG revascularization surgery( LIMA to the LAD, sequential vein to the OM1 and OM2, sequential vein to the PDA and PLA vessel). In April 2005, a 2.5x24 mm Taxus stent was inserted into the vein graft supplying the ramus intermediate vessel. At that time, his RCA graft was occluded and he also had an occluded graft to the marginal vessel but had a patent LIMA to the LAD.   Additional problems include mixed hyperlipidemia, documented iliac artery aneurysm, mild renal insufficiency, hypertension, as well as hyperlipidemia. He's also had instances with stage II hypertension requiring medication adjustment. He has a history of renal insufficiency  which did improve with reduction of his lisinopril from 20 mg to 10 mg and further titration his metoprolol extended release 75 mg.    Has a history of hyperlipidemia on Crestor 40 mg daily , and lipid studies in March 2015 revealed cholesterol 131, triglycerides 153, HDL 47, LDL 63.  In March 2015 abdominal aortic ultrasound demonstrated  mild amount of nonhemodynamically significant plaque.  There also is mild aneurysmal dilatation of his right common iliac artery , which measured 2.0 x 2.2 cm, unchanged from two years previously.  He has carotid disease with a carotid duplex study in January 2017 demonstrating a 60-79% right internal carotid stenosis and 40-59% left internal carotid artery stenosis.   He has a history of chronic kidney disease, stage III.  His last serum creatinine was 1.71  He underwent a follow-up carotid study on February 2018 which actually was improved from previously and showed 40-59% right internal carotid stenosis in the left internal carotid stenosis is in  the 1-39% range.  He has remained active.  When I saw him one year ago, he denied but has tolerated any episodes of chest pain, PND or orthopnea. In the past he had been started on irbesartan, but apparently did not tolerate this well since When I last saw him one year ago, he was on over the past year, he has remained lisinopril at 2.5 mg mg daily in addition to amlodipine 10 mg and Toprol-XL 75 mg   I  saw him in March 2019.  At that time he denied any chest pain and continued to be active.  He denied PND orthopnea.   He underwent a one-year follow-up carotid duplex scan on February 20, 2017 which again remains stable with 40-59% right ICA narrowing and 1 at 39% left carotid narrowing.  There is 50% stenoses bilaterally in the external carotids.  Vertebral arteries were patent with antegrade flow.  He had normal subclavian flow. He has not had recent lab work.  He continues to be on rosuvastatin 40 mg and fish oil for mixed hyperlipidemia.  He was on omeprazole for GERD and continue to be on clopidogrel.  He did not take aspirin due to history of ulcer disease.  Since I saw him, he was evaluated in a telemedicine visit in October 2020 by Almyra Deforest, PA and was remaining stable.  He underwent follow-up carotid and abdominal aortic aneurysm Doppler studies in March 2021.  He again was found to have mild to moderate disease in the right carotid in the 40 to 59% range  and left carotid in the 1 to 39% range.  He had bilateral antegrade flow.  There was disturbed velocity in the left subclavian artery.  There was no evidence for any abdominal aortic aneurysm.  His documented iliac artery aneurysm was stable in size at 2.1 cm.  I last saw him on March 05, 2020 after not having seen him in 34 months.  At that time he felt well and denied any recurrent chest pain, PND orthopnea.  He had not had recent laboratory.  He continued  to be on Plavix but is not on aspirin due to a prior bleeding ulcer.  He continues to  be on amlodipine 10 mg, lisinopril 2.5 mg and metoprolol succinate 25 mg for hypertension.  He is tolerating rosuvastatin 40 mg and over-the-counter fish oil for mixed hyperlipidemia.  GERD is controlled with pantoprazole.   Since I last saw him, he was in the hospital from October 29 through November 14, 2020 when he presented with cough, shortness of breath, hoarseness of his voice for several days and had had URI symptoms for 7 days.  During his hospitalization he was found to have AKA and transaminitis.  Tells me he received 2 units of blood transfusion.  Right upper quadrant ultrasound showed cholelithiasis and sludge within the gallbladder with wall thickening.  CT of his abdomen and pelvis did not show any hepatic lesion or biliary ductal dilatation.  A HIDA scan was negative for cystic duct obstruction.  An echo Doppler study on November 12, 2020 showed an EF of 60 to 65% and mild LVH.  There was mild to moderate aortic valve stenosis with a mean gradient of 16 and peak gradient of 26 mm.  Estimated AVA was 1.16 cm.  Presently, he feels well.  He denies any chest pain or shortness of breath.  He denies any presyncope or syncope.  He denies any abdominal pain.  He has not had recent laboratory.     Past Medical History:  Diagnosis Date   COPD (chronic obstructive pulmonary disease) ([place])    Coronary artery disease    Hyperlipidemia    Hypertension    Mild renal insufficiency    Myocardial infarction ([place])    S/P CABG (coronary artery bypass graft) 1994   Stroke [facility])     Past Surgical History:  Procedure Laterality Date   BIOPSY  02/26/2021   Procedure: BIOPSY;  Surgeon: Clarene Essex, MD;  Location: [facility] ENDOSCOPY;  Service: Endoscopy;;   CAROTID DOPPLER  12/2011   Right Bulb/Prox ICA 50-69% diameter reduction; Left Bulb/Prox ICA 0-49% diameter reduction   COLONOSCOPY WITH PROPOFOL N/A 02/26/2021   Procedure: COLONOSCOPY WITH PROPOFOL;  Surgeon: Clarene Essex, MD;  Location: [facility];   Service: Endoscopy;  Laterality: N/A;   CORONARY ANGIOPLASTY WITH STENT PLACEMENT  04/28/2003   L Cfx with 99% prox lesion in AV groove; patent SVG to ramus intermedius w/ 95% distal lesion; SVG to OM totally occluded; SVG to RCA totally occluded; native RCA occluded in prox region w/collaterals from LAD into PDA; Taxus 2.5x20mm stent to distal VG to ramus (Dr. Gerrie Nordmann)   CORONARY ARTERY BYPASS GRAFT  01/1992   LIMA-LAD & diagonal, SVG to diagonal, SVG to OM1 & OM2, VG to PDA & PLA (Dr. Redmond Pulling)   ESOPHAGOGASTRODUODENOSCOPY (EGD) WITH PROPOFOL N/A 02/26/2021   Procedure: ESOPHAGOGASTRODUODENOSCOPY (EGD) WITH PROPOFOL;  Surgeon: Clarene Essex, MD;  Location: [facility];  Service: Endoscopy;  Laterality: N/A;   NM MYOCAR PERF WALL MOTION  11/2011  lexiscan - normal study, EF 80% post-stress   POLYPECTOMY  02/26/2021   Procedure: POLYPECTOMY;  Surgeon: Clarene Essex, MD;  Location: [facility];  Service: Endoscopy;;   RENAL DOPPLER  12/2011   Right CIA with aneursymal dilatation 2.3x3.1   TRANSTHORACIC ECHOCARDIOGRAM  10/2008   EF 50-55%, borderline conc LVH, mild posterior wall hypokinesis; mild mitral annular calcification, prolapse of anterior leaflets, mild MR; RVSP 30-23mmHg; mild TR; mild calcification of AV leaflets    Allergies  Allergen Reactions   Niaspan [Niacin Er]     Turned red & his skin burned    Current Outpatient Medications  Medication Sig Dispense Refill   amLODipine (NORVASC) 10 MG tablet TAKE 1 TABLET BY MOUTH EVERY DAY 60 tablet 1   clopidogrel (PLAVIX) 75 MG tablet TAKE 1 TABLET BY MOUTH EVERY DAY 60 tablet 1   diphenhydramine-acetaminophen (TYLENOL PM) 25-500 MG TABS tablet Take 1 tablet by mouth at bedtime.     Ferrous Sulfate (IRON PO) Take 6.5 mg by mouth daily.     fluticasone (FLONASE) 50 MCG/ACT nasal spray Place 1 spray into both nostrils daily as needed for allergies.     FOLBIC 2.5-25-2 MG TABS tablet Take 1 tablet by mouth daily. 90 tablet 3   lisinopril  (ZESTRIL) 2.5 MG tablet Take 2.5 mg by mouth daily.     magnesium hydroxide (MILK OF MAGNESIA) 400 MG/5ML suspension Take 15 mLs by mouth 3 (three) times a week.     metoprolol succinate (TOPROL-XL) 25 MG 24 hr tablet TAKE 1 TABLET (25 MG TOTAL) BY MOUTH DAILY. 60 tablet 1   Omega-3 Fatty Acids (FISH OIL) 1200 MG CAPS Take 1 capsule (1,200 mg total) by mouth 2 (two) times daily. (Patient taking differently: Take 1 capsule by mouth daily.) 180 capsule 3   pantoprazole (PROTONIX) 40 MG tablet Take 1 tablet (40 mg total) by mouth daily. 90 tablet 3   rosuvastatin (CRESTOR) 40 MG tablet TAKE 1 TABLET BY MOUTH EVERY DAY 60 tablet 1   sodium chloride (OCEAN) 0.65 % SOLN nasal spray Place 1 spray into both nostrils daily as needed for congestion.     oxymetazoline (AFRIN) 0.05 % nasal spray Place 1 spray into both nostrils once a week. (Patient not taking: Reported on 06/06/2021)     No current facility-administered medications for this visit.    Social History   Socioeconomic History   Marital status: Married    Spouse name: Not on file   Number of children: Not on file   Years of education: Not on file   Highest education level: Not on file  Occupational History   Not on file  Tobacco Use   Smoking status: Former    Types: Cigarettes    Quit date: 01/13/2005    Years since quitting: 16.4   Smokeless tobacco: Never  Vaping Use   Vaping Use: Never used  Substance and Sexual Activity   Alcohol use: No   Drug use: No   Sexual activity: Not Currently  Other Topics Concern   Not on file  Social History Narrative   Not on file   Social Determinants of Health   Financial Resource Strain: Not on file  Food Insecurity: Not on file  Transportation Needs: Not on file  Physical Activity: Not on file  Stress: Not on file  Social Connections: Not on file  Intimate Partner Violence: Not on file   Social history is notable that he is married. He is over 18 grandchildren and  at least 7 great  grandchildren. He quit smoking 8 years ago. He remains active the  Family History  Problem Relation Age of Onset   Cancer Mother     ROS General: Negative; No fevers, chills, or night sweats;  HEENT: [place] for sinus drainage; No changes in vision or hearing, sinus congestion, difficulty swallowing Pulmonary: Positive for questionable wheezing.; No cough, shortness of breath, hemoptysis Cardiovascular: see HPI; No chest pain, presyncope, syncope, palpatations; occasional left ankle swelling GI: Negative; No nausea, vomiting, diarrhea, or abdominal pain GU: Negative; No dysuria, hematuria, or difficulty voiding Musculoskeletal: Negative; no myalgias, joint pain, or weakness Hematologic/Oncology: Negative; no easy bruising, bleeding Endocrine: Negative; no heat/cold intolerance; no diabetes Neuro: Negative; no changes in balance, headaches Skin: Negative; No rashes or skin lesions Psychiatric: Negative; No behavioral problems, depression Sleep: Negative; No snoring, daytime sleepiness, hypersomnolence, bruxism, restless legs, hypnogognic hallucinations, no cataplexy Other comprehensive 14 point system review is negative.   Physical Exam BP 130/62 (BP Location: Left Arm)   Pulse (!) 55   Ht $R'5\' 9"'ZJ$  (1.753 m)   Wt 178 lb 9.6 oz (81 kg)   SpO2 96%   BMI 26.37 kg/m    Repeat blood pressure by me was 112/60  Wt Readings from Last 3 Encounters:  06/06/21 178 lb 9.6 oz (81 kg)  02/26/21 170 lb (77.1 kg)  01/28/21 179 lb (81.2 kg)   General: Alert, oriented, no distress.  Skin: normal turgor, no rashes, warm and dry HEENT: Normocephalic, atraumatic. Pupils equal round and reactive to light; sclera anicteric; extraocular muscles intact;  Nose without nasal septal hypertrophy Mouth/Parynx benign; Mallinpatti scale 3 Neck: No JVD, no carotid bruits; normal carotid upstroke Lungs: clear to ausculatation and percussion; no wheezing or rales Chest wall: without tenderness to  palpitation Heart: PMI not displaced, RRR, s1 s2 normal, 3-0/1 systolic murmur, no diastolic murmur, no rubs, gallops, thrills, or heaves Abdomen: soft, nontender; no hepatosplenomehaly, BS+; abdominal aorta nontender and not dilated by palpation. Back: no CVA tenderness Pulses 2+ Musculoskeletal: full range of motion, normal strength, no joint deformities Extremities: no clubbing cyanosis or edema, Homan's sign negative  Neurologic: grossly nonfocal; Cranial nerves grossly wnl Psychologic: Normal mood and affect  Jun 06, 2021 ECG (independently read by me): Sinus bradycardia at 55, 1st degree AV block, NSSTT changes    March 05, 2020 ECG (independently read by me): NSR at 78; nonspecific ST chhanges  March 2019 ECG (independently read by me): Normal sinus rhythm at 68 bpm.  First-degree AV block with a PR interval of $RemoveBef'2 3 2 'vuZPSQmTwl$ ms.  No ectopy.  No ST segment changes.  March 2018  ECG (independently read by me): NSR  At 78 with 1st degree AV block  September 2017 ECG (independently read by me): Sinus rhythm with first degree AV block with a PR interval of 270 ms.  QTc interval is normal.  There are no significant ST-T changes.  November 2016 ECG (independently read by me): Sinus bradycardia 58 bpm.,  First-degree AV block with increased PR interval at 272 ms.  ECG (independently read by me): normal sinus rhythm at 60 bpm.  First-degree AV block.  No significant ST-T changes.  QTc interval normal.  May 2015 ECG (independently read by me): Normal sinus rhythm at 62 beats per minute.  First degree AV block.  Prior ECG: Normal sinus rhythm at 69 beats per minute. First degree AV block with PR interval 236 ms. QTc interval normal at 420 ms.  LABS:  Latest Ref Rng & Units 02/26/2021   11:32 AM 01/28/2021    3:54 PM 12/02/2020   10:54 AM  BMP  Glucose 70 - 99 mg/dL 99   110   98    BUN 8 - 23 mg/dL $Remove'24   19   15    'ktwHInj$ Creatinine 0.61 - 1.24 mg/dL 1.50   1.76   1.56    BUN/Creat Ratio 10 - $Re'24   11   10    'Mqz$ Sodium 135 - 145 mmol/L 140   143   138    Potassium 3.5 - 5.1 mmol/L 4.5   4.4   4.7    Chloride 98 - 111 mmol/L 108   105   103    CO2 20 - 29 mmol/L  24   20    Calcium 8.6 - 10.2 mg/dL  9.0   9.2        Latest Ref Rng & Units 01/28/2021    3:54 PM 12/02/2020   10:54 AM 11/14/2020    3:22 AM  Hepatic Function  Total Protein 6.0 - 8.5 g/dL 7.2   7.1   6.2    Albumin 3.6 - 4.6 g/dL 4.7   4.5   3.2    AST 0 - 40 IU/L 25   30   57    ALT 0 - 44 IU/L 36   43   246    Alk Phosphatase 44 - 121 IU/L 92   95   70    Total Bilirubin 0.0 - 1.2 mg/dL 0.2   0.2   1.0        Latest Ref Rng & Units 02/26/2021   11:32 AM 01/28/2021    3:54 PM 12/02/2020   10:54 AM  CBC  WBC 3.4 - 10.8 x10E3/uL  7.8   6.1    Hemoglobin 13.0 - 17.0 g/dL 14.3   11.3   10.6    Hematocrit 39.0 - 52.0 % 42.0   36.7   34.8    Platelets 150 - 450 x10E3/uL  293   378     Lab Results  Component Value Date   MCV 79 01/28/2021   MCV 73 (L) 12/02/2020   MCV 72.9 (L) 11/14/2020   Lab Results  Component Value Date   TSH 6.130 (H) 03/15/2020   Lab Results  Component Value Date   HGBA1C 6.1 (H) 03/15/2020     Lipid Panel     Component Value Date/Time   CHOL 152 03/15/2020 0919   TRIG 142 03/15/2020 0919   HDL 45 03/15/2020 0919   CHOLHDL 3.4 03/15/2020 0919   CHOLHDL 3.0 09/14/2015 0854   VLDL 32 (H) 09/14/2015 0854   LDLCALC 82 03/15/2020 0919    RADIOLOGY: No results found.  IMPRESSION: 1. Coronary artery disease involving native coronary artery of native heart without angina pectoris   2. Hx of CABG: 1994   3. Essential hypertension   4. Nonrheumatic aortic valve stenosis   5. Anemia due to chronic blood loss   6. Bilateral carotid artery stenosis   7. Iliac artery aneurysm ([place])   8. Medication management   9. Renal insufficiency     ASSESSMENT AND PLAN: Frederick Kirby is an 86 year-old Caucasian male who is 31 years status post CABG revascularization surgery with LIMA to his  LAD, sequential graft to the OM1 and OM2, and sequential graft to the PDA and PLA vessel. He is 17 years since undergoing intervention to the  vein graft supplying the intermediate vessel.  At that time he had an occluded graft to the RCA, as well as an occluded graft to the marginal vessel.  Presently, he remains stable without anginal symptomatology and denies any exertional shortness of breath, PND orthopnea.  I reviewed his hospitalization from late October 2023.  Was felt that he most likely had a viral infection.  During his hospitalization he had symptomatic anemia, microcytic and iron deficient and required 2 units of packed red blood cell transfusion.  He had transaminitis as well as transient AKI.  An echo Doppler study revealed normal LV systolic function with mild LVH with mild to moderate aortic stenosis with a mean gradient of 16 and peak gradient of 26 mm.  In March she had carotid duplex imaging which showed 40 to 59% bilateral ICA stenoses with ECA stenosis greater than 50% and normal subclavian flow bilaterally.  I am recommending follow-up laboratory with a comprehensive metabolic panel, CBC, TSH, and lipid panel.  In addition I will recheck iron studies, ferritin, apparently remotely he is no longer on his previous folic acid I will also check a B12 and folate level.  I will contact him regarding results.  I will see him in 6 months for reevaluation or sooner as needed.  [name], MD, [facility]  06/08/2021 12:46 PM

## 2021-06-06 NOTE — Patient Instructions (Signed)
Medication Instructions:  The current medical regimen is effective;  continue present plan and medications as directed. Please refer to the Current Medication list given to you today.  *If you need a refill on your cardiac medications before your next appointment, please call your pharmacy*  Lab Work:    FASTING LIPID,CMET,CBC,TSH,B-12 AND IRON LAB     If you have labs (blood work) drawn today and your tests are completely normal, you will receive your results only by: [facility] (if you have MyChart) OR  A paper copy in the mail If you have any lab test that is abnormal or we need to change your treatment, we will call you to review the results.  Follow-Up: Your next appointment:  6 MONTHS In Person with [name], MD    At [facility], you and your health needs are our priority.  As part of our continuing mission to provide you with exceptional heart care, we have created designated Provider Care Teams.  These Care Teams include your primary Cardiologist (physician) and Advanced Practice Providers (APPs -  Physician Assistants and Nurse Practitioners) who all work together to provide you with the care you need, when you need it.    Important Information About Sugar

## 2021-06-08 ENCOUNTER — Encounter: Payer: Self-pay | Admitting: Cardiovascular Disease

## 2021-06-14 ENCOUNTER — Other Ambulatory Visit: Payer: Self-pay | Admitting: Cardiovascular Disease

## 2021-06-14 LAB — COMPREHENSIVE METABOLIC PANEL
ALT: 32 IU/L (ref 0–44)
AST: 24 IU/L (ref 0–40)
Albumin/Globulin Ratio: 1.9 (ref 1.2–2.2)
Albumin: 4.8 g/dL — ABNORMAL HIGH (ref 3.6–4.6)
Alkaline Phosphatase: 110 IU/L (ref 44–121)
BUN/Creatinine Ratio: 11 (ref 10–24)
BUN: 20 mg/dL (ref 8–27)
Bilirubin Total: 0.5 mg/dL (ref 0.0–1.2)
CO2: 22 mmol/L (ref 20–29)
Calcium: 9.6 mg/dL (ref 8.6–10.2)
Chloride: 101 mmol/L (ref 96–106)
Creatinine, Ser: 1.78 mg/dL — ABNORMAL HIGH (ref 0.76–1.27)
Globulin, Total: 2.5 g/dL (ref 1.5–4.5)
Glucose: 114 mg/dL — ABNORMAL HIGH (ref 70–99)
Potassium: 5 mmol/L (ref 3.5–5.2)
Sodium: 138 mmol/L (ref 134–144)
Total Protein: 7.3 g/dL (ref 6.0–8.5)
eGFR: 37 mL/min/{1.73_m2} — ABNORMAL LOW (ref >59)

## 2021-06-14 LAB — FERRITIN: Ferritin: 73 ng/mL (ref 30–400)

## 2021-06-14 LAB — B12 AND FOLATE PANEL
Folate: >20 ng/mL (ref >3.0)
Vitamin B-12: 791 pg/mL (ref 232–1245)

## 2021-06-14 LAB — TSH: TSH: 8.2 u[IU]/mL — ABNORMAL HIGH (ref 0.450–4.500)

## 2021-06-14 LAB — IRON AND TIBC
Iron Saturation: 41 % (ref 15–55)
Iron: 155 ug/dL (ref 38–169)
Total Iron Binding Capacity: 375 ug/dL (ref 250–450)
UIBC: 220 ug/dL (ref 111–343)

## 2021-07-01 NOTE — Progress Notes (Signed)
Call patient with update.   Confirm if patient has established with Nephrology for CKD. Also, see if established with Endocrinology for hypothyroid. If not I will need to place new referrals. Update me once you have spoken to patient.

## 2021-07-15 NOTE — Progress Notes (Signed)
Patient ID: REI MEDLEN, male    DOB: May 10, 1935  MRN: [id number]  CC: Earwax  Subjective: Frederick Kirby is a 86 y.o. male who presents for earwax. He is accompanied by his wife.   His concerns today include:  States "I have dry eardrums." Reports ongoing for years. Endorses had to get wax removed from doctor on previous occasions. Uses hairpin at home to try to get relief. Decreased hearing bilaterally. Right > left. Frequent stuffy nose. Has not heard from Endocrinology or Nephrology referral.   Patient Active Problem List   Diagnosis Date Noted   Diverticular disease of colon 07/22/2021   Diverticulosis of small intestine 07/22/2021   Anemia due to chronic blood loss 01/28/2021   Blood in feces 01/28/2021   Cholelithiasis 01/28/2021   Chronic obstructive pulmonary disease, unspecified ([place]) 01/28/2021   Gastrointestinal hemorrhage 94/76/5465   Helicobacter pylori gastrointestinal tract infection 01/28/2021   History of duodenal ulcer 03/54/6568   History of Helicobacter pylori infection 01/28/2021   Personal history of colonic polyps 01/28/2021   Right upper quadrant pain 01/28/2021   Coronary artery disease 01/28/2021   Other specified abnormal findings of blood chemistry 01/28/2021   Transaminitis 01/28/2021   Voice hoarseness 01/28/2021   Symptomatic anemia 11/10/2020   ARF (acute renal failure) ([place]) 11/10/2020   Elevated LFTs 11/10/2020   Hoarseness 11/10/2020   Carotid bruit 10/17/2014   CAD (coronary artery disease) 12/16/2012   CAD (coronary artery disease) of artery bypass graft 12/16/2012   HTN (hypertension) 12/16/2012   CKD (chronic kidney disease) 12/16/2012   Hyperlipidemia with target LDL less than 70 12/16/2012   Iliac artery aneurysm ([place]) 12/16/2012     Current Outpatient Medications on File Prior to Visit  Medication Sig Dispense Refill   amLODipine (NORVASC) 10 MG tablet TAKE 1 TABLET BY MOUTH EVERY DAY 60 tablet 1   clopidogrel (PLAVIX) 75 MG  tablet TAKE 1 TABLET BY MOUTH EVERY DAY 60 tablet 1   diphenhydramine-acetaminophen (TYLENOL PM) 25-500 MG TABS tablet Take 1 tablet by mouth at bedtime.     Ferrous Sulfate (IRON PO) Take 6.5 mg by mouth daily.     fluticasone (FLONASE) 50 MCG/ACT nasal spray Place 1 spray into both nostrils daily as needed for allergies.     FOLBIC 2.5-25-2 MG TABS tablet TAKE 1 TABLET BY MOUTH EVERY DAY 90 tablet 3   lisinopril (ZESTRIL) 2.5 MG tablet Take 2.5 mg by mouth daily.     magnesium hydroxide (MILK OF MAGNESIA) 400 MG/5ML suspension Take 15 mLs by mouth 3 (three) times a week.     metoprolol succinate (TOPROL-XL) 25 MG 24 hr tablet TAKE 1 TABLET (25 MG TOTAL) BY MOUTH DAILY. 60 tablet 1   Omega-3 Fatty Acids (FISH OIL) 1200 MG CAPS Take 1 capsule (1,200 mg total) by mouth 2 (two) times daily. (Patient taking differently: Take 1 capsule by mouth daily.) 180 capsule 3   oxymetazoline (AFRIN) 0.05 % nasal spray Place 1 spray into both nostrils once a week. (Patient not taking: Reported on 06/06/2021)     pantoprazole (PROTONIX) 40 MG tablet Take 1 tablet (40 mg total) by mouth daily. 90 tablet 3   rosuvastatin (CRESTOR) 40 MG tablet TAKE 1 TABLET BY MOUTH EVERY DAY 60 tablet 1   sodium chloride (OCEAN) 0.65 % SOLN nasal spray Place 1 spray into both nostrils daily as needed for congestion.     No current facility-administered medications on file prior to visit.    Allergies  Allergen Reactions   Niaspan [Niacin Er]     Turned red & his skin burned    Social History   Socioeconomic History   Marital status: Married    Spouse name: Not on file   Number of children: Not on file   Years of education: Not on file   Highest education level: Not on file  Occupational History   Not on file  Tobacco Use   Smoking status: Former    Types: Cigarettes    Quit date: 01/13/2005    Years since quitting: 16.5    Passive exposure: Past   Smokeless tobacco: Never  Vaping Use   Vaping Use: Never used   Substance and Sexual Activity   Alcohol use: No   Drug use: No   Sexual activity: Not Currently  Other Topics Concern   Not on file  Social History Narrative   Not on file   Social Determinants of Health   Financial Resource Strain: Not on file  Food Insecurity: Not on file  Transportation Needs: Not on file  Physical Activity: Not on file  Stress: Not on file  Social Connections: Not on file  Intimate Partner Violence: Not on file    Family History  Problem Relation Age of Onset   Cancer Mother     Past Surgical History:  Procedure Laterality Date   BIOPSY  02/26/2021   Procedure: BIOPSY;  Surgeon: Clarene Essex, MD;  Location: [facility];  Service: Endoscopy;;   CAROTID DOPPLER  12/2011   Right Bulb/Prox ICA 50-69% diameter reduction; Left Bulb/Prox ICA 0-49% diameter reduction   COLONOSCOPY WITH PROPOFOL N/A 02/26/2021   Procedure: COLONOSCOPY WITH PROPOFOL;  Surgeon: Clarene Essex, MD;  Location: [facility];  Service: Endoscopy;  Laterality: N/A;   CORONARY ANGIOPLASTY WITH STENT PLACEMENT  04/28/2003   L Cfx with 99% prox lesion in AV groove; patent SVG to ramus intermedius w/ 95% distal lesion; SVG to OM totally occluded; SVG to RCA totally occluded; native RCA occluded in prox region w/collaterals from LAD into PDA; Taxus 2.5x102m stent to distal VG to ramus (Dr. RGerrie Nordmann   CORONARY ARTERY BYPASS GRAFT  01/1992   LIMA-LAD & diagonal, SVG to diagonal, SVG to OM1 & OM2, VG to PDA & PLA (Dr. WRedmond Pulling   ESOPHAGOGASTRODUODENOSCOPY (EGD) WITH PROPOFOL N/A 02/26/2021   Procedure: ESOPHAGOGASTRODUODENOSCOPY (EGD) WITH PROPOFOL;  Surgeon: MClarene Essex MD;  Location: [facility];  Service: Endoscopy;  Laterality: N/A;   NM MYOCAR PERF WALL MOTION  11/2011   lexiscan - normal study, EF 80% post-stress   POLYPECTOMY  02/26/2021   Procedure: POLYPECTOMY;  Surgeon: MClarene Essex MD;  Location: [facility];  Service: Endoscopy;;   RENAL DOPPLER  12/2011   Right CIA with aneursymal  dilatation 2.3x3.1   TRANSTHORACIC ECHOCARDIOGRAM  10/2008   EF 50-55%, borderline conc LVH, mild posterior wall hypokinesis; mild mitral annular calcification, prolapse of anterior leaflets, mild MR; RVSP 30-479mg; mild TR; mild calcification of AV leaflets    ROS: Review of Systems Negative except as stated above  PHYSICAL EXAM: BP 126/72 (BP Location: Left Arm, Patient Position: Sitting, Cuff Size: Normal)   Pulse (!) 59   Temp 98.2 F (36.8 C)   Resp 16   Ht 5' 8.11" (1.73 m)   Wt 176 lb (79.8 kg)   SpO2 98%   BMI 26.67 kg/m   Physical Exam HENT:     Head: Normocephalic and atraumatic.     Right Ear: Tympanic membrane, ear canal  and external ear normal.     Left Ear: Tympanic membrane, ear canal and external ear normal.     Nose: Nose normal.     Mouth/Throat:     Mouth: Mucous membranes are moist.     Pharynx: Oropharynx is clear.  Eyes:     Extraocular Movements: Extraocular movements intact.     Conjunctiva/sclera: Conjunctivae normal.     Pupils: Pupils are equal, round, and reactive to light.  Cardiovascular:     Rate and Rhythm: Bradycardia present.     Pulses: Normal pulses.     Heart sounds: Normal heart sounds.  Pulmonary:     Effort: Pulmonary effort is normal.     Breath sounds: Normal breath sounds.  Musculoskeletal:     Cervical back: Normal range of motion and neck supple.  Neurological:     General: No focal deficit present.     Mental Status: He is alert and oriented to person, place, and time.  Psychiatric:        Mood and Affect: Mood normal.        Behavior: Behavior normal.    ASSESSMENT AND PLAN: 1. Decreased hearing of both ears - Referral to Audiology for further evaluation and management.  - Ambulatory referral to Audiology  2. Ceruminosis, bilateral 3. Chronic nasal congestion - Referral to ENT for further evaluation and management.  - Ambulatory referral to ENT  4. Stage 3b chronic kidney disease ([place]) - Referral to  Nephrology for further evaluation and management.  - Ambulatory referral to Nephrology  5. Hypothyroidism, unspecified type - Referral to Endocrinology for further evaluation and management. - Ambulatory referral to Endocrinology    Patient was given the opportunity to ask questions.  Patient verbalized understanding of the plan and was able to repeat key elements of the plan. Patient was given clear instructions to go to Emergency Department or return to medical center if symptoms don't improve, worsen, or new problems develop.The patient verbalized understanding.   Orders Placed This Encounter  Procedures   Ambulatory referral to Nephrology   Ambulatory referral to Endocrinology   Ambulatory referral to ENT   Ambulatory referral to Audiology    Follow-up with primary provider as scheduled.   Camillia Herter, NP

## 2021-07-22 ENCOUNTER — Ambulatory Visit (INDEPENDENT_AMBULATORY_CARE_PROVIDER_SITE_OTHER): Payer: PPO | Admitting: Family

## 2021-07-22 ENCOUNTER — Encounter: Payer: Self-pay | Admitting: Family

## 2021-07-22 VITALS — BP 126/72 | HR 59 | Temp 98.2°F | Resp 16 | Ht 68.11 in | Wt 176.0 lb

## 2021-07-22 DIAGNOSIS — H9193 Unspecified hearing loss, bilateral: Secondary | ICD-10-CM

## 2021-07-22 DIAGNOSIS — R0981 Nasal congestion: Secondary | ICD-10-CM

## 2021-07-22 DIAGNOSIS — H6123 Impacted cerumen, bilateral: Secondary | ICD-10-CM | POA: Diagnosis not present

## 2021-07-22 DIAGNOSIS — N1832 Chronic kidney disease, stage 3b: Secondary | ICD-10-CM

## 2021-07-22 DIAGNOSIS — K5730 Diverticulosis of large intestine without perforation or abscess without bleeding: Secondary | ICD-10-CM | POA: Insufficient documentation

## 2021-07-22 DIAGNOSIS — K5710 Diverticulosis of small intestine without perforation or abscess without bleeding: Secondary | ICD-10-CM | POA: Insufficient documentation

## 2021-07-22 DIAGNOSIS — E039 Hypothyroidism, unspecified: Secondary | ICD-10-CM

## 2021-07-22 NOTE — Progress Notes (Signed)
Pt presents for ear wax removal and referral to Nephrology accompanied by wife [name] which states patient does not see a kidney specialist

## 2021-08-01 ENCOUNTER — Ambulatory Visit: Payer: PPO | Attending: Audiologist | Admitting: Audiologist

## 2021-08-01 DIAGNOSIS — H903 Sensorineural hearing loss, bilateral: Secondary | ICD-10-CM | POA: Insufficient documentation

## 2021-08-01 NOTE — Procedures (Signed)
  Outpatient Audiology and [place], [place]  [id number] [phone]  AUDIOLOGICAL  EVALUATION  NAME: Frederick Kirby     DOB:   03-27-1935      MRN: [id number]                                                                                     DATE: 08/01/2021     REFERENT: Camillia Herter, NP STATUS: Outpatient DIAGNOSIS: Bilateral Sensorineural Hearing Loss    History: Rockie was seen for an audiological evaluation.  Leomar is receiving a hearing evaluation due to concerns for difficulty hearing people. Maxxwell has difficulty hearing people if they are not facing him. This difficulty began gradually. No pain or pressure reported in either ear. He is concerned that when he coughs or sneezes his nasal passages close and be cannot breath until he opens them. Tinnitus present in both ears ears. Christo denies any history of noise exposure.  Medical history negative for a condition which is a risk factor for hearing loss. No other relevant case history reported.   Evaluation:  Otoscopy showed a clear view of the tympanic membranes, bilaterally Tympanometry results were consistent with normal middle ear function, bilaterally   Audiometric testing was completed using conventional audiometry with insert and supraural transducer. Speech Recognition Thresholds were 50 dB in the right ear and 55dB in the left ear. Word Recognition was performed 40 dB SL, scored 88% in the right ear and 76% in the left ear. Pure tone thresholds show mild sloping to severe sensorineural hearing loss in each ear. Thresholds consistent with presbycusis.    Results:  Results were reviewed with Tyrone Nine. He has a mild sloping to severe high pitched sensorineural hearing loss. This is due to wear from age. Rocko feels he hears well when people face him, this is due to him lipreading high pitched speech sounds. Hearing aids are recommended to give him back access to high pitched sounds. Nilay does  not feel he struggles enough at this time to warrant aids.    Recommendations: Amplification is recommended for both ears. Hearing aids can be purchased from a variety of locations. See provided list for locations in the [place] area. Emerson feels at this time he hears well. He is not ready to pursue hearing aids at this time. Recommend annual hearing tests.    23 minutes spent testing and counseling on results.   Alfonse Alpers  Audiologist, Au.D., CCC-A 08/01/2021  3:39 PM  Cc: Camillia Herter, NP

## 2021-09-09 DIAGNOSIS — J342 Deviated nasal septum: Secondary | ICD-10-CM | POA: Insufficient documentation

## 2021-09-09 DIAGNOSIS — H903 Sensorineural hearing loss, bilateral: Secondary | ICD-10-CM | POA: Insufficient documentation

## 2021-09-12 ENCOUNTER — Other Ambulatory Visit: Payer: Self-pay | Admitting: Cardiovascular Disease

## 2021-10-25 ENCOUNTER — Other Ambulatory Visit: Payer: Self-pay | Admitting: Cardiovascular Disease

## 2021-10-26 ENCOUNTER — Other Ambulatory Visit: Payer: Self-pay | Admitting: Cardiovascular Disease

## 2021-11-17 ENCOUNTER — Other Ambulatory Visit: Payer: Self-pay | Admitting: Cardiovascular Disease

## 2021-11-21 ENCOUNTER — Other Ambulatory Visit: Payer: Self-pay | Admitting: Cardiovascular Disease

## 2021-12-01 ENCOUNTER — Other Ambulatory Visit: Payer: Self-pay | Admitting: Cardiovascular Disease

## 2021-12-18 ENCOUNTER — Other Ambulatory Visit: Payer: Self-pay | Admitting: Cardiovascular Disease

## 2021-12-19 ENCOUNTER — Encounter: Payer: Self-pay | Admitting: Family

## 2022-01-15 ENCOUNTER — Telehealth: Payer: Self-pay | Admitting: Family

## 2022-01-15 NOTE — Telephone Encounter (Signed)
Left message for patient to call back and schedule Medicare Annual Wellness Visit (AWV) either virtually or phone   Left  my Frederick Kirby number [phone]   awvi 01/13/09 per [facility]   please schedule with Nurse Health Adviser   45 min for awv-i and in office appointments 30 min for awv-s  phone/virtual appointments

## 2022-03-05 ENCOUNTER — Encounter (HOSPITAL_COMMUNITY): Payer: Self-pay

## 2022-03-05 ENCOUNTER — Emergency Department (HOSPITAL_COMMUNITY): Payer: PPO

## 2022-03-05 ENCOUNTER — Other Ambulatory Visit: Payer: Self-pay

## 2022-03-05 ENCOUNTER — Inpatient Hospital Stay (HOSPITAL_COMMUNITY)
Admission: EM | Admit: 2022-03-05 | Discharge: 2022-03-07 | DRG: 178 | Disposition: A | Discharge status: Home / Self Care | Payer: PPO | Attending: Internal Medicine | Admitting: Internal Medicine

## 2022-03-05 DIAGNOSIS — U071 COVID-19: Principal | ICD-10-CM | POA: Diagnosis present

## 2022-03-05 DIAGNOSIS — I2510 Atherosclerotic heart disease of native coronary artery without angina pectoris: Secondary | ICD-10-CM | POA: Diagnosis present

## 2022-03-05 DIAGNOSIS — M47812 Spondylosis without myelopathy or radiculopathy, cervical region: Secondary | ICD-10-CM | POA: Diagnosis present

## 2022-03-05 DIAGNOSIS — I129 Hypertensive chronic kidney disease with stage 1 through stage 4 chronic kidney disease, or unspecified chronic kidney disease: Secondary | ICD-10-CM | POA: Diagnosis present

## 2022-03-05 DIAGNOSIS — J9601 Acute respiratory failure with hypoxia: Secondary | ICD-10-CM

## 2022-03-05 DIAGNOSIS — R0902 Hypoxemia: Secondary | ICD-10-CM | POA: Diagnosis present

## 2022-03-05 DIAGNOSIS — S0003XA Contusion of scalp, initial encounter: Secondary | ICD-10-CM | POA: Diagnosis present

## 2022-03-05 DIAGNOSIS — S0081XA Abrasion of other part of head, initial encounter: Secondary | ICD-10-CM | POA: Diagnosis present

## 2022-03-05 DIAGNOSIS — R55 Syncope and collapse: Secondary | ICD-10-CM | POA: Diagnosis not present

## 2022-03-05 DIAGNOSIS — I252 Old myocardial infarction: Secondary | ICD-10-CM

## 2022-03-05 DIAGNOSIS — S50312A Abrasion of left elbow, initial encounter: Secondary | ICD-10-CM | POA: Diagnosis present

## 2022-03-05 DIAGNOSIS — Z8673 Personal history of transient ischemic attack (TIA), and cerebral infarction without residual deficits: Secondary | ICD-10-CM

## 2022-03-05 DIAGNOSIS — Z79899 Other long term (current) drug therapy: Secondary | ICD-10-CM

## 2022-03-05 DIAGNOSIS — Z951 Presence of aortocoronary bypass graft: Secondary | ICD-10-CM

## 2022-03-05 DIAGNOSIS — J449 Chronic obstructive pulmonary disease, unspecified: Secondary | ICD-10-CM | POA: Diagnosis present

## 2022-03-05 DIAGNOSIS — Z23 Encounter for immunization: Secondary | ICD-10-CM

## 2022-03-05 DIAGNOSIS — Z8719 Personal history of other diseases of the digestive system: Secondary | ICD-10-CM

## 2022-03-05 DIAGNOSIS — R7401 Elevation of levels of liver transaminase levels: Secondary | ICD-10-CM | POA: Diagnosis present

## 2022-03-05 DIAGNOSIS — Y92009 Unspecified place in unspecified non-institutional (private) residence as the place of occurrence of the external cause: Secondary | ICD-10-CM

## 2022-03-05 DIAGNOSIS — E785 Hyperlipidemia, unspecified: Secondary | ICD-10-CM | POA: Diagnosis present

## 2022-03-05 DIAGNOSIS — I951 Orthostatic hypotension: Secondary | ICD-10-CM | POA: Diagnosis not present

## 2022-03-05 DIAGNOSIS — Z7902 Long term (current) use of antithrombotics/antiplatelets: Secondary | ICD-10-CM

## 2022-03-05 DIAGNOSIS — N184 Chronic kidney disease, stage 4 (severe): Secondary | ICD-10-CM | POA: Diagnosis present

## 2022-03-05 DIAGNOSIS — Z87891 Personal history of nicotine dependence: Secondary | ICD-10-CM

## 2022-03-05 DIAGNOSIS — W01190A Fall on same level from slipping, tripping and stumbling with subsequent striking against furniture, initial encounter: Secondary | ICD-10-CM | POA: Diagnosis present

## 2022-03-05 LAB — COMPREHENSIVE METABOLIC PANEL
ALT: 61 U/L — ABNORMAL HIGH (ref 0–44)
AST: 52 U/L — ABNORMAL HIGH (ref 15–41)
Albumin: 3.7 g/dL (ref 3.5–5.0)
Alkaline Phosphatase: 74 U/L (ref 38–126)
Anion gap: 12 (ref 5–15)
BUN: 15 mg/dL (ref 8–23)
CO2: 21 mmol/L — ABNORMAL LOW (ref 22–32)
Calcium: 8.8 mg/dL — ABNORMAL LOW (ref 8.9–10.3)
Chloride: 101 mmol/L (ref 98–111)
Creatinine, Ser: 1.79 mg/dL — ABNORMAL HIGH (ref 0.61–1.24)
GFR, Estimated: 37 mL/min — ABNORMAL LOW (ref >60)
Glucose, Bld: 134 mg/dL — ABNORMAL HIGH (ref 70–99)
Potassium: 4.1 mmol/L (ref 3.5–5.1)
Sodium: 134 mmol/L — ABNORMAL LOW (ref 135–145)
Total Bilirubin: 0.9 mg/dL (ref 0.3–1.2)
Total Protein: 6.7 g/dL (ref 6.5–8.1)

## 2022-03-05 LAB — CBC WITH DIFFERENTIAL/PLATELET
Abs Immature Granulocytes: 0.04 10*3/uL (ref 0.00–0.07)
Basophils Absolute: 0 10*3/uL (ref 0.0–0.1)
Basophils Relative: 0 %
Eosinophils Absolute: 0 10*3/uL (ref 0.0–0.5)
Eosinophils Relative: 0 %
HCT: 42.1 % (ref 39.0–52.0)
Hemoglobin: 13.9 g/dL (ref 13.0–17.0)
Immature Granulocytes: 0 %
Lymphocytes Relative: 4 %
Lymphs Abs: 0.4 10*3/uL — ABNORMAL LOW (ref 0.7–4.0)
MCH: 31.4 pg (ref 26.0–34.0)
MCHC: 33 g/dL (ref 30.0–36.0)
MCV: 95 fL (ref 80.0–100.0)
Monocytes Absolute: 1 10*3/uL (ref 0.1–1.0)
Monocytes Relative: 11 %
Neutro Abs: 7.9 10*3/uL — ABNORMAL HIGH (ref 1.7–7.7)
Neutrophils Relative %: 85 %
Platelets: 162 10*3/uL (ref 150–400)
RBC: 4.43 MIL/uL (ref 4.22–5.81)
RDW: 12 % (ref 11.5–15.5)
WBC: 9.4 10*3/uL (ref 4.0–10.5)
nRBC: 0 % (ref 0.0–0.2)

## 2022-03-05 LAB — PROTIME-INR
INR: 1.1 (ref 0.8–1.2)
Prothrombin Time: 13.9 seconds (ref 11.4–15.2)

## 2022-03-05 LAB — D-DIMER, QUANTITATIVE: D-Dimer, Quant: 4.86 ug/mL-FEU — ABNORMAL HIGH (ref 0.00–0.50)

## 2022-03-05 LAB — CBC
HCT: 37.2 % — ABNORMAL LOW (ref 39.0–52.0)
Hemoglobin: 12.4 g/dL — ABNORMAL LOW (ref 13.0–17.0)
MCH: 32.1 pg (ref 26.0–34.0)
MCHC: 33.3 g/dL (ref 30.0–36.0)
MCV: 96.4 fL (ref 80.0–100.0)
Platelets: 150 10*3/uL (ref 150–400)
RBC: 3.86 MIL/uL — ABNORMAL LOW (ref 4.22–5.81)
RDW: 12.3 % (ref 11.5–15.5)
WBC: 7.3 10*3/uL (ref 4.0–10.5)
nRBC: 0 % (ref 0.0–0.2)

## 2022-03-05 LAB — RESP PANEL BY RT-PCR (RSV, FLU A&B, COVID)  RVPGX2
Influenza A by PCR: NEGATIVE
Influenza B by PCR: NEGATIVE
Resp Syncytial Virus by PCR: NEGATIVE
SARS Coronavirus 2 by RT PCR: POSITIVE — AB

## 2022-03-05 LAB — URINALYSIS, ROUTINE W REFLEX MICROSCOPIC
Bilirubin Urine: NEGATIVE
Glucose, UA: NEGATIVE mg/dL
Ketones, ur: NEGATIVE mg/dL
Leukocytes,Ua: NEGATIVE
Nitrite: NEGATIVE
Protein, ur: 30 mg/dL — AB
Specific Gravity, Urine: 1.014 (ref 1.005–1.030)
pH: 7 (ref 5.0–8.0)

## 2022-03-05 LAB — CREATININE, SERUM
Creatinine, Ser: 1.72 mg/dL — ABNORMAL HIGH (ref 0.61–1.24)
GFR, Estimated: 39 mL/min — ABNORMAL LOW (ref >60)

## 2022-03-05 LAB — LACTIC ACID, PLASMA: Lactic Acid, Venous: 1.5 mmol/L (ref 0.5–1.9)

## 2022-03-05 LAB — PROCALCITONIN: Procalcitonin: 0.15 ng/mL

## 2022-03-05 MED ORDER — DEXAMETHASONE 6 MG PO TABS
6.0000 mg | ORAL_TABLET | ORAL | Status: DC
  Administered 2022-03-06: 6 mg via ORAL
  Filled 2022-03-05 (×2): qty 1

## 2022-03-05 MED ORDER — ACETAMINOPHEN 500 MG PO TABS
1000.0000 mg | ORAL_TABLET | Freq: Once | ORAL | Status: AC
  Administered 2022-03-05: 1000 mg via ORAL
  Filled 2022-03-05: qty 2

## 2022-03-05 MED ORDER — SENNOSIDES-DOCUSATE SODIUM 8.6-50 MG PO TABS: 1.0000 | ORAL_TABLET | Freq: Every evening | ORAL | Status: DC | PRN

## 2022-03-05 MED ORDER — BISMUTH SUBSALICYLATE 262 MG/15ML PO SUSP: 30.0000 mL | ORAL | Status: DC | PRN

## 2022-03-05 MED ORDER — CLOPIDOGREL BISULFATE 75 MG PO TABS
75.0000 mg | ORAL_TABLET | Freq: Every day | ORAL | Status: DC
  Administered 2022-03-06 – 2022-03-07 (×2): 75 mg via ORAL
  Filled 2022-03-05 (×2): qty 1

## 2022-03-05 MED ORDER — DEXAMETHASONE SODIUM PHOSPHATE 10 MG/ML IJ SOLN
10.0000 mg | Freq: Once | INTRAMUSCULAR | Status: AC
  Administered 2022-03-05: 10 mg via INTRAVENOUS
  Filled 2022-03-05: qty 1

## 2022-03-05 MED ORDER — SODIUM CHLORIDE 0.9 % IV SOLN
200.0000 mg | Freq: Once | INTRAVENOUS | Status: AC
  Administered 2022-03-06: 200 mg via INTRAVENOUS
  Filled 2022-03-05: qty 40

## 2022-03-05 MED ORDER — TETANUS-DIPHTH-ACELL PERTUSSIS 5-2.5-18.5 LF-MCG/0.5 IM SUSY
0.5000 mL | PREFILLED_SYRINGE | Freq: Once | INTRAMUSCULAR | Status: AC
  Administered 2022-03-05: 0.5 mL via INTRAMUSCULAR
  Filled 2022-03-05: qty 0.5

## 2022-03-05 MED ORDER — ONDANSETRON HCL 4 MG/2ML IJ SOLN: 4.0000 mg | Freq: Four times a day (QID) | INTRAMUSCULAR | Status: DC | PRN

## 2022-03-05 MED ORDER — SODIUM CHLORIDE 0.9 % IV BOLUS
1000.0000 mL | Freq: Once | INTRAVENOUS | Status: AC
  Administered 2022-03-05: 1000 mL via INTRAVENOUS

## 2022-03-05 MED ORDER — SODIUM CHLORIDE 0.9 % IV SOLN: INTRAVENOUS | Status: AC

## 2022-03-05 MED ORDER — PANTOPRAZOLE SODIUM 40 MG PO TBEC
40.0000 mg | DELAYED_RELEASE_TABLET | Freq: Every day | ORAL | Status: DC
  Administered 2022-03-06 – 2022-03-07 (×2): 40 mg via ORAL
  Filled 2022-03-05 (×2): qty 1

## 2022-03-05 MED ORDER — ENOXAPARIN SODIUM 40 MG/0.4ML IJ SOSY
40.0000 mg | PREFILLED_SYRINGE | INTRAMUSCULAR | Status: DC
  Administered 2022-03-05 – 2022-03-06 (×2): 40 mg via SUBCUTANEOUS
  Filled 2022-03-05 (×2): qty 0.4

## 2022-03-05 MED ORDER — ACETAMINOPHEN 650 MG RE SUPP: 650.0000 mg | Freq: Four times a day (QID) | RECTAL | Status: DC | PRN

## 2022-03-05 MED ORDER — NIRMATRELVIR/RITONAVIR (PAXLOVID) TABLET (RENAL DOSING): 2.0000 | ORAL_TABLET | Freq: Two times a day (BID) | ORAL | Status: DC

## 2022-03-05 MED ORDER — GUAIFENESIN-DM 100-10 MG/5ML PO SYRP: 5.0000 mL | ORAL_SOLUTION | ORAL | Status: DC | PRN

## 2022-03-05 MED ORDER — ONDANSETRON HCL 4 MG PO TABS: 4.0000 mg | ORAL_TABLET | Freq: Four times a day (QID) | ORAL | Status: DC | PRN

## 2022-03-05 MED ORDER — SODIUM CHLORIDE 0.9 % IV SOLN
100.0000 mg | Freq: Every day | INTRAVENOUS | Status: DC
  Administered 2022-03-06: 100 mg via INTRAVENOUS
  Filled 2022-03-05: qty 20

## 2022-03-05 MED ORDER — ACETAMINOPHEN 325 MG PO TABS
650.0000 mg | ORAL_TABLET | Freq: Four times a day (QID) | ORAL | Status: DC | PRN
  Administered 2022-03-06: 650 mg via ORAL
  Filled 2022-03-05: qty 2

## 2022-03-05 MED ORDER — ALBUTEROL SULFATE (2.5 MG/3ML) 0.083% IN NEBU: 2.5000 mg | INHALATION_SOLUTION | RESPIRATORY_TRACT | Status: DC | PRN

## 2022-03-05 MED ORDER — METOPROLOL TARTRATE 5 MG/5ML IV SOLN: 5.0000 mg | Freq: Four times a day (QID) | INTRAVENOUS | Status: DC | PRN

## 2022-03-05 MED ORDER — ROSUVASTATIN CALCIUM 20 MG PO TABS
40.0000 mg | ORAL_TABLET | Freq: Every day | ORAL | Status: DC
  Administered 2022-03-06 – 2022-03-07 (×2): 40 mg via ORAL
  Filled 2022-03-05 (×2): qty 2

## 2022-03-05 NOTE — H&P (Signed)
PCP:   Camillia Herter, NP   Chief Complaint:  Fall  HPI: This is a 87 year old male with past medical history of coronary artery disease, COPD, hypertension, hyperlipidemia, CKD stage IIIb and a remote history of a stroke.  Yesterday he called his wife and told her he had sudden onset of URI symptoms.  He has a severe sore throat, he was coughing he was very weak.  At the time his wife got home he was very congested.  He had a fever but no nausea, vomiting, diarrhea, myalgia or significant confusion.  Per patien, he went to bed.  He woke this morning, was tested and COVID positive.  He was given medications for the flu.  Later he got out of bed, on standing he was very dizzy.  He states he fell through the doorway and fell against a sofa, he hit his head on the right side.  He could not get up.  He had to call his grandsons and even they had a hard time getting up.  When his wife got home, he was in bed.  Later he attempted to go to the restroom, he stood and had to immediately sit.  He was very weak and dizzy.  He was brought to the ER.  In the ER, patient is very very weak.  He tested COVID positive.  He was hypoxic to the low 90s and was placed on 2 L oxygen.  He was also orthostatic.  Review of Systems:  The patient denies anorexia, fever, weight loss,, vision loss, decreased hearing, hoarseness, chest pain, syncope, dyspnea on exertion, peripheral edema, balance deficits, hemoptysis, abdominal pain, melena, hematochezia, severe indigestion/heartburn, hematuria, incontinence, genital sores, muscle weakness, suspicious skin lesions, transient blindness, difficulty walking, depression, unusual weight change, abnormal bleeding, enlarged lymph nodes, angioedema, and breast masses.  Past Medical History: Past Medical History:  Diagnosis Date   COPD (chronic obstructive pulmonary disease) ([place])    Coronary artery disease    Hyperlipidemia    Hypertension    Mild renal insufficiency    Myocardial  infarction ([place])    S/P CABG (coronary artery bypass graft) 1994   Stroke [facility])    Past Surgical History:  Procedure Laterality Date   BIOPSY  02/26/2021   Procedure: BIOPSY;  Surgeon: Clarene Essex, MD;  Location: [facility];  Service: Endoscopy;;   CAROTID DOPPLER  12/2011   Right Bulb/Prox ICA 50-69% diameter reduction; Left Bulb/Prox ICA 0-49% diameter reduction   COLONOSCOPY WITH PROPOFOL N/A 02/26/2021   Procedure: COLONOSCOPY WITH PROPOFOL;  Surgeon: Clarene Essex, MD;  Location: [facility];  Service: Endoscopy;  Laterality: N/A;   CORONARY ANGIOPLASTY WITH STENT PLACEMENT  04/28/2003   L Cfx with 99% prox lesion in AV groove; patent SVG to ramus intermedius w/ 95% distal lesion; SVG to OM totally occluded; SVG to RCA totally occluded; native RCA occluded in prox region w/collaterals from LAD into PDA; Taxus 2.5x74m stent to distal VG to ramus (Dr. RGerrie Nordmann   CORONARY ARTERY BYPASS GRAFT  01/1992   LIMA-LAD & diagonal, SVG to diagonal, SVG to OM1 & OM2, VG to PDA & PLA (Dr. WRedmond Pulling   ESOPHAGOGASTRODUODENOSCOPY (EGD) WITH PROPOFOL N/A 02/26/2021   Procedure: ESOPHAGOGASTRODUODENOSCOPY (EGD) WITH PROPOFOL;  Surgeon: MClarene Essex MD;  Location: [facility];  Service: Endoscopy;  Laterality: N/A;   NM MYOCAR PERF WALL MOTION  11/2011   lexiscan - normal study, EF 80% post-stress   POLYPECTOMY  02/26/2021   Procedure: POLYPECTOMY;  Surgeon: MClarene Essex  MD;  Location: [facility];  Service: Endoscopy;;   RENAL DOPPLER  12/2011   Right CIA with aneursymal dilatation 2.3x3.1   TRANSTHORACIC ECHOCARDIOGRAM  10/2008   EF 50-55%, borderline conc LVH, mild posterior wall hypokinesis; mild mitral annular calcification, prolapse of anterior leaflets, mild MR; RVSP 30-10mHg; mild TR; mild calcification of AV leaflets    Medications: Prior to Admission medications   Medication Sig Start Date End Date Taking? Authorizing Provider  amLODipine (NORVASC) 10 MG tablet TAKE 1 TABLET BY MOUTH EVERY  DAY 09/12/21   KTroy Sine MD  clopidogrel (PLAVIX) 75 MG tablet Take 1 tablet (75 mg total) by mouth daily. 12/19/21   KTroy Sine MD  diphenhydramine-acetaminophen (TYLENOL PM) 25-500 MG TABS tablet Take 1 tablet by mouth at bedtime.    [provider]  Ferrous Sulfate (IRON PO) Take 6.5 mg by mouth daily.    [provider]  fluticasone (FLONASE) 50 MCG/ACT nasal spray Place 1 spray into both nostrils daily as needed for allergies.    [provider]  FOLBIC 2.5-25-2 MG TABS tablet TAKE 1 TABLET BY MOUTH EVERY DAY 06/17/21   KTroy Sine MD  lisinopril (ZESTRIL) 2.5 MG tablet TAKE 1 TABLET BY MOUTH EVERY DAY 09/12/21   KTroy Sine MD  magnesium hydroxide (MILK OF MAGNESIA) 400 MG/5ML suspension Take 15 mLs by mouth 3 (three) times a week.    [provider]  metoprolol succinate (TOPROL-XL) 25 MG 24 hr tablet TAKE 1 TABLET (25 MG TOTAL) BY MOUTH DAILY. 10/28/21   KTroy Sine MD  Omega-3 Fatty Acids (FISH OIL) 1200 MG CAPS Take 1 capsule (1,200 mg total) by mouth 2 (two) times daily. Patient taking differently: Take 1 capsule by mouth daily. 03/05/20   KTroy Sine MD  oxymetazoline (AFRIN) 0.05 % nasal spray Place 1 spray into both nostrils once a week. Patient not taking: Reported on 06/06/2021    [provider]  pantoprazole (PROTONIX) 40 MG tablet Take 1 tablet (40 mg total) by mouth daily. 03/05/20   KTroy Sine MD  rosuvastatin (CRESTOR) 40 MG tablet TAKE 1 TABLET BY MOUTH EVERY DAY 09/12/21   KTroy Sine MD  sodium chloride (OCEAN) 0.65 % SOLN nasal spray Place 1 spray into both nostrils daily as needed for congestion.    [provider]    Allergies:   Allergies  Allergen Reactions   Niaspan [Niacin Er]     Turned red & his skin burned    Social History:  reports that he quit smoking about 17 years ago. His smoking use included cigarettes. He has been exposed to tobacco smoke. He has never used  smokeless tobacco. He reports that he does not drink alcohol and does not use drugs.  Family History: Family History  Problem Relation Age of Onset   Cancer Mother     Physical Exam: Vitals:   03/05/22 1845 03/05/22 1900 03/05/22 1915 03/05/22 1926  BP: 94/73 (!) 112/56 (!) 110/50   Pulse: 86 80 66   Resp: 17 (!) 24 15   Temp:    98.7 F (37.1 C)  TempSrc:    Oral  SpO2: 98% 98% 97%     General:  Alert and oriented times three, well developed and nourished, no acute distress.  Very very weak gentleman Eyes: PERRLA, pink conjunctiva, no scleral icterus ENT: Dry oral mucosa, neck supple, no thyromegaly, large bruise right forehead Lungs: clear to ascultation, no wheeze, no crackles,  no use of accessory muscles Cardiovascular: regular rate and rhythm, no regurgitation, no gallops, no murmurs. No carotid bruits, no JVD Abdomen: soft, positive BS, non-tender, non-distended, no organomegaly, not an acute abdomen GU: not examined Neuro: CN II - XII grossly intact, sensation intact Musculoskeletal: strength 5/5 all extremities, no clubbing, cyanosis or edema Skin: no rash, no subcutaneous crepitation, no decubitus Psych: appropriate patient   Labs on Admission:  Recent Labs    03/05/22 1640  NA 134*  K 4.1  CL 101  CO2 21*  GLUCOSE 134*  BUN 15  CREATININE 1.79*  CALCIUM 8.8*   Recent Labs    03/05/22 1640  AST 52*  ALT 61*  ALKPHOS 74  BILITOT 0.9  PROT 6.7  ALBUMIN 3.7    Recent Labs    03/05/22 1640  WBC 9.4  NEUTROABS 7.9*  HGB 13.9  HCT 42.1  MCV 95.0  PLT 162     Micro Results: Recent Results (from the past 240 hour(s))  Resp panel by RT-PCR (RSV, Flu A&B, Covid) Anterior Nasal Swab     Status: Abnormal   Collection Time: 03/05/22  4:54 PM   Specimen: Anterior Nasal Swab  Result Value Ref Range Status   SARS Coronavirus 2 by RT PCR POSITIVE (A) NEGATIVE Final   Influenza A by PCR NEGATIVE NEGATIVE Final   Influenza B by PCR NEGATIVE NEGATIVE  Final    Comment: (NOTE) The Xpert Xpress SARS-CoV-2/FLU/RSV plus assay is intended as an aid in the diagnosis of influenza from Nasopharyngeal swab specimens and should not be used as a sole basis for treatment. Nasal washings and aspirates are unacceptable for Xpert Xpress SARS-CoV-2/FLU/RSV testing.  Fact Sheet for Patients: EntrepreneurPulse.com.au  Fact Sheet for Healthcare Providers: IncredibleEmployment.be  This test is not yet approved or cleared by the [place] FDA and has been authorized for detection and/or diagnosis of SARS-CoV-2 by FDA under an Emergency Use Authorization (EUA). This EUA will remain in effect (meaning this test can be used) for the duration of the COVID-19 declaration under Section 564(b)(1) of the Act, 21 U.S.C. section 360bbb-3(b)(1), unless the authorization is terminated or revoked.     Resp Syncytial Virus by PCR NEGATIVE NEGATIVE Final    Comment: (NOTE) Fact Sheet for Patients: EntrepreneurPulse.com.au  Fact Sheet for Healthcare Providers: IncredibleEmployment.be  This test is not yet approved or cleared by the [place] FDA and has been authorized for detection and/or diagnosis of SARS-CoV-2 by FDA under an Emergency Use Authorization (EUA). This EUA will remain in effect (meaning this test can be used) for the duration of the COVID-19 declaration under Section 564(b)(1) of the Act, 21 U.S.C. section 360bbb-3(b)(1), unless the authorization is terminated or revoked.  Performed at [facility], [place] [address] [place], [place] [address]      Radiological Exams on Admission: CT CERVICAL SPINE WO CONTRAST  Result Date: 03/05/2022 CLINICAL DATA:  [place] out of bed, dizziness, right forehead injury, anticoagulated EXAM: CT CERVICAL SPINE WITHOUT CONTRAST TECHNIQUE: Multidetector CT imaging of the cervical spine was performed without intravenous contrast.  Multiplanar CT image reconstructions were also generated. RADIATION DOSE REDUCTION: This exam was performed according to the departmental dose-optimization program which includes automated exposure control, adjustment of the mA and/or kV according to patient size and/or use of iterative reconstruction technique. COMPARISON:  None Available. FINDINGS: Alignment: Alignment is anatomic. Skull base and vertebrae: No acute fracture. No primary bone lesion or focal pathologic process. Soft tissues and spinal canal: No  prevertebral fluid or swelling. No visible canal hematoma. Disc levels: Prominent C5-6 spondylosis, with right greater than left neural foraminal encroachment. Remaining disc spaces are well preserved. Upper chest: Airway is patent. Emphysematous changes at the lung apices. Other: Reconstructed images demonstrate no additional findings. IMPRESSION: 1. No acute cervical spine fracture. 2. Prominent C5-6 spondylosis. Electronically Signed   By: Randa Ngo M.D.   On: 03/05/2022 18:27   CT HEAD WO CONTRAST  Result Date: 03/05/2022 CLINICAL DATA:  [place], dizziness, right forehead injury, anticoagulated EXAM: CT HEAD WITHOUT CONTRAST TECHNIQUE: Contiguous axial images were obtained from the base of the skull through the vertex without intravenous contrast. RADIATION DOSE REDUCTION: This exam was performed according to the departmental dose-optimization program which includes automated exposure control, adjustment of the mA and/or kV according to patient size and/or use of iterative reconstruction technique. COMPARISON:  06/06/2003 FINDINGS: Brain: Chronic right frontal cortical infarct. No acute infarct or hemorrhage. Lateral ventricles and midline structures are unremarkable. No acute extra-axial fluid collections. No mass effect. Vascular: No hyperdense vessel or unexpected calcification. Skull: Normal. Negative for fracture or focal lesion. Minimal right frontal scalp hematoma. Sinuses/Orbits: Mild  mucoperiosteal thickening throughout the ethmoid air cells and sphenoid sinus. Remaining paranasal sinuses are clear. Other: None. IMPRESSION: 1. Small right frontal scalp hematoma.  No underlying fracture. 2. No acute intracranial process. 3. Chronic right frontal cortical infarct. Electronically Signed   By: Randa Ngo M.D.   On: 03/05/2022 18:26   DG Elbow Complete Left  Result Date: 03/05/2022 CLINICAL DATA:  Blunt trauma due to a fall. EXAM: LEFT ELBOW - COMPLETE 3+ VIEW COMPARISON:  None Available. FINDINGS: There is no evidence of fracture, dislocation, or joint effusion. There is no evidence of arthropathy or other focal bone abnormality. Soft tissues are unremarkable. IMPRESSION: Negative. Electronically Signed   By: Lucienne Capers M.D.   On: 03/05/2022 17:28   DG Chest Port 1 View  Result Date: 03/05/2022 CLINICAL DATA:  Trauma due to a fall. Positive COVID test this morning. EXAM: PORTABLE CHEST 1 VIEW COMPARISON:  12/02/2020 FINDINGS: Postoperative changes in the mediastinum. Heart size and pulmonary vascularity are normal for technique. Shallow inspiration. Lungs are clear. No pleural effusions. No pneumothorax. Mediastinal contours appear intact. IMPRESSION: No active disease. Electronically Signed   By: Lucienne Capers M.D.   On: 03/05/2022 17:27    Assessment/Plan Present on Admission:  COVID-19 virus infection  Orthostatic hypotension -Start remdesivir.  Patient not hypoxic but given his many comorbidities including age, CKD stage IIIb, CAD, COPD, patient is at high risk for severe COVID infections.  Also continue Decadron as started in the ER. -Gentle IV fluid hydration, given patient's orthostatic hypotension.  For the next 10 hours. -Oxygen to keep sats greater than 88% or greater, continuous pulse ox, telemetry -COVID labs including D-dimer, ESR, fibrinogen, LDH -Holding blood pressure medications of Norvasc, metoprolol and lisinopril.  Fall, initial encounter Near  syncope -Abrasion right forehead.  No loss of consciousness -CT head and spine negative -PT consult in a.m.   CKD (chronic kidney disease) stage 4, GFR 15-29 ml/min (HCC) -Stable at baseline.  Avoid nephrotoxic medication   CAD (coronary artery disease) -Holding blood pressure medications of Norvasc, metoprolol and lisinopril. -Orthostatic vitals in a.m., resume meds based on orthostatic vitals.   Chronic obstructive pulmonary disease, unspecified ([place]) -Stable at baseline.  Albuterol MDI as needed ordered   Transaminitis -Chronic, monitor.  CMP in a.m.  Aryiah Monterosso 03/05/2022, 7:30 PM

## 2022-03-05 NOTE — ED Triage Notes (Signed)
Pt BIB [facility] from home d/t a fall. Pt started feeling sick yesterday and was tested positive for covid w the home kit this morning. Pt sat up really fast on the bed trying to go the bathroom, got dizzy and  when attempting getting up, pt fell on the floor. Right forehead  and L elbow injury noted. C-collar applied by EMS. Pt is on Plavix. Pt did lose consciousness.   97% RA Cbg 134 128/86 96%

## 2022-03-05 NOTE — ED Notes (Signed)
ED TO INPATIENT HANDOFF REPORT  ED Nurse Name and Phone #: [phone] Binta Statzer Belenda Cruise  S Name/Age/Gender Frederick Kirby 87 y.o. male Room/Bed: 015C/015C  Code Status   Code Status: Full Code  Home/SNF/Other Home Patient oriented to: self, place, time, and situation Is this baseline? Yes   Triage Complete: Triage complete  Chief Complaint COVID-19 virus infection [U07.1]  Triage Note Pt BIB [facility] from home d/t a fall. Pt started feeling sick yesterday and was tested positive for covid w the home kit this morning. Pt sat up really fast on the bed trying to go the bathroom, got dizzy and  when attempting getting up, pt fell on the floor. Right forehead  and L elbow injury noted. C-collar applied by EMS. Pt is on Plavix. Pt did lose consciousness.   97% RA Cbg 134 128/86 96%    Allergies Allergies  Allergen Reactions   Niaspan [Niacin Er]     Turned red & his skin burned    Level of Care/Admitting Diagnosis ED Disposition     ED Disposition  Admit   Condition  --   Comment  Hospital Area: [place] [[id number]]  Level of Care: Telemetry Medical [104]  May place patient in observation at [facility] or [place] if equivalent level of care is available:: Yes  Covid Evaluation: Confirmed COVID Positive  Diagnosis: COVID-19 virus infection [id number]  Admitting Physician: Irene, [place]  Attending Physician: Haywood Pao          B Medical/Surgery History Past Medical History:  Diagnosis Date   COPD (chronic obstructive pulmonary disease) ([place])    Coronary artery disease    Hyperlipidemia    Hypertension    Mild renal insufficiency    Myocardial infarction ([place])    S/P CABG (coronary artery bypass graft) 1994   Stroke [facility])    Past Surgical History:  Procedure Laterality Date   BIOPSY  02/26/2021   Procedure: BIOPSY;  Surgeon: Clarene Essex, MD;  Location: [facility];  Service: Endoscopy;;   CAROTID DOPPLER  12/2011   Right  Bulb/Prox ICA 50-69% diameter reduction; Left Bulb/Prox ICA 0-49% diameter reduction   COLONOSCOPY WITH PROPOFOL N/A 02/26/2021   Procedure: COLONOSCOPY WITH PROPOFOL;  Surgeon: Clarene Essex, MD;  Location: [facility];  Service: Endoscopy;  Laterality: N/A;   CORONARY ANGIOPLASTY WITH STENT PLACEMENT  04/28/2003   L Cfx with 99% prox lesion in AV groove; patent SVG to ramus intermedius w/ 95% distal lesion; SVG to OM totally occluded; SVG to RCA totally occluded; native RCA occluded in prox region w/collaterals from LAD into PDA; Taxus 2.5x51m stent to distal VG to ramus (Dr. RGerrie Nordmann   CORONARY ARTERY BYPASS GRAFT  01/1992   LIMA-LAD & diagonal, SVG to diagonal, SVG to OM1 & OM2, VG to PDA & PLA (Dr. WRedmond Pulling   ESOPHAGOGASTRODUODENOSCOPY (EGD) WITH PROPOFOL N/A 02/26/2021   Procedure: ESOPHAGOGASTRODUODENOSCOPY (EGD) WITH PROPOFOL;  Surgeon: MClarene Essex MD;  Location: [facility];  Service: Endoscopy;  Laterality: N/A;   NM MYOCAR PERF WALL MOTION  11/2011   lexiscan - normal study, EF 80% post-stress   POLYPECTOMY  02/26/2021   Procedure: POLYPECTOMY;  Surgeon: MClarene Essex MD;  Location: [facility];  Service: Endoscopy;;   RENAL DOPPLER  12/2011   Right CIA with aneursymal dilatation 2.3x3.1   TRANSTHORACIC ECHOCARDIOGRAM  10/2008   EF 50-55%, borderline conc LVH, mild posterior wall hypokinesis; mild mitral annular calcification, prolapse of anterior leaflets, mild MR; RVSP 30-424mg; mild  TR; mild calcification of AV leaflets     A IV Location/Drains/Wounds Patient Lines/Drains/Airways Status     Active Line/Drains/Airways     Name Placement date Placement time Site Days   Peripheral IV 11/10/20 20 G Right Antecubital 11/10/20  --  Antecubital  480   Peripheral IV 03/05/22 20 G Posterior;Left Wrist 03/05/22  --  Wrist  less than 1            Intake/Output Last 24 hours  Intake/Output Summary (Last 24 hours) at 03/05/2022 2019 Last data filed at 03/05/2022 1926 Gross per  24 hour  Intake 715.95 ml  Output --  Net 715.95 ml    Labs/Imaging Results for orders placed or performed during the hospital encounter of 03/05/22 (from the past 48 hour(s))  Comprehensive metabolic panel     Status: Abnormal   Collection Time: 03/05/22  4:40 PM  Result Value Ref Range   Sodium 134 (L) 135 - 145 mmol/L   Potassium 4.1 3.5 - 5.1 mmol/L   Chloride 101 98 - 111 mmol/L   CO2 21 (L) 22 - 32 mmol/L   Glucose, Bld 134 (H) 70 - 99 mg/dL    Comment: Glucose reference range applies only to samples taken after fasting for at least 8 hours.   BUN 15 8 - 23 mg/dL   Creatinine, Ser 1.79 (H) 0.61 - 1.24 mg/dL   Calcium 8.8 (L) 8.9 - 10.3 mg/dL   Total Protein 6.7 6.5 - 8.1 g/dL   Albumin 3.7 3.5 - 5.0 g/dL   AST 52 (H) 15 - 41 U/L   ALT 61 (H) 0 - 44 U/L   Alkaline Phosphatase 74 38 - 126 U/L   Total Bilirubin 0.9 0.3 - 1.2 mg/dL   GFR, Estimated 37 (L) >60 mL/min    Comment: (NOTE) Calculated using the CKD-EPI Creatinine Equation (2021)    Anion gap 12 5 - 15    Comment: Performed at [facility], [place] [address] [place], [place] [address]  Protime-INR     Status: None   Collection Time: 03/05/22  4:40 PM  Result Value Ref Range   Prothrombin Time 13.9 11.4 - 15.2 seconds   INR 1.1 0.8 - 1.2    Comment: (NOTE) INR goal varies based on device and disease states. Performed at [facility], [place] [address] [place], [place] [address]   CBC WITH DIFFERENTIAL     Status: Abnormal   Collection Time: 03/05/22  4:40 PM  Result Value Ref Range   WBC 9.4 4.0 - 10.5 K/uL   RBC 4.43 4.22 - 5.81 MIL/uL   Hemoglobin 13.9 13.0 - 17.0 g/dL   HCT 42.1 39.0 - 52.0 %   MCV 95.0 80.0 - 100.0 fL   MCH 31.4 26.0 - 34.0 pg   MCHC 33.0 30.0 - 36.0 g/dL   RDW 12.0 11.5 - 15.5 %   Platelets 162 150 - 400 K/uL   nRBC 0.0 0.0 - 0.2 %   Neutrophils Relative % 85 %   Neutro Abs 7.9 (H) 1.7 - 7.7 K/uL   Lymphocytes Relative 4 %   Lymphs Abs 0.4 (L) 0.7 - 4.0 K/uL    Monocytes Relative 11 %   Monocytes Absolute 1.0 0.1 - 1.0 K/uL   Eosinophils Relative 0 %   Eosinophils Absolute 0.0 0.0 - 0.5 K/uL   Basophils Relative 0 %   Basophils Absolute 0.0 0.0 - 0.1 K/uL   Immature Granulocytes 0 %  Abs Immature Granulocytes 0.04 0.00 - 0.07 K/uL    Comment: Performed at [facility], [place] [address], [place], [place] [address]  Urinalysis, Routine w reflex microscopic -Urine, Clean Catch     Status: Abnormal   Collection Time: 03/05/22  4:47 PM  Result Value Ref Range   Color, Urine YELLOW YELLOW   APPearance CLEAR CLEAR   Specific Gravity, Urine 1.014 1.005 - 1.030   pH 7.0 5.0 - 8.0   Glucose, UA NEGATIVE NEGATIVE mg/dL   Hgb urine dipstick SMALL (A) NEGATIVE   Bilirubin Urine NEGATIVE NEGATIVE   Ketones, ur NEGATIVE NEGATIVE mg/dL   Protein, ur 30 (A) NEGATIVE mg/dL   Nitrite NEGATIVE NEGATIVE   Leukocytes,Ua NEGATIVE NEGATIVE   RBC / HPF 0-5 0 - 5 RBC/hpf   WBC, UA 0-5 0 - 5 WBC/hpf   Bacteria, UA RARE (A) NONE SEEN   Squamous Epithelial / HPF 0-5 0 - 5 /HPF   Mucus PRESENT    Hyaline Casts, UA PRESENT     Comment: Performed at [facility], [address], [place], [place] [address]  Resp panel by RT-PCR (RSV, Flu A&B, Covid) Anterior Nasal Swab     Status: Abnormal   Collection Time: 03/05/22  4:54 PM   Specimen: Anterior Nasal Swab  Result Value Ref Range   SARS Coronavirus 2 by RT PCR POSITIVE (A) NEGATIVE   Influenza A by PCR NEGATIVE NEGATIVE   Influenza B by PCR NEGATIVE NEGATIVE    Comment: (NOTE) The Xpert Xpress SARS-CoV-2/FLU/RSV plus assay is intended as an aid in the diagnosis of influenza from Nasopharyngeal swab specimens and should not be used as a sole basis for treatment. Nasal washings and aspirates are unacceptable for Xpert Xpress SARS-CoV-2/FLU/RSV testing.  Fact Sheet for Patients: EntrepreneurPulse.com.au  Fact Sheet for Healthcare  Providers: IncredibleEmployment.be  This test is not yet approved or cleared by the [place] FDA and has been authorized for detection and/or diagnosis of SARS-CoV-2 by FDA under an Emergency Use Authorization (EUA). This EUA will remain in effect (meaning this test can be used) for the duration of the COVID-19 declaration under Section 564(b)(1) of the Act, 21 U.S.C. section 360bbb-3(b)(1), unless the authorization is terminated or revoked.     Resp Syncytial Virus by PCR NEGATIVE NEGATIVE    Comment: (NOTE) Fact Sheet for Patients: EntrepreneurPulse.com.au  Fact Sheet for Healthcare Providers: IncredibleEmployment.be  This test is not yet approved or cleared by the [place] FDA and has been authorized for detection and/or diagnosis of SARS-CoV-2 by FDA under an Emergency Use Authorization (EUA). This EUA will remain in effect (meaning this test can be used) for the duration of the COVID-19 declaration under Section 564(b)(1) of the Act, 21 U.S.C. section 360bbb-3(b)(1), unless the authorization is terminated or revoked.  Performed at [facility], [place] [address], [place], [place] [address]   Lactic acid, plasma     Status: None   Collection Time: 03/05/22  4:58 PM  Result Value Ref Range   Lactic Acid, Venous 1.5 0.5 - 1.9 mmol/L    Comment: Performed at [place] [address], [place], [place] [address]   CT CERVICAL SPINE WO CONTRAST  Result Date: 03/05/2022 CLINICAL DATA:  [place] out of bed, dizziness, right forehead injury, anticoagulated EXAM: CT CERVICAL SPINE WITHOUT CONTRAST TECHNIQUE: Multidetector CT imaging of the cervical spine was performed without intravenous contrast. Multiplanar CT image reconstructions were also generated. RADIATION DOSE REDUCTION: This  exam was performed according to the departmental dose-optimization program which includes automated exposure control, adjustment  of the mA and/or kV according to patient size and/or use of iterative reconstruction technique. COMPARISON:  None Available. FINDINGS: Alignment: Alignment is anatomic. Skull base and vertebrae: No acute fracture. No primary bone lesion or focal pathologic process. Soft tissues and spinal canal: No prevertebral fluid or swelling. No visible canal hematoma. Disc levels: Prominent C5-6 spondylosis, with right greater than left neural foraminal encroachment. Remaining disc spaces are well preserved. Upper chest: Airway is patent. Emphysematous changes at the lung apices. Other: Reconstructed images demonstrate no additional findings. IMPRESSION: 1. No acute cervical spine fracture. 2. Prominent C5-6 spondylosis. Electronically Signed   By: Randa Ngo M.D.   On: 03/05/2022 18:27   CT HEAD WO CONTRAST  Result Date: 03/05/2022 CLINICAL DATA:  [place], dizziness, right forehead injury, anticoagulated EXAM: CT HEAD WITHOUT CONTRAST TECHNIQUE: Contiguous axial images were obtained from the base of the skull through the vertex without intravenous contrast. RADIATION DOSE REDUCTION: This exam was performed according to the departmental dose-optimization program which includes automated exposure control, adjustment of the mA and/or kV according to patient size and/or use of iterative reconstruction technique. COMPARISON:  06/06/2003 FINDINGS: Brain: Chronic right frontal cortical infarct. No acute infarct or hemorrhage. Lateral ventricles and midline structures are unremarkable. No acute extra-axial fluid collections. No mass effect. Vascular: No hyperdense vessel or unexpected calcification. Skull: Normal. Negative for fracture or focal lesion. Minimal right frontal scalp hematoma. Sinuses/Orbits: Mild mucoperiosteal thickening throughout the ethmoid air cells and sphenoid sinus. Remaining paranasal sinuses are clear. Other: None. IMPRESSION: 1. Small right frontal scalp hematoma.  No underlying fracture. 2. No acute  intracranial process. 3. Chronic right frontal cortical infarct. Electronically Signed   By: Randa Ngo M.D.   On: 03/05/2022 18:26   DG Elbow Complete Left  Result Date: 03/05/2022 CLINICAL DATA:  Blunt trauma due to a fall. EXAM: LEFT ELBOW - COMPLETE 3+ VIEW COMPARISON:  None Available. FINDINGS: There is no evidence of fracture, dislocation, or joint effusion. There is no evidence of arthropathy or other focal bone abnormality. Soft tissues are unremarkable. IMPRESSION: Negative. Electronically Signed   By: Lucienne Capers M.D.   On: 03/05/2022 17:28   DG Chest Port 1 View  Result Date: 03/05/2022 CLINICAL DATA:  Trauma due to a fall. Positive COVID test this morning. EXAM: PORTABLE CHEST 1 VIEW COMPARISON:  12/02/2020 FINDINGS: Postoperative changes in the mediastinum. Heart size and pulmonary vascularity are normal for technique. Shallow inspiration. Lungs are clear. No pleural effusions. No pneumothorax. Mediastinal contours appear intact. IMPRESSION: No active disease. Electronically Signed   By: Lucienne Capers M.D.   On: 03/05/2022 17:27    Pending Labs Unresulted Labs (From admission, onward)     Start     Ordered   03/12/22 0500  Creatinine, serum  (enoxaparin (LOVENOX)    CrCl >/= 30 ml/min)  Weekly,   R     Comments: while on enoxaparin therapy    03/05/22 1924   03/06/22 0500  Magnesium  Tomorrow morning,   R        03/05/22 1924   03/06/22 0500  CBC with Differential/Platelet  Tomorrow morning,   R        03/05/22 1924   03/06/22 0500  Comprehensive metabolic panel  Tomorrow morning,   R        03/05/22 1925   03/06/22 0500  Lactate dehydrogenase  Tomorrow morning,   R  03/05/22 1928   03/06/22 0500  Fibrinogen  Tomorrow morning,   R        03/05/22 1928   03/05/22 1927  D-dimer, quantitative  Once,   R        03/05/22 1928   03/05/22 1927  Procalcitonin  Once,   R       References:    Procalcitonin Lower Respiratory Tract Infection AND Sepsis Procalcitonin  Algorithm   03/05/22 1928   03/05/22 1923  CBC  (enoxaparin (LOVENOX)    CrCl >/= 30 ml/min)  Once,   R       Comments: Baseline for enoxaparin therapy IF NOT ALREADY DRAWN.  Notify MD if PLT < 100 K.    03/05/22 1924   03/05/22 1923  Creatinine, serum  (enoxaparin (LOVENOX)    CrCl >/= 30 ml/min)  Once,   R       Comments: Baseline for enoxaparin therapy IF NOT ALREADY DRAWN.    03/05/22 1924            Vitals/Pain Today's Vitals   03/05/22 1845 03/05/22 1900 03/05/22 1915 03/05/22 1926  BP: 94/73 (!) 112/56 (!) 110/50   Pulse: 86 80 66   Resp: 17 (!) 24 15   Temp:    98.7 F (37.1 C)  TempSrc:    Oral  SpO2: 98% 98% 97%     Isolation Precautions Airborne and Contact precautions  Medications Medications  clopidogrel (PLAVIX) tablet 75 mg (has no administration in time range)  pantoprazole (PROTONIX) EC tablet 40 mg (has no administration in time range)  rosuvastatin (CRESTOR) tablet 40 mg (has no administration in time range)  enoxaparin (LOVENOX) injection 40 mg (has no administration in time range)  acetaminophen (TYLENOL) tablet 650 mg (has no administration in time range)    Or  acetaminophen (TYLENOL) suppository 650 mg (has no administration in time range)  senna-docusate (Senokot-S) tablet 1 tablet (has no administration in time range)  ondansetron (ZOFRAN) tablet 4 mg (has no administration in time range)    Or  ondansetron (ZOFRAN) injection 4 mg (has no administration in time range)  metoprolol tartrate (LOPRESSOR) injection 5 mg (has no administration in time range)  0.9 %  sodium chloride infusion ( Intravenous New Bag/Given 03/05/22 1958)  dexamethasone (DECADRON) tablet 6 mg (has no administration in time range)  bismuth subsalicylate (PEPTO BISMOL) 262 MG/15ML suspension 30 mL (has no administration in time range)  acetaminophen (TYLENOL) tablet 1,000 mg (1,000 mg Oral Given 03/05/22 1706)  Tdap (BOOSTRIX) injection 0.5 mL (0.5 mLs Intramuscular Given  03/05/22 1828)  sodium chloride 0.9 % bolus 1,000 mL (0 mLs Intravenous Stopped 03/05/22 1926)  dexamethasone (DECADRON) injection 10 mg (10 mg Intravenous Given 03/05/22 1921)    Mobility walks with person assist     Focused Assessments Pulmonary Assessment Handoff:  Lung sounds:   O2 Device: Nasal Cannula O2 Flow Rate (L/min): 2 L/min    R Recommendations: See Admitting Provider Note  Report given to:   Additional Notes:  pT IS A/O x 4 able to ambulate with 2x assistance.

## 2022-03-05 NOTE — ED Provider Notes (Signed)
[facility] Provider Note   CSN: [id number] Arrival date & time: 03/05/22  1622     History  Chief Complaint  Patient presents with   Lytle Michaels    Frederick Kirby is a 87 y.o. male.   Fall     87 year old male presenting to the emergency department after a near syncopal episode and fall earlier today.  The patient states that he had been feeling unwell for the past few days and recently tested positive for COVID-19 at home this morning by home test kit.  He got up from the bed standing to get to the bathroom felt lightheaded and then fell to the ground sustaining an abrasion to his left elbow and right forehead.  He is unsure of his last tetanus status.  He states that he does take Plavix.  He denies any full loss of consciousness.  He denies any chest pain or shortness of breath.  He feels generalized malaise and fatigue.  He arrived to the emergency department GCS 15, ABC intact.  Home Medications Prior to Admission medications   Medication Sig Start Date End Date Taking? Authorizing Provider  amLODipine (NORVASC) 10 MG tablet TAKE 1 TABLET BY MOUTH EVERY DAY 09/12/21   [name], MD  clopidogrel (PLAVIX) 75 MG tablet Take 1 tablet (75 mg total) by mouth daily. 12/19/21   [name], MD  diphenhydramine-acetaminophen (TYLENOL PM) 25-500 MG TABS tablet Take 1 tablet by mouth at bedtime.    [provider]  Ferrous Sulfate (IRON PO) Take 6.5 mg by mouth daily.    [provider]  fluticasone (FLONASE) 50 MCG/ACT nasal spray Place 1 spray into both nostrils daily as needed for allergies.    [provider]  FOLBIC 2.5-25-2 MG TABS tablet TAKE 1 TABLET BY MOUTH EVERY DAY 06/17/21   [name], MD  lisinopril (ZESTRIL) 2.5 MG tablet TAKE 1 TABLET BY MOUTH EVERY DAY 09/12/21   [name], MD  magnesium hydroxide (MILK OF MAGNESIA) 400 MG/5ML suspension Take 15 mLs by mouth 3 (three) times a week.     [provider]  metoprolol succinate (TOPROL-XL) 25 MG 24 hr tablet TAKE 1 TABLET (25 MG TOTAL) BY MOUTH DAILY. 10/28/21   [name], MD  Omega-3 Fatty Acids (FISH OIL) 1200 MG CAPS Take 1 capsule (1,200 mg total) by mouth 2 (two) times daily. Patient taking differently: Take 1 capsule by mouth daily. 03/05/20   [name], MD  oxymetazoline (AFRIN) 0.05 % nasal spray Place 1 spray into both nostrils once a week. Patient not taking: Reported on 06/06/2021    [provider]  pantoprazole (PROTONIX) 40 MG tablet Take 1 tablet (40 mg total) by mouth daily. 03/05/20   [name], MD  rosuvastatin (CRESTOR) 40 MG tablet TAKE 1 TABLET BY MOUTH EVERY DAY 09/12/21   [name], MD  sodium chloride (OCEAN) 0.65 % SOLN nasal spray Place 1 spray into both nostrils daily as needed for congestion.    [provider]      Allergies    Niaspan [niacin er]    Review of Systems   Review of Systems  All other systems reviewed and are negative.   Physical Exam Updated Vital Signs BP 94/73   Pulse 86   Temp (!) 100.7 F (38.2 C) (Oral)   Resp 17   SpO2 98%  Physical Exam Vitals and nursing note reviewed.  Constitutional:  Appearance: He is well-developed.     Comments: GCS 15, ABC intact  HENT:     Head: Normocephalic.     Comments: Abrasion to the vertex of the scalp, hemostatic Eyes:     Conjunctiva/sclera: Conjunctivae normal.  Neck:     Comments: No midline tenderness to palpation of the cervical spine. ROM intact. Cardiovascular:     Rate and Rhythm: Normal rate and regular rhythm.     Heart sounds: No murmur heard. Pulmonary:     Effort: Pulmonary effort is normal. No respiratory distress.     Breath sounds: Normal breath sounds.  Chest:     Comments: Chest wall stable and non-tender to AP and lateral compression. Clavicles stable and non-tender to AP compression Abdominal:     Palpations: Abdomen is soft.     Tenderness: There is  no abdominal tenderness.     Comments: Pelvis stable to lateral compression.  Musculoskeletal:     Cervical back: Neck supple.     Right lower leg: No edema.     Left lower leg: No edema.     Comments: No midline tenderness to palpation of the thoracic or lumbar spine. Extremities atraumatic with intact ROM with the exception of a skin tear to the left elbow, hemostatic and dressing in place  Skin:    General: Skin is warm and dry.  Neurological:     Mental Status: He is alert.     Comments: CN II-XII grossly intact. Moving all four extremities spontaneously and sensation grossly intact.     ED Results / Procedures / Treatments   Labs (all labs ordered are listed, but only abnormal results are displayed) Labs Reviewed  RESP PANEL BY RT-PCR (RSV, FLU A&B, COVID)  RVPGX2 - Abnormal; Notable for the following components:      Result Value   SARS Coronavirus 2 by RT PCR POSITIVE (*)    All other components within normal limits  COMPREHENSIVE METABOLIC PANEL - Abnormal; Notable for the following components:   Sodium 134 (*)    CO2 21 (*)    Glucose, Bld 134 (*)    Creatinine, Ser 1.79 (*)    Calcium 8.8 (*)    AST 52 (*)    ALT 61 (*)    GFR, Estimated 37 (*)    All other components within normal limits  URINALYSIS, ROUTINE W REFLEX MICROSCOPIC - Abnormal; Notable for the following components:   Hgb urine dipstick SMALL (*)    Protein, ur 30 (*)    Bacteria, UA RARE (*)    All other components within normal limits  CBC WITH DIFFERENTIAL/PLATELET - Abnormal; Notable for the following components:   Neutro Abs 7.9 (*)    Lymphs Abs 0.4 (*)    All other components within normal limits  LACTIC ACID, PLASMA  PROTIME-INR    EKG EKG Interpretation  Date/Time:  Wednesday March 05 2022 16:31:56 EST Ventricular Rate:  110 PR Interval:  258 QRS Duration: 87 QT Interval:  344 QTC Calculation: 397 R Axis:   26 Text Interpretation: Sinus or ectopic atrial tachycardia  Ventricular bigeminy Prolonged PR interval Minimal ST depression, anterolateral leads Confirmed by Regan Lemming ([id number]) on 03/05/2022 4:50:31 PM  Radiology CT CERVICAL SPINE WO CONTRAST  Result Date: 03/05/2022 CLINICAL DATA:  [place] out of bed, dizziness, right forehead injury, anticoagulated EXAM: CT CERVICAL SPINE WITHOUT CONTRAST TECHNIQUE: Multidetector CT imaging of the cervical spine was performed without intravenous contrast. Multiplanar CT image reconstructions were also generated. RADIATION  DOSE REDUCTION: This exam was performed according to the departmental dose-optimization program which includes automated exposure control, adjustment of the mA and/or kV according to patient size and/or use of iterative reconstruction technique. COMPARISON:  None Available. FINDINGS: Alignment: Alignment is anatomic. Skull base and vertebrae: No acute fracture. No primary bone lesion or focal pathologic process. Soft tissues and spinal canal: No prevertebral fluid or swelling. No visible canal hematoma. Disc levels: Prominent C5-6 spondylosis, with right greater than left neural foraminal encroachment. Remaining disc spaces are well preserved. Upper chest: Airway is patent. Emphysematous changes at the lung apices. Other: Reconstructed images demonstrate no additional findings. IMPRESSION: 1. No acute cervical spine fracture. 2. Prominent C5-6 spondylosis. Electronically Signed   By: Randa Ngo M.D.   On: 03/05/2022 18:27   CT HEAD WO CONTRAST  Result Date: 03/05/2022 CLINICAL DATA:  [place], dizziness, right forehead injury, anticoagulated EXAM: CT HEAD WITHOUT CONTRAST TECHNIQUE: Contiguous axial images were obtained from the base of the skull through the vertex without intravenous contrast. RADIATION DOSE REDUCTION: This exam was performed according to the departmental dose-optimization program which includes automated exposure control, adjustment of the mA and/or kV according to patient size and/or use of  iterative reconstruction technique. COMPARISON:  06/06/2003 FINDINGS: Brain: Chronic right frontal cortical infarct. No acute infarct or hemorrhage. Lateral ventricles and midline structures are unremarkable. No acute extra-axial fluid collections. No mass effect. Vascular: No hyperdense vessel or unexpected calcification. Skull: Normal. Negative for fracture or focal lesion. Minimal right frontal scalp hematoma. Sinuses/Orbits: Mild mucoperiosteal thickening throughout the ethmoid air cells and sphenoid sinus. Remaining paranasal sinuses are clear. Other: None. IMPRESSION: 1. Small right frontal scalp hematoma.  No underlying fracture. 2. No acute intracranial process. 3. Chronic right frontal cortical infarct. Electronically Signed   By: Randa Ngo M.D.   On: 03/05/2022 18:26   DG Elbow Complete Left  Result Date: 03/05/2022 CLINICAL DATA:  Blunt trauma due to a fall. EXAM: LEFT ELBOW - COMPLETE 3+ VIEW COMPARISON:  None Available. FINDINGS: There is no evidence of fracture, dislocation, or joint effusion. There is no evidence of arthropathy or other focal bone abnormality. Soft tissues are unremarkable. IMPRESSION: Negative. Electronically Signed   By: Lucienne Capers M.D.   On: 03/05/2022 17:28   DG Chest Port 1 View  Result Date: 03/05/2022 CLINICAL DATA:  Trauma due to a fall. Positive COVID test this morning. EXAM: PORTABLE CHEST 1 VIEW COMPARISON:  12/02/2020 FINDINGS: Postoperative changes in the mediastinum. Heart size and pulmonary vascularity are normal for technique. Shallow inspiration. Lungs are clear. No pleural effusions. No pneumothorax. Mediastinal contours appear intact. IMPRESSION: No active disease. Electronically Signed   By: Lucienne Capers M.D.   On: 03/05/2022 17:27    Procedures Procedures    Medications Ordered in ED Medications  dexamethasone (DECADRON) injection 10 mg (has no administration in time range)  acetaminophen (TYLENOL) tablet 1,000 mg (1,000 mg Oral  Given 03/05/22 1706)  Tdap (BOOSTRIX) injection 0.5 mL (0.5 mLs Intramuscular Given 03/05/22 1828)  sodium chloride 0.9 % bolus 1,000 mL (1,000 mLs Intravenous New Bag/Given 03/05/22 1843)    ED Course/ Medical Decision Making/ A&P Clinical Course as of 03/05/22 1906  Wed Mar 05, 2022  1821 SARS Coronavirus 2 by RT PCR(!): POSITIVE [JL]    Clinical Course User Index [JL] Regan Lemming, MD  Medical Decision Making Amount and/or Complexity of Data Reviewed Labs: ordered. Decision-making details documented in ED Course. Radiology: ordered.  Risk OTC drugs. Prescription drug management. Decision regarding hospitalization.    87 year old male presenting to the emergency department after a near syncopal episode and fall earlier today.  The patient states that he had been feeling unwell for the past few days and recently tested positive for COVID-19 at home this morning by home test kit.  He got up from the bed standing to get to the bathroom felt lightheaded and then fell to the ground sustaining an abrasion to his left elbow and right forehead.  He is unsure of his last tetanus status.  He states that he does take Plavix.  He denies any full loss of consciousness.  He denies any chest pain or shortness of breath.  He feels generalized malaise and fatigue.  He arrived to the emergency department GCS 15, ABC intact.  On arrival, the patient was febrile temperature 100.7, heart rate 95, RR 22, saturating between 95% and 91% on room air.  The patient was subsequent placed on 2 L O2 via nasal cannula.  He presents after a near syncopal episode and having sustained head trauma and elbow trauma in the setting of his COVID-19 infection.  He endorses what sounds to be orthostatic hypotension with lightheadedness upon standing with resultant near syncope.  No full loss of consciousness.  The patient denies any other injuries or complaints.  On arrival, the patient was found to  have an abrasion to the vertex of his scalp as well as a skin tear to his left elbow.  Remainder of his exam significant for lungs clear to auscultation bilaterally, no lower extremity edema, abdomen was soft, Nontender, nondistended, no rebound or guarding.  Initial patient EKG revealed sinus tachycardia, ventricular rate 110, ventricular bigeminy was present, nonspecific ST changes, prolonged PR noted.  A chest x-ray and it was performed revealed no active disease.  An x-ray of the left elbow was performed which revealed no traumatic injury.  CT imaging of the head revealed no intracranial abnormality, CT of the cervical spine revealed spondylosis with no evidence of acute fracture or dislocation of the cervical spine.  Patient without lacerations needing repair.  His orthostatic vital signs were grossly positive in the emergency department.  He was administered a single normal saline bolus.  He was found to be febrile administered Tylenol in the setting of his COVID-19 infection.  His COVID-19 PCR testing resulted positive.  Remainder of laboratory evaluation significant for CBC without leukocytosis or anemia, lactic acid normal, CMP with elevated serum creatinine to 1.79 which appears to be at the patient's baseline.  The patient's tetanus was updated in the emergency department.  Given the patient's respiratory failure in the setting of COVID-19, orthostatic hypotension resulting in near syncope, medicine was consulted for admission. Dr. Ernesto Rutherford was consulted and accepted the patient in admission.   Final Clinical Impression(s) / ED Diagnoses Final diagnoses:  COVID-19  Acute respiratory failure with hypoxia ([place])  Near syncope  Orthostatic hypotension    Rx / DC Orders ED Discharge Orders     None         Regan Lemming, MD 03/05/22 1906

## 2022-03-06 DIAGNOSIS — I129 Hypertensive chronic kidney disease with stage 1 through stage 4 chronic kidney disease, or unspecified chronic kidney disease: Secondary | ICD-10-CM | POA: Diagnosis present

## 2022-03-06 DIAGNOSIS — R0902 Hypoxemia: Secondary | ICD-10-CM | POA: Diagnosis present

## 2022-03-06 DIAGNOSIS — Z951 Presence of aortocoronary bypass graft: Secondary | ICD-10-CM | POA: Diagnosis not present

## 2022-03-06 DIAGNOSIS — Z7902 Long term (current) use of antithrombotics/antiplatelets: Secondary | ICD-10-CM | POA: Diagnosis not present

## 2022-03-06 DIAGNOSIS — Y92009 Unspecified place in unspecified non-institutional (private) residence as the place of occurrence of the external cause: Secondary | ICD-10-CM | POA: Diagnosis not present

## 2022-03-06 DIAGNOSIS — S50312A Abrasion of left elbow, initial encounter: Secondary | ICD-10-CM | POA: Diagnosis present

## 2022-03-06 DIAGNOSIS — I951 Orthostatic hypotension: Secondary | ICD-10-CM | POA: Diagnosis present

## 2022-03-06 DIAGNOSIS — J449 Chronic obstructive pulmonary disease, unspecified: Secondary | ICD-10-CM | POA: Diagnosis present

## 2022-03-06 DIAGNOSIS — Z23 Encounter for immunization: Secondary | ICD-10-CM | POA: Diagnosis present

## 2022-03-06 DIAGNOSIS — U071 COVID-19: Secondary | ICD-10-CM | POA: Diagnosis present

## 2022-03-06 DIAGNOSIS — I2510 Atherosclerotic heart disease of native coronary artery without angina pectoris: Secondary | ICD-10-CM | POA: Diagnosis present

## 2022-03-06 DIAGNOSIS — S0003XA Contusion of scalp, initial encounter: Secondary | ICD-10-CM | POA: Diagnosis present

## 2022-03-06 DIAGNOSIS — I252 Old myocardial infarction: Secondary | ICD-10-CM | POA: Diagnosis not present

## 2022-03-06 DIAGNOSIS — Z87891 Personal history of nicotine dependence: Secondary | ICD-10-CM | POA: Diagnosis not present

## 2022-03-06 DIAGNOSIS — Z79899 Other long term (current) drug therapy: Secondary | ICD-10-CM | POA: Diagnosis not present

## 2022-03-06 DIAGNOSIS — Z8673 Personal history of transient ischemic attack (TIA), and cerebral infarction without residual deficits: Secondary | ICD-10-CM | POA: Diagnosis not present

## 2022-03-06 DIAGNOSIS — N184 Chronic kidney disease, stage 4 (severe): Secondary | ICD-10-CM | POA: Diagnosis present

## 2022-03-06 DIAGNOSIS — M47812 Spondylosis without myelopathy or radiculopathy, cervical region: Secondary | ICD-10-CM | POA: Diagnosis present

## 2022-03-06 DIAGNOSIS — E785 Hyperlipidemia, unspecified: Secondary | ICD-10-CM | POA: Diagnosis present

## 2022-03-06 DIAGNOSIS — S0081XA Abrasion of other part of head, initial encounter: Secondary | ICD-10-CM | POA: Diagnosis present

## 2022-03-06 DIAGNOSIS — W01190A Fall on same level from slipping, tripping and stumbling with subsequent striking against furniture, initial encounter: Secondary | ICD-10-CM | POA: Diagnosis present

## 2022-03-06 MED ORDER — METOPROLOL SUCCINATE ER 25 MG PO TB24
25.0000 mg | ORAL_TABLET | Freq: Every day | ORAL | Status: DC
  Administered 2022-03-06 – 2022-03-07 (×2): 25 mg via ORAL
  Filled 2022-03-06 (×2): qty 1

## 2022-03-06 MED ORDER — SODIUM CHLORIDE 0.9 % IV SOLN: INTRAVENOUS | Status: DC

## 2022-03-06 MED ORDER — NIRMATRELVIR/RITONAVIR (PAXLOVID) TABLET (RENAL DOSING)
2.0000 | ORAL_TABLET | Freq: Two times a day (BID) | ORAL | Status: DC
  Administered 2022-03-07: 2 via ORAL
  Filled 2022-03-06 (×2): qty 20

## 2022-03-06 NOTE — Progress Notes (Signed)
PROGRESS NOTE  Frederick Kirby  DOB: 20-Aug-1935  PCP: Camillia Herter, NP [id number]  DOA: 03/05/2022  LOS: 0 days  Hospital Day: 2  Brief narrative: Frederick Kirby is a 87 y.o. male with PMH significant for HTN, HLD, CAD/CABG on Plavix, COPD and a remote history of stroke. 2/21, patient was brought to the ED from home after a fall. The previous day, patient started feeling sick with URI symptoms, sore throat, cough and weakness.  Symptoms lasted overnight.  Next morning, he tested positive for COVID with the kit at home.  He tried over-the-counter supportive medicines but continued to feel weak and dizzy.  At one instance, while trying to stand up, he got very dizzy, fell against a sofa and hit his head on the right side.  Family helped him get up and brought to the ED by EMS.  In the ED, patient was very weak with positive orthostatic vital signs.  Fever 100.7, O2 sat in low 90s and was placed on 2 L oxygen by nasal cannula. He tested positive for COVID.   Labs showed sodium 134, BUN/creatinine 15/1.79, AST/ALT mildly elevated, lactic acid level normal, WBC count normal.  CT scan of head showed small right frontal scalp hematoma, no acute intracranial process, chronic right frontal cortical infarct. CT cervical spine did not show any acute fracture or dislocation.  It showed prominent C5-6 spondylosis. Chest x-ray unremarkable for pneumonia   Subjective: Patient was seen and examined this morning.  Pleasant elderly Caucasian male.  Propped up in bed.  Not in distress.  Bandage around the right frontal scalp hematoma. Not on supplemental oxygen.  Feels better.  Family not at bedside. Chart reviewed No recurrence of fever overnight, heart rate in 70s, blood pressure in low 100s, on 2 L oxygen nasal cannula this morning. No labs this morning.  Assessment and plan: COVID infection Acute respiratory failure with hypoxia  COPD Presented with URI, lethargy, hypoxia and COVID test  positive at home chest x-ray without pneumonia patient has been started on remdesivir as well as Decadron.  Will plan to switch to Paxlovid this morning. Patient feels better this morning.  Breathing on room air. Continue supportive measures with antitussives, incentive spirometry, early mobilization, bronchodilators WBC and inflammatory markers trend as below. Recent Labs  Lab 03/05/22 1640 03/05/22 1654 03/05/22 1658 03/05/22 1954  SARSCOV2NAA  --  POSITIVE*  --   --   WBC 9.4  --   --  7.3  LATICACIDVEN  --   --  1.5  --   PROCALCITON  --   --   --  0.15  DDIMER  --   --   --  4.86*  ALT 61*  --   --   --    Elevated D-dimer Significantly elevated D-dimer, probably due to COVID infection. No recent history of travel.  Not hypoxic at this time.  Orthostatic hypotension History of hypertension Symptomatic orthostatic hypotension and near syncope due to COVID PTA on metoprolol 25 mg daily, Norvasc 10 mg daily, lisinopril 2.5 g daily I would resume metoprolol this morning and keep others on hold. Continue to monitor blood pressure  Falls Due to orthostatic hypotension Had abrasion in the right forehead.  No intracranial process and CT scan PT eval ordered.  CAD/CABG Remote history of stroke No active chest pain.   CT head showed chronic right frontal cortical infarct.   PTA on Plavix 75 mg daily, Crestor 40 mg daily Continue the same. CKD  4 Creatinine at baseline.  Continue to monitor Recent Labs    06/12/21 1029 03/05/22 1640 03/05/22 1954  BUN 20 15  --   CREATININE 1.78* 1.79* 1.72*   Transaminitis Likely due to COVID itself.  Continue to monitor. Recent Labs  Lab 03/05/22 1640 03/05/22 1954  AST 52*  --   ALT 61*  --   ALKPHOS 74  --   BILITOT 0.9  --   PROT 6.7  --   ALBUMIN 3.7  --   INR 1.1  --   PLT 162 150   Chronic iron deficiency Continue PPI and iron supplement Recent Labs    06/12/21 1029 03/05/22 1640 03/05/22 1954  HGB  --  13.9  12.4*  MCV  --  95.0 96.4  VITAMINB12 791  --   --   FOLATE >20.0  --   --   FERRITIN 73  --   --   TIBC 375  --   --   IRON 155  --   --      Mobility: Encourage ambulation.  PT eval ordered.  Please check orthostatic vital sign as well as oxygen requirement at the time.  Goals of care   Code Status: Full Code     DVT prophylaxis:  enoxaparin (LOVENOX) injection 40 mg Start: 03/05/22 1930   Antimicrobials: Paxlovid Fluid: Continue NS at 100 mill per hour Consultants: None Family Communication: None at bedside  Status is: Observation Level of care: Telemetry Medical   Dispo: Patient is from: Home              Anticipated d/c is to: Hopefully home tomorrow Continue in-hospital care because: Pending PT eval, orthostatic blood pressure change   Scheduled Meds:  clopidogrel  75 mg Oral Daily   dexamethasone  6 mg Oral Q24H   enoxaparin (LOVENOX) injection  40 mg Subcutaneous Q24H   pantoprazole  40 mg Oral Daily   rosuvastatin  40 mg Oral Daily    PRN meds: acetaminophen **OR** acetaminophen, albuterol, bismuth subsalicylate, guaiFENesin-dextromethorphan, metoprolol tartrate, ondansetron **OR** ondansetron (ZOFRAN) IV, senna-docusate   Infusions:   remdesivir 100 mg in sodium chloride 0.9 % 100 mL IVPB      Diet:  Diet Order             Diet Heart Room service appropriate? No; Fluid consistency: Thin  Diet effective now                   Antimicrobials: Anti-infectives (From admission, onward)    Start     Dose/Rate Route Frequency Ordered Stop   03/06/22 1000  remdesivir 100 mg in sodium chloride 0.9 % 100 mL IVPB       See Hyperspace for full Linked Orders Report.   100 mg 200 mL/hr over 30 Minutes Intravenous Daily 03/05/22 2037 03/08/22 0959   03/05/22 2200  nirmatrelvir/ritonavir (renal dosing) (PAXLOVID) 2 tablet  Status:  Discontinued        2 tablet Oral 2 times daily 03/05/22 1928 03/05/22 2015   03/05/22 2200  remdesivir 200 mg in sodium  chloride 0.9% 250 mL IVPB       See Hyperspace for full Linked Orders Report.   200 mg 580 mL/hr over 30 Minutes Intravenous Once 03/05/22 2037 03/06/22 0041       Skin assessment:       Nutritional status:  There is no height or weight on file to calculate BMI.  Objective: Vitals:   03/06/22 0551 03/06/22 0600  BP:    Pulse: (!) 102 98  Resp:  14  Temp: 97.9 F (36.6 C)   SpO2:      Intake/Output Summary (Last 24 hours) at 03/06/2022 0849 Last data filed at 03/06/2022 0846 Gross per 24 hour  Intake 1495.71 ml  Output 1425 ml  Net 70.71 ml   There were no vitals filed for this visit. Weight change:  There is no height or weight on file to calculate BMI.   Physical Exam: General exam: Pleasant, elderly Caucasian male. Skin: No rashes, lesions or ulcers. HEENT: normocephalic. Right frontal hematoma has bandage in place. Lungs: Clear to auscultate bilaterally. CVS: Regular rate and rhythm, no murmur GI/Abd soft, nontender, nondistended, bowel sound present CNS: Alert, awake, oriented x 3 Psychiatry: Mood appropriate Extremities: No pedal edema, no calf tenderness  Data Review: I have personally reviewed the laboratory data and studies available.  F/u labs ordered Unresulted Labs (From admission, onward)     Start     Ordered   03/12/22 0500  Creatinine, serum  (enoxaparin (LOVENOX)    CrCl >/= 30 ml/min)  Weekly,   R     Comments: while on enoxaparin therapy    03/05/22 1924   03/06/22 0500  Magnesium  Tomorrow morning,   R        03/05/22 1924   03/06/22 0500  CBC with Differential/Platelet  Tomorrow morning,   R        03/05/22 1924   03/06/22 0500  Comprehensive metabolic panel  Tomorrow morning,   R        03/05/22 1925   03/06/22 0500  Lactate dehydrogenase  Tomorrow morning,   R        03/05/22 1928   03/06/22 0500  Fibrinogen  Tomorrow morning,   R        03/05/22 1928            Total time spent in review of labs and imaging,  patient evaluation, formulation of plan, documentation and communication with family: 8 minutes  Signed, Terrilee Croak, MD [facility] 03/06/2022

## 2022-03-06 NOTE — Progress Notes (Signed)
Patient arrived on unit around 21:45. Vital signs are stable. Patient is drowsy and disoriented to time and situation.

## 2022-03-06 NOTE — Plan of Care (Signed)
  Problem: Activity: Goal: Risk for activity intolerance will decrease Outcome: Progressing   Problem: Nutrition: Goal: Adequate nutrition will be maintained Outcome: Progressing   Problem: Pain Managment: Goal: General experience of comfort will improve Outcome: Progressing   Problem: Skin Integrity: Goal: Risk for impaired skin integrity will decrease Outcome: Progressing   

## 2022-03-07 DIAGNOSIS — U071 COVID-19: Secondary | ICD-10-CM | POA: Diagnosis not present

## 2022-03-07 LAB — COMPREHENSIVE METABOLIC PANEL
ALT: 55 U/L — ABNORMAL HIGH (ref 0–44)
AST: 36 U/L (ref 15–41)
Albumin: 3.1 g/dL — ABNORMAL LOW (ref 3.5–5.0)
Alkaline Phosphatase: 56 U/L (ref 38–126)
Anion gap: 8 (ref 5–15)
BUN: 19 mg/dL (ref 8–23)
CO2: 22 mmol/L (ref 22–32)
Calcium: 8.4 mg/dL — ABNORMAL LOW (ref 8.9–10.3)
Chloride: 108 mmol/L (ref 98–111)
Creatinine, Ser: 1.27 mg/dL — ABNORMAL HIGH (ref 0.61–1.24)
GFR, Estimated: 56 mL/min — ABNORMAL LOW (ref >60)
Glucose, Bld: 135 mg/dL — ABNORMAL HIGH (ref 70–99)
Potassium: 4.2 mmol/L (ref 3.5–5.1)
Sodium: 138 mmol/L (ref 135–145)
Total Bilirubin: 0.6 mg/dL (ref 0.3–1.2)
Total Protein: 6.3 g/dL — ABNORMAL LOW (ref 6.5–8.1)

## 2022-03-07 LAB — CBC WITH DIFFERENTIAL/PLATELET
Abs Immature Granulocytes: 0.05 10*3/uL (ref 0.00–0.07)
Basophils Absolute: 0 10*3/uL (ref 0.0–0.1)
Basophils Relative: 0 %
Eosinophils Absolute: 0 10*3/uL (ref 0.0–0.5)
Eosinophils Relative: 0 %
HCT: 40.4 % (ref 39.0–52.0)
Hemoglobin: 13.6 g/dL (ref 13.0–17.0)
Immature Granulocytes: 1 %
Lymphocytes Relative: 6 %
Lymphs Abs: 0.6 10*3/uL — ABNORMAL LOW (ref 0.7–4.0)
MCH: 31.6 pg (ref 26.0–34.0)
MCHC: 33.7 g/dL (ref 30.0–36.0)
MCV: 94 fL (ref 80.0–100.0)
Monocytes Absolute: 0.4 10*3/uL (ref 0.1–1.0)
Monocytes Relative: 4 %
Neutro Abs: 8.9 10*3/uL — ABNORMAL HIGH (ref 1.7–7.7)
Neutrophils Relative %: 89 %
Platelets: 167 10*3/uL (ref 150–400)
RBC: 4.3 MIL/uL (ref 4.22–5.81)
RDW: 12.6 % (ref 11.5–15.5)
WBC: 9.9 10*3/uL (ref 4.0–10.5)
nRBC: 0 % (ref 0.0–0.2)

## 2022-03-07 LAB — LACTATE DEHYDROGENASE: LDH: 152 U/L (ref 98–192)

## 2022-03-07 LAB — MAGNESIUM: Magnesium: 1.9 mg/dL (ref 1.7–2.4)

## 2022-03-07 MED ORDER — DEXAMETHASONE 6 MG PO TABS: 6.0000 mg | ORAL_TABLET | ORAL | 0 refills | Status: AC

## 2022-03-07 MED ORDER — NIRMATRELVIR/RITONAVIR (PAXLOVID) TABLET (RENAL DOSING): 2.0000 | ORAL_TABLET | Freq: Two times a day (BID) | ORAL | 0 refills | Status: AC

## 2022-03-07 MED ORDER — GUAIFENESIN-DM 100-10 MG/5ML PO SYRP: 5.0000 mL | ORAL_SOLUTION | ORAL | 0 refills | Status: DC | PRN

## 2022-03-07 NOTE — Discharge Summary (Incomplete)
Discharge teaching provided to patient and patient's wife. IV access removed. Patient transported to main lobby for transportation home.

## 2022-03-07 NOTE — Discharge Summary (Signed)
Physician Discharge Summary  KASHEEM MAZZARELLA [id number] DOB: [birth year] DOA: 03/05/2022  PCP: Camillia Herter, NP  Admit date: 03/05/2022 Discharge date: 03/07/2022  Admitted From: Home Discharge disposition: Home  Recommendations at discharge:  Complete the course of Paxlovid at home  Brief narrative: Frederick Kirby is a 87 y.o. male with PMH significant for HTN, HLD, CAD/CABG on Plavix, COPD and a remote history of stroke. 2/21, patient was brought to the ED from home after a fall. The previous day, patient started feeling sick with URI symptoms, sore throat, cough and weakness.  Symptoms lasted overnight.  Next morning, he tested positive for COVID with the kit at home.  He tried over-the-counter supportive medicines but continued to feel weak and dizzy.  At one instance, while trying to stand up, he got very dizzy, fell against a sofa and hit his head on the right side.  Family helped him get up and brought to the ED by EMS.  In the ED, patient was very weak with positive orthostatic vital signs.  Fever 100.7, O2 sat in low 90s and was placed on 2 L oxygen by nasal cannula. He tested positive for COVID.   Labs showed sodium 134, BUN/creatinine 15/1.79, AST/ALT mildly elevated, lactic acid level normal, WBC count normal.  CT scan of head showed small right frontal scalp hematoma, no acute intracranial process, chronic right frontal cortical infarct. CT cervical spine did not show any acute fracture or dislocation.  It showed prominent C5-6 spondylosis. Chest x-ray unremarkable for pneumonia   Subjective: Patient was seen and examined this morning.   Pleasant elderly Caucasian male.  Propped up in bed.  Not in distress.  Bandage around the right frontal scalp hematoma. Not on supplemental oxygen.  Feels better.  Family not at bedside. Got adequate IV hydration.  No drop in orthostatic blood pressure this morning.  Assessment and plan: COVID infection Acute respiratory failure  with hypoxia  COPD Presented with URI, lethargy, hypoxia and COVID test positive at home chest x-ray without pneumonia.  Patient was started on remdesivir as well as Decadron.  2/22, I switched him to Paxlovid. Patient feels better.  Breathing on room air. Not complaining of any cough or shortness of breath or wheezing. WBC and inflammatory markers trend as below. Recent Labs  Lab 03/05/22 1640 03/05/22 1654 03/05/22 1658 03/05/22 1954 03/07/22 0421  SARSCOV2NAA  --  POSITIVE*  --   --   --   WBC 9.4  --   --  7.3 9.9  LATICACIDVEN  --   --  1.5  --   --   PROCALCITON  --   --   --  0.15  --   DDIMER  --   --   --  4.86*  --   LDH  --   --   --   --  152  ALT 61*  --   --   --  55*   Elevated D-dimer Significantly elevated D-dimer, probably due to COVID infection. No recent history of travel.  Not hypoxic at this time.  Orthostatic hypotension History of hypertension Symptomatic orthostatic hypotension and near syncope due to COVID PTA on metoprolol 25 mg daily, Norvasc 10 mg daily, lisinopril 2.5 g daily Currently is continued on metoprolol.  On hold on Norvasc and lisinopril. Blood pressure improved with hydration.  Okay to resume meds at home.  Falls Due to orthostatic hypotension Had abrasion in the right forehead.  No intracranial process and CT  scan PT eval obtained.  No follow-up needs.  CAD/CABG Remote history of stroke No active chest pain.   CT head showed chronic right frontal cortical infarct.   PTA on Plavix 75 mg daily, Crestor 40 mg daily Continue the same.  CKD 4 Creatinine at baseline.  Continue to monitor Recent Labs    06/12/21 1029 03/05/22 1640 03/05/22 1954 03/07/22 0421  BUN 20 15  --  19  CREATININE 1.78* 1.79* 1.72* 1.27*   Transaminitis Likely due to COVID itself.  Continue to monitor. Recent Labs  Lab 03/05/22 1640 03/05/22 1954 03/07/22 0421  AST 52*  --  36  ALT 61*  --  55*  ALKPHOS 74  --  56  BILITOT 0.9  --  0.6  PROT  6.7  --  6.3*  ALBUMIN 3.7  --  3.1*  INR 1.1  --   --   PLT 162 150 167   Chronic iron deficiency Continue PPI and iron supplement Recent Labs    06/12/21 1029 03/05/22 1640 03/05/22 1640 03/05/22 1954 03/07/22 0421  HGB  --  13.9  --  12.4* 13.6  MCV  --  95.0   < > 96.4 94.0  VITAMINB12 791  --   --   --   --   FOLATE >20.0  --   --   --   --   FERRITIN 73  --   --   --   --   TIBC 375  --   --   --   --   IRON 155  --   --   --   --    < > = values in this interval not displayed.   Wounds:  - Wound / Incision (Open or Dehisced) 03/05/22 Skin tear Face Right;Upper (Active)  Date First Assessed/Time First Assessed: 03/05/22 2200   Wound Type: Skin tear  Location: Face  Location Orientation: Right;Upper  Present on Admission: Yes    Assessments 03/05/2022  9:54 PM 03/07/2022  8:00 AM  Dressing Type None Gauze (Comment);Compression wrap  Dressing Changed -- New  Dressing Status -- Clean, Dry, Intact  Dressing Change Frequency -- Daily  Site / Wound Assessment -- Dressing in place / Unable to assess     No associated orders.     Wound / Incision (Open or Dehisced) (Active)  No Date First Assessed or Time First Assessed found.      Assessments 03/05/2022  9:54 PM  Drainage Amount Scant     No associated orders.     Wound / Incision (Open or Dehisced) 03/05/22 Skin tear Elbow Left;Posterior (Active)  Date First Assessed/Time First Assessed: 03/05/22 2200   Wound Type: Skin tear  Location: Elbow  Location Orientation: Left;Posterior  Present on Admission: Yes    Assessments 03/05/2022  9:54 PM 03/07/2022  8:00 AM  Dressing Type Gauze (Comment) Foam - Lift dressing to assess site every shift  Dressing Changed New New  Dressing Status Clean Clean, Dry, Intact  Dressing Change Frequency PRN Daily  Site / Wound Assessment Bleeding Dressing in place / Unable to assess  Drainage Amount Scant --  Treatment Cleansed --     No associated orders.    Discharge Exam:    Vitals:   03/06/22 1512 03/06/22 1918 03/07/22 0455 03/07/22 0755  BP:  (!) 102/46 (!) 153/73 138/67  Pulse:  79 81 75  Resp:  17 18 18  $ Temp:  98.3 F (36.8 C) 98.3 F (36.8 C)  97.6 F (36.4 C)  TempSrc:    Oral  SpO2:  96% 98% 98%  Weight: 80.2 kg     Height: 5' 9"$  (1.753 m)       Body mass index is 26.11 kg/m.   General exam: Pleasant, elderly Caucasian male. Skin: No rashes, lesions or ulcers. HEENT: normocephalic. Right frontal hematoma has bandage in place. Lungs: Clear to auscultation bilaterally. CVS: Regular rate and rhythm, no murmur GI/Abd soft, nontender, nondistended, bowel sound present CNS: Alert, awake, oriented x 3 Psychiatry: Mood appropriate Extremities: No pedal edema, no calf tenderness  Follow ups:    Follow-up Information     Camillia Herter, NP Follow up.   Specialty: Nurse Practitioner Contact information: [address] [place] [address] [phone]                 Discharge Instructions:   Discharge Instructions     Call MD for:  difficulty breathing, headache or visual disturbances   Complete by: As directed    Call MD for:  extreme fatigue   Complete by: As directed    Call MD for:  hives   Complete by: As directed    Call MD for:  persistant dizziness or light-headedness   Complete by: As directed    Call MD for:  persistant nausea and vomiting   Complete by: As directed    Call MD for:  severe uncontrolled pain   Complete by: As directed    Call MD for:  temperature >100.4   Complete by: As directed    Diet - low sodium heart healthy   Complete by: As directed    Discharge instructions   Complete by: As directed    Recommendations at discharge:   Complete the course of Paxlovid at home  General discharge instructions: Follow with Primary MD Camillia Herter, NP in 7 days  Please request your PCP  to go over your hospital tests, procedures, radiology results at the follow up. Please get your  medicines reviewed and adjusted.  Your PCP may decide to repeat certain labs or tests as needed. Do not drive, operate heavy machinery, perform activities at heights, swimming or participation in water activities or provide baby sitting services if your were admitted for syncope or siezures until you have seen by Primary MD or a Neurologist and advised to do so again. [place] Controlled Substance Reporting System database was reviewed. Do not drive, operate heavy machinery, perform activities at heights, swim, participate in water activities or provide baby-sitting services while on medications for pain, sleep and mood until your outpatient physician has reevaluated you and advised to do so again.  You are strongly recommended to comply with the dose, frequency and duration of prescribed medications. Activity: As tolerated with Full fall precautions use walker/cane & assistance as needed Avoid using any recreational substances like cigarette, tobacco, alcohol, or non-prescribed drug. If you experience worsening of your admission symptoms, develop shortness of breath, life threatening emergency, suicidal or homicidal thoughts you must seek medical attention immediately by calling 911 or calling your MD immediately  if symptoms less severe. You must read complete instructions/literature along with all the possible adverse reactions/side effects for all the medicines you take and that have been prescribed to you. Take any new medicine only after you have completely understood and accepted all the possible adverse reactions/side effects.  Wear Seat belts while driving. You were cared for by a hospitalist during your hospital stay. If  you have any questions about your discharge medications or the care you received while you were in the hospital after you are discharged, you can call the unit and ask to speak with the hospitalist or the covering physician. Once you are discharged, your primary care  physician will handle any further medical issues. Please note that NO REFILLS for any discharge medications will be authorized once you are discharged, as it is imperative that you return to your primary care physician (or establish a relationship with a primary care physician if you do not have one).   Discharge wound care:   Complete by: As directed    Increase activity slowly   Complete by: As directed        Discharge Medications:   Allergies as of 03/07/2022       Reactions   Niaspan [niacin Er]    Turned red & his skin burned        Medication List     TAKE these medications    amLODipine 10 MG tablet Commonly known as: NORVASC TAKE 1 TABLET BY MOUTH EVERY DAY   clopidogrel 75 MG tablet Commonly known as: PLAVIX Take 1 tablet (75 mg total) by mouth daily.   dexamethasone 6 MG tablet Commonly known as: DECADRON Take 1 tablet (6 mg total) by mouth daily for 7 days.   diphenhydramine-acetaminophen 25-500 MG Tabs tablet Commonly known as: TYLENOL PM Take 1 tablet by mouth at bedtime.   Fish Oil 1200 MG Caps Take 1 capsule (1,200 mg total) by mouth 2 (two) times daily. What changed: when to take this   fluticasone 50 MCG/ACT nasal spray Commonly known as: FLONASE Place 1 spray into both nostrils daily as needed for allergies.   Folbic 2.5-25-2 MG Tabs tablet Generic drug: folic acid-pyridoxine-cyancobalamin TAKE 1 TABLET BY MOUTH EVERY DAY   guaiFENesin-dextromethorphan 100-10 MG/5ML syrup Commonly known as: ROBITUSSIN DM Take 5 mLs by mouth every 4 (four) hours as needed for cough.   IRON PO Take 6.5 mg by mouth daily.   lisinopril 2.5 MG tablet Commonly known as: ZESTRIL TAKE 1 TABLET BY MOUTH EVERY DAY   magnesium hydroxide 400 MG/5ML suspension Commonly known as: MILK OF MAGNESIA Take 15 mLs by mouth 3 (three) times a week.   metoprolol succinate 25 MG 24 hr tablet Commonly known as: TOPROL-XL TAKE 1 TABLET (25 MG TOTAL) BY MOUTH DAILY.    nirmatrelvir/ritonavir (renal dosing) 10 x 150 MG & 10 x 100MG Tabs Commonly known as: PAXLOVID Take 2 tablets by mouth 2 (two) times daily for 5 days. Patient GFR is 56. Take nirmatrelvir (150 mg) one tablet twice daily for 5 days and ritonavir (100 mg) one tablet twice daily for 5 days.   pantoprazole 40 MG tablet Commonly known as: PROTONIX Take 1 tablet (40 mg total) by mouth daily.   rosuvastatin 40 MG tablet Commonly known as: CRESTOR TAKE 1 TABLET BY MOUTH EVERY DAY   sodium chloride 0.65 % Soln nasal spray Commonly known as: OCEAN Place 1 spray into both nostrils daily as needed for congestion.               Discharge Care Instructions  (From admission, onward)           Start     Ordered   03/07/22 0000  Discharge wound care:        03/07/22 1113             The results of significant diagnostics from  this hospitalization (including imaging, microbiology, ancillary and laboratory) are listed below for reference.    Procedures and Diagnostic Studies:   CT CERVICAL SPINE WO CONTRAST  Result Date: 03/05/2022 CLINICAL DATA:  [place] out of bed, dizziness, right forehead injury, anticoagulated EXAM: CT CERVICAL SPINE WITHOUT CONTRAST TECHNIQUE: Multidetector CT imaging of the cervical spine was performed without intravenous contrast. Multiplanar CT image reconstructions were also generated. RADIATION DOSE REDUCTION: This exam was performed according to the departmental dose-optimization program which includes automated exposure control, adjustment of the mA and/or kV according to patient size and/or use of iterative reconstruction technique. COMPARISON:  None Available. FINDINGS: Alignment: Alignment is anatomic. Skull base and vertebrae: No acute fracture. No primary bone lesion or focal pathologic process. Soft tissues and spinal canal: No prevertebral fluid or swelling. No visible canal hematoma. Disc levels: Prominent C5-6 spondylosis, with right greater than  left neural foraminal encroachment. Remaining disc spaces are well preserved. Upper chest: Airway is patent. Emphysematous changes at the lung apices. Other: Reconstructed images demonstrate no additional findings. IMPRESSION: 1. No acute cervical spine fracture. 2. Prominent C5-6 spondylosis. Electronically Signed   By: Randa Ngo M.D.   On: 03/05/2022 18:27   CT HEAD WO CONTRAST  Result Date: 03/05/2022 CLINICAL DATA:  [place], dizziness, right forehead injury, anticoagulated EXAM: CT HEAD WITHOUT CONTRAST TECHNIQUE: Contiguous axial images were obtained from the base of the skull through the vertex without intravenous contrast. RADIATION DOSE REDUCTION: This exam was performed according to the departmental dose-optimization program which includes automated exposure control, adjustment of the mA and/or kV according to patient size and/or use of iterative reconstruction technique. COMPARISON:  06/06/2003 FINDINGS: Brain: Chronic right frontal cortical infarct. No acute infarct or hemorrhage. Lateral ventricles and midline structures are unremarkable. No acute extra-axial fluid collections. No mass effect. Vascular: No hyperdense vessel or unexpected calcification. Skull: Normal. Negative for fracture or focal lesion. Minimal right frontal scalp hematoma. Sinuses/Orbits: Mild mucoperiosteal thickening throughout the ethmoid air cells and sphenoid sinus. Remaining paranasal sinuses are clear. Other: None. IMPRESSION: 1. Small right frontal scalp hematoma.  No underlying fracture. 2. No acute intracranial process. 3. Chronic right frontal cortical infarct. Electronically Signed   By: Randa Ngo M.D.   On: 03/05/2022 18:26   DG Elbow Complete Left  Result Date: 03/05/2022 CLINICAL DATA:  Blunt trauma due to a fall. EXAM: LEFT ELBOW - COMPLETE 3+ VIEW COMPARISON:  None Available. FINDINGS: There is no evidence of fracture, dislocation, or joint effusion. There is no evidence of arthropathy or other focal  bone abnormality. Soft tissues are unremarkable. IMPRESSION: Negative. Electronically Signed   By: Lucienne Capers M.D.   On: 03/05/2022 17:28   DG Chest Port 1 View  Result Date: 03/05/2022 CLINICAL DATA:  Trauma due to a fall. Positive COVID test this morning. EXAM: PORTABLE CHEST 1 VIEW COMPARISON:  12/02/2020 FINDINGS: Postoperative changes in the mediastinum. Heart size and pulmonary vascularity are normal for technique. Shallow inspiration. Lungs are clear. No pleural effusions. No pneumothorax. Mediastinal contours appear intact. IMPRESSION: No active disease. Electronically Signed   By: Lucienne Capers M.D.   On: 03/05/2022 17:27     Labs:   Basic Metabolic Panel: Recent Labs  Lab 03/05/22 1640 03/05/22 1954 03/07/22 0421  NA 134*  --  138  K 4.1  --  4.2  CL 101  --  108  CO2 21*  --  22  GLUCOSE 134*  --  135*  BUN 15  --  19  CREATININE 1.79* 1.72* 1.27*  CALCIUM 8.8*  --  8.4*  MG  --   --  1.9   GFR Estimated Creatinine Clearance: 43.3 mL/min (A) (by C-G formula based on SCr of 1.27 mg/dL (H)). Liver Function Tests: Recent Labs  Lab 03/05/22 1640 03/07/22 0421  AST 52* 36  ALT 61* 55*  ALKPHOS 74 56  BILITOT 0.9 0.6  PROT 6.7 6.3*  ALBUMIN 3.7 3.1*   No results for input(s): "LIPASE", "AMYLASE" in the last 168 hours. No results for input(s): "AMMONIA" in the last 168 hours. Coagulation profile Recent Labs  Lab 03/05/22 1640  INR 1.1    CBC: Recent Labs  Lab 03/05/22 1640 03/05/22 1954 03/07/22 0421  WBC 9.4 7.3 9.9  NEUTROABS 7.9*  --  8.9*  HGB 13.9 12.4* 13.6  HCT 42.1 37.2* 40.4  MCV 95.0 96.4 94.0  PLT 162 150 167   Cardiac Enzymes: No results for input(s): "CKTOTAL", "CKMB", "CKMBINDEX", "TROPONINI" in the last 168 hours. BNP: Invalid input(s): "POCBNP" CBG: No results for input(s): "GLUCAP" in the last 168 hours. D-Dimer Recent Labs    03/05/22 1954  DDIMER 4.86*   Hgb A1c No results for input(s): "HGBA1C" in the last 72  hours. Lipid Profile No results for input(s): "CHOL", "HDL", "LDLCALC", "TRIG", "CHOLHDL", "LDLDIRECT" in the last 72 hours. Thyroid function studies No results for input(s): "TSH", "T4TOTAL", "T3FREE", "THYROIDAB" in the last 72 hours.  Invalid input(s): "FREET3" Anemia work up No results for input(s): "VITAMINB12", "FOLATE", "FERRITIN", "TIBC", "IRON", "RETICCTPCT" in the last 72 hours. Microbiology Recent Results (from the past 240 hour(s))  Resp panel by RT-PCR (RSV, Flu A&B, Covid) Anterior Nasal Swab     Status: Abnormal   Collection Time: 03/05/22  4:54 PM   Specimen: Anterior Nasal Swab  Result Value Ref Range Status   SARS Coronavirus 2 by RT PCR POSITIVE (A) NEGATIVE Final   Influenza A by PCR NEGATIVE NEGATIVE Final   Influenza B by PCR NEGATIVE NEGATIVE Final    Comment: (NOTE) The Xpert Xpress SARS-CoV-2/FLU/RSV plus assay is intended as an aid in the diagnosis of influenza from Nasopharyngeal swab specimens and should not be used as a sole basis for treatment. Nasal washings and aspirates are unacceptable for Xpert Xpress SARS-CoV-2/FLU/RSV testing.  Fact Sheet for Patients: EntrepreneurPulse.com.au  Fact Sheet for Healthcare Providers: IncredibleEmployment.be  This test is not yet approved or cleared by the [place] FDA and has been authorized for detection and/or diagnosis of SARS-CoV-2 by FDA under an Emergency Use Authorization (EUA). This EUA will remain in effect (meaning this test can be used) for the duration of the COVID-19 declaration under Section 564(b)(1) of the Act, 21 U.S.C. section 360bbb-3(b)(1), unless the authorization is terminated or revoked.     Resp Syncytial Virus by PCR NEGATIVE NEGATIVE Final    Comment: (NOTE) Fact Sheet for Patients: EntrepreneurPulse.com.au  Fact Sheet for Healthcare Providers: IncredibleEmployment.be  This test is not yet approved or  cleared by the [place] FDA and has been authorized for detection and/or diagnosis of SARS-CoV-2 by FDA under an Emergency Use Authorization (EUA). This EUA will remain in effect (meaning this test can be used) for the duration of the COVID-19 declaration under Section 564(b)(1) of the Act, 21 U.S.C. section 360bbb-3(b)(1), unless the authorization is terminated or revoked.  Performed at [facility], [place] [address] [place], [place] [address]     Time coordinating discharge: 35 minutes  Signed: Marlowe Aschoff Aashi Derrington  [facility] 03/07/2022,  11:13 AM

## 2022-03-07 NOTE — Evaluation (Signed)
Physical Therapy Evaluation Patient Details Name: Frederick Kirby MRN: [id number] DOB: 06-02-1935 Today's Date: 03/07/2022  History of Present Illness  Pt is 87 yo male presenting to ED after a fall at home. Pt had previously started feeling sick the day before with sore throat, cough and weakness. Pt tried to stand got dizzy and fell hitting his head. Pt tested positive for COVID 19. PMH: HTN, HLD, CAD/CABG on plavix, COPD and CVA  Clinical Impression  Pt is presenting close to baseline. Mod I to independent with all functional mobility. Recommended to pt to have close SBA for step to enter house when first going home and pt stated understanding. Due to pt current functional status, PLOF, home set up and available assistance at home no recommended skilled physical therapy on discharge from acute care hospital setting. Will sign off at this time and if new needs arise please re-consult.        Recommendations for follow up therapy are one component of a multi-disciplinary discharge planning process, led by the attending physician.  Recommendations may be updated based on patient status, additional functional criteria and insurance authorization.  Follow Up Recommendations No PT follow up      Assistance Recommended at Discharge PRN  Patient can return home with the following  Help with stairs or ramp for entrance    Equipment Recommendations None recommended by PT (pt has any needed equipment)  Recommendations for Other Services       Functional Status Assessment Patient has not had a recent decline in their functional status     Precautions / Restrictions Precautions Precautions: Fall;Other (comment) Precaution Comments: COVID 19 Restrictions Weight Bearing Restrictions: No      Mobility  Bed Mobility Overal bed mobility: Independent             General bed mobility comments: HOB flat no use of rails. Patient Response: Cooperative  Transfers Overall transfer level:  Modified independent Equipment used: None               General transfer comment: slightly increased BOS, denies dizziness/lightheadedness. Initially stated he felt a little which resolved after standing a minute. Receptive to education on pacing activities.    Ambulation/Gait Ambulation/Gait assistance: Modified independent (Device/Increase time) Gait Distance (Feet): 80 Feet Assistive device: None Gait Pattern/deviations: Step-through pattern, Wide base of support, Decreased stride length Gait velocity: decreased cadence. Gait velocity interpretation: <1.31 ft/sec, indicative of household ambulator   General Gait Details: Slight lateral sway no AD, no overt LOB with 2 turns. Wide BOS throughout with decrease heel strike and toeing off.  Stairs Stairs:  (pt educated on spouse staying close by when stepping up step at home. Demonstrates sufficient strength with sit to stand and gait to navigate stairs.)              Balance Overall balance assessment: Mild deficits observed, not formally tested           Pertinent Vitals/Pain Pain Assessment Pain Assessment: No/denies pain    Home Living Family/patient expects to be discharged to:: Private residence Living Arrangements: Spouse/significant other Available Help at Discharge: Family;Available 24 hours/day Type of Home: House Home Access: Stairs to enter   [employer] of Steps: 1 step   Home Layout: One level Home Equipment: Grab bars - tub/shower;Grab bars - toilet      Prior Function Prior Level of Function : Independent/Modified Independent  Extremity/Trunk Assessment   Upper Extremity Assessment Upper Extremity Assessment: Overall WFL for tasks assessed    Lower Extremity Assessment Lower Extremity Assessment: Overall WFL for tasks assessed    Cervical / Trunk Assessment Cervical / Trunk Assessment: Normal  Communication   Communication: HOH  Cognition  Arousal/Alertness: Awake/alert Behavior During Therapy: WFL for tasks assessed/performed Overall Cognitive Status: Within Functional Limits for tasks assessed          General Comments General comments (skin integrity, edema, etc.): Pt spouse can assist at home. Orthostatics taken Supine 141/68, Sitting 141/68, Standing 126/72 and after gait 125/70    Exercises     Assessment/Plan    PT Assessment Patient does not need any further PT services  PT Problem List         PT Treatment Interventions      PT Goals (Current goals can be found in the Care Plan section)  Acute Rehab PT Goals PT Goal Formulation: All assessment and education complete, DC therapy    Frequency          AM-PAC PT "6 Clicks" Mobility  Outcome Measure Help needed turning from your back to your side while in a flat bed without using bedrails?: None Help needed moving from lying on your back to sitting on the side of a flat bed without using bedrails?: None Help needed moving to and from a bed to a chair (including a wheelchair)?: None Help needed standing up from a chair using your arms (e.g., wheelchair or bedside chair)?: None Help needed to walk in hospital room?: None Help needed climbing 3-5 steps with a railing? : A Little 6 Click Score: 23    End of Session   Activity Tolerance: Patient tolerated treatment well Patient left: in bed;with call bell/phone within reach;with bed alarm set Nurse Communication: Mobility status      Time: [id number] PT Time Calculation (min) (ACUTE ONLY): 31 min   Charges:   PT Evaluation $PT Eval Low Complexity: 1 Low PT Treatments $Therapeutic Activity: 8-22 mins        Tomma Rakers, DPT, [place] Office: [phone] (Secure chat preferred)   Ander Purpura 03/07/2022, 11:39 AM

## 2022-03-10 ENCOUNTER — Encounter: Payer: Self-pay | Admitting: *Deleted

## 2022-03-10 ENCOUNTER — Telehealth: Payer: Self-pay | Admitting: *Deleted

## 2022-03-10 NOTE — Transitions of Care (Post Inpatient/ED Visit) (Signed)
   03/10/2022  Name: Frederick Kirby MRN: [id number] DOB: Jan 24, 1935  Today's TOC FU Call Status: Today's TOC FU Call Status:: Successful TOC FU Call Competed (Entirety of call today completed with patient's spouse Frederick Kirby; on [place]; HIPAA identifiers verified x 2) TOC FU Call Complete Date: 03/10/22  Transition Care Management Follow-up Telephone Call Date of Discharge: 03/07/22 Discharge Facility: [facility]) Type of Discharge: Inpatient Admission Primary Inpatient Discharge Diagnosis:: Acute Respiratory failure with hypoxia secondary to COVID 19 How have you been since you were released from the hospital?: Better Any questions or concerns?: No  Items Reviewed: Did you receive and understand the discharge instructions provided?: Yes (wife verbalizes good understanding of post-hospital discharge instructions) Medications obtained and verified?: Yes (Medications Reviewed) (spouse declines full medication review today- she is not near discharge papers; confirmed patient obtained/ is taking all newly Rx'd post-hospital discharge medications; self manages medications; denies concerns/ questions aorund medications) Any new allergies since your discharge?: No Dietary orders reviewed?: Yes Type of Diet Ordered:: heart healthy, low salt/ low cholesterol Do you have support at home?: Yes People in Home: spouse Name of Support/Comfort Primary Source: [place], on [place] and Equipment/Supplies: [place] Ordered?: No Any new equipment or medical supplies ordered?: No  Functional Questionnaire: Do you need assistance with bathing/showering or dressing?: No Do you need assistance with meal preparation?: No Do you need assistance with eating?: No Do you have difficulty maintaining continence: No Do you need assistance with getting out of bed/getting out of a chair/moving?: No Do you have difficulty managing or taking your medications?: No  Folllow up  appointments reviewed: PCP Follow-up appointment confirmed?: No (care coordination outreach with scheduling care guide team in real-time to schedule hospital follow up PCP appointment- no appointments available; scheduling care guide agrees to contact patient subsequently today to schedule) MD Provider Line Number:[phone] Given: No Specialist Hospital Follow-up appointment confirmed?: No (not indicated) Do you need transportation to your follow-up appointment?: No Do you understand care options if your condition(s) worsen?: Yes-patient verbalized understanding  SDOH Interventions Today    Flowsheet Row Most Recent Value  SDOH Interventions   Food Insecurity Interventions Intervention Not Indicated  Transportation Interventions Intervention Not Indicated  [spouse provides transportation post-recent hospitalization]      TOC Interventions Today    Flowsheet Row Most Recent Value  TOC Interventions   TOC Interventions Discussed/Reviewed TOC Interventions Discussed      Interventions Today    Flowsheet Row Most Recent Value  Chronic Disease   Chronic disease during today's visit Other  [COVID 19]  General Interventions   General Interventions Discussed/Reviewed General Interventions Discussed, Doctor Visits  [care coordination outreach in real-time with scheduling care guide team to facilitate scheduling of post-hospital follow up PCP office visit]  Doctor Visits Discussed/Reviewed Doctor Visits Discussed, PCP  PCP/Specialist Visits Compliance with follow-up visit  Nutrition Interventions   Nutrition Discussed/Reviewed Nutrition Discussed, Fluid intake  Pharmacy Interventions   Pharmacy Dicussed/Reviewed Pharmacy Topics Discussed  Noah Delaine med review- verified no concerns/ questions around medications]      Oneta Rack, RN, BSN, CCRN Alumnus RN CM Care Coordination/ Transition of [place] Management [phone]: direct office

## 2022-03-17 ENCOUNTER — Ambulatory Visit: Payer: Self-pay | Admitting: *Deleted

## 2022-03-17 NOTE — Telephone Encounter (Signed)
PCP aware

## 2022-03-17 NOTE — Telephone Encounter (Signed)
Summary: Fatigue, cough, & Dizziness   Pt Wife is calling to report that the pt was hospitalized from [place]. Reports that the pt is still fatigue- sleeps all day, with cough, and occasional dizziness. Please advise       Reason for Disposition  [1] PERSISTING SYMPTOMS OF COVID-19 AND [2] NO medical visit for COVID-19 in past 2 weeks  Answer Assessment - Initial Assessment Questions 1. COVID-19 ONSET: "When did the symptoms of COVID-19 first start?"     2 weeks ago -Tuesday 2. DIAGNOSIS CONFIRMATION: "How were you diagnosed?" (e.g., COVID-19 oral or nasal viral test; COVID-19 antibody test; doctor visit)     Diagnosed at home- and was taken to ED- hospitalized 3. MAIN SYMPTOM:  "What is your main concern or symptom right now?" (e.g., breathing difficulty, cough, fatigue. loss of smell)     Fatigue, cough 4. SYMPTOM ONSET: "When did the  cough  start?"     Cough- dry cough, fatigue 5. BETTER-SAME-WORSE: "Are you getting better, staying the same, or getting worse over the last 1 to 2 weeks?"     same 6. RECENT MEDICAL VISIT: "Have you been seen by a healthcare provider (doctor, NP, PA) for these persisting COVID-19 symptoms?" If Yes, ask: "When were you seen?" (e.g., date)     Only at ED- not hospital f/u 7. COUGH: "Do you have a cough?" If Yes, ask: "How bad is the cough?"       Dry cough- constant- throat c;earing 8. FEVER: "Do you have a fever?" If Yes, ask: "What is your temperature, how was it measured, and when did it start?"     no 9. BREATHING DIFFICULTY: "Are you having any trouble breathing?" If Yes, ask: "How bad is your breathing?" (e.g., mild, moderate, severe)    - MILD: No SOB at rest, mild SOB with walking, speaks normally in sentences, can lie down, no retractions, pulse < 100.    - MODERATE: SOB at rest, SOB with minimal exertion and prefers to sit, cannot lie down flat, speaks in phrases, mild retractions, audible wheezing, pulse 100-120.    - SEVERE: Very SOB at rest,  speaks in single words, struggling to breathe, sitting hunched forward, retractions, pulse > 120.       mild 10. OTHER SYMPTOMS: "Do you have any other symptoms?"  (e.g., fatigue, headache, muscle pain, weakness)       fatigue 11. HIGH RISK DISEASE: "Do you have any chronic medical problems?" (e.g., asthma, heart or lung disease, weak immune system, obesity, etc.)       yes  Protocols used: Coronavirus (COVID-19) Persisting Symptoms Follow-up Call-[facility]

## 2022-03-17 NOTE — Telephone Encounter (Signed)
  Chief Complaint: post COVID- fatigue, dry cough Symptoms: fatigue, dry cough Frequency: patient was admitted to hospital 2/21- discharged 2/23  Pertinent Negatives: Patient denies fever Disposition: '[]'$ ED /'[]'$ Urgent Care (no appt availability in office) / '[]'$ Appointment(In office/virtual)/ '[]'$  [place] [facility]/ '[]'$ Home Care/ '[]'$ Refused Recommended Disposition /'[]'$ [place] [facility]/ '[]'$  Follow-up with PCP Additional Notes: Patient is home now- he still has dry cough and fatigue. Patient is eating and hydrating. Patient discharged 2/23- he has taken antiviral and prednisone- using Tussen for cough. Patient has been scheduled for 03/26/22 1:20- advised please call if patient has any changes.

## 2022-03-17 NOTE — Telephone Encounter (Signed)
Patient given appointment for 02/25/2022

## 2022-03-26 ENCOUNTER — Ambulatory Visit (INDEPENDENT_AMBULATORY_CARE_PROVIDER_SITE_OTHER): Payer: PPO | Admitting: Family

## 2022-03-26 ENCOUNTER — Encounter: Payer: Self-pay | Admitting: Family

## 2022-03-26 VITALS — BP 117/68 | HR 78 | Temp 98.3°F | Resp 16 | Ht 68.11 in | Wt 174.0 lb

## 2022-03-26 DIAGNOSIS — J449 Chronic obstructive pulmonary disease, unspecified: Secondary | ICD-10-CM

## 2022-03-26 DIAGNOSIS — I951 Orthostatic hypotension: Secondary | ICD-10-CM | POA: Diagnosis not present

## 2022-03-26 DIAGNOSIS — W19XXXD Unspecified fall, subsequent encounter: Secondary | ICD-10-CM

## 2022-03-26 DIAGNOSIS — I25119 Atherosclerotic heart disease of native coronary artery with unspecified angina pectoris: Secondary | ICD-10-CM

## 2022-03-26 DIAGNOSIS — U071 COVID-19: Secondary | ICD-10-CM

## 2022-03-26 DIAGNOSIS — J9601 Acute respiratory failure with hypoxia: Secondary | ICD-10-CM | POA: Diagnosis not present

## 2022-03-26 DIAGNOSIS — R7401 Elevation of levels of liver transaminase levels: Secondary | ICD-10-CM

## 2022-03-26 DIAGNOSIS — N189 Chronic kidney disease, unspecified: Secondary | ICD-10-CM

## 2022-03-26 DIAGNOSIS — Z951 Presence of aortocoronary bypass graft: Secondary | ICD-10-CM

## 2022-03-26 DIAGNOSIS — Z8679 Personal history of other diseases of the circulatory system: Secondary | ICD-10-CM

## 2022-03-26 DIAGNOSIS — D509 Iron deficiency anemia, unspecified: Secondary | ICD-10-CM

## 2022-03-26 DIAGNOSIS — Z09 Encounter for follow-up examination after completed treatment for conditions other than malignant neoplasm: Secondary | ICD-10-CM

## 2022-03-26 DIAGNOSIS — Z0000 Encounter for general adult medical examination without abnormal findings: Secondary | ICD-10-CM

## 2022-03-26 NOTE — Progress Notes (Signed)
.  Pt presents for hospital follow-up  -pt states he is feeling a lot better

## 2022-03-26 NOTE — Progress Notes (Signed)
Patient ID: Frederick Kirby, male    DOB: 01-25-1935  MRN: [id number]  CC: Hospitalization Follow-up   Subjective: Frederick Kirby is a 87 y.o. male who presents for hospitalization follow-up. He is accompanied by his wife.   His concerns today include:  03/05/2022 - 03/07/2022 [facility] per MD note: Assessment and plan: COVID infection Acute respiratory failure with hypoxia  COPD Presented with URI, lethargy, hypoxia and COVID test positive at home chest x-ray without pneumonia.  Patient was started on remdesivir as well as Decadron.  2/22, I switched him to Paxlovid. Patient feels better.  Breathing on room air. Not complaining of any cough or shortness of breath or wheezing. WBC and inflammatory markers trend as below. Last Labs         Recent Labs  Lab 03/05/22 1640 03/05/22 1654 03/05/22 1658 03/05/22 1954 03/07/22 0421  SARSCOV2NAA  --  POSITIVE*  --   --   --   WBC 9.4  --   --  7.3 9.9  LATICACIDVEN  --   --  1.5  --   --   PROCALCITON  --   --   --  0.15  --   DDIMER  --   --   --  4.86*  --   LDH  --   --   --   --  152  ALT 61*  --   --   --  55*      Elevated D-dimer Significantly elevated D-dimer, probably due to COVID infection. No recent history of travel.  Not hypoxic at this time.   Orthostatic hypotension History of hypertension Symptomatic orthostatic hypotension and near syncope due to COVID PTA on metoprolol 25 mg daily, Norvasc 10 mg daily, lisinopril 2.5 g daily Currently is continued on metoprolol.  On hold on Norvasc and lisinopril. Blood pressure improved with hydration.  Okay to resume meds at home.   Falls Due to orthostatic hypotension Had abrasion in the right forehead.  No intracranial process and CT scan PT eval obtained.  No follow-up needs.   CAD/CABG Remote history of stroke No active chest pain.   CT head showed chronic right frontal cortical infarct.   PTA on Plavix 75 mg daily, Crestor 40 mg daily Continue  the same.   CKD 4 Creatinine at baseline.  Continue to monitor Recent Labs (within last 365 days)        Recent Labs    06/12/21 1029 03/05/22 1640 03/05/22 1954 03/07/22 0421  BUN 20 15  --  19  CREATININE 1.78* 1.79* 1.72* 1.27*      Transaminitis Likely due to COVID itself.  Continue to monitor. Last Labs       Recent Labs  Lab 03/05/22 1640 03/05/22 1954 03/07/22 0421  AST 52*  --  36  ALT 61*  --  55*  ALKPHOS 74  --  56  BILITOT 0.9  --  0.6  PROT 6.7  --  6.3*  ALBUMIN 3.7  --  3.1*  INR 1.1  --   --   PLT 162 150 167      Chronic iron deficiency Continue PPI and iron supplement Recent Labs (within last [place] visit 03/26/2022: Patient reports feeling improved since hospital discharge. He is taking his chronic conditions medications as prescribed. Checking blood pressures as needed outside of office and cannot recall results. He denies red flag symptoms such as but not limited to chest pain,  shortness of breath, worst headache of life, nausea/vomiting. No recent falls. Established with Cardiology and plans to schedule an appointment soon for annual visit. Nephrology appointment upcoming in April 2024. Established with Endocrinology.  No further issues/concerns for discussion today.   Patient Active Problem List   Diagnosis Date Noted   COVID-19 virus infection 03/05/2022   Orthostatic hypotension 03/05/2022   Nasal septal deviation 09/09/2021   Sensorineural hearing loss (SNHL) of both ears 09/09/2021   Diverticular disease of colon 07/22/2021   Diverticulosis of small intestine 07/22/2021   Anemia due to chronic blood loss 01/28/2021   Blood in feces 01/28/2021   Cholelithiasis 01/28/2021   Chronic obstructive pulmonary disease, unspecified ([place]) 01/28/2021   Gastrointestinal hemorrhage [id number]   Helicobacter pylori gastrointestinal tract infection 01/28/2021   History of duodenal ulcer [id number]   History of Helicobacter pylori infection  01/28/2021   Personal history of colonic polyps 01/28/2021   Right upper quadrant pain 01/28/2021   Coronary artery disease 01/28/2021   Other specified abnormal findings of blood chemistry 01/28/2021   Transaminitis 01/28/2021   Voice hoarseness 01/28/2021   Symptomatic anemia 11/10/2020   ARF (acute renal failure) ([place]) 11/10/2020   Elevated LFTs 11/10/2020   Hoarseness 11/10/2020   Carotid bruit 10/17/2014   CAD (coronary artery disease) 12/16/2012   CAD (coronary artery disease) of artery bypass graft 12/16/2012   HTN (hypertension) 12/16/2012   CKD (chronic kidney disease) stage 4, GFR 15-29 ml/min ([place]) 12/16/2012   Hyperlipidemia with target LDL less than 70 12/16/2012   Iliac artery aneurysm ([place]) 12/16/2012     Current Outpatient Medications on File Prior to Visit  Medication Sig Dispense Refill   amLODipine (NORVASC) 10 MG tablet TAKE 1 TABLET BY MOUTH EVERY DAY (Patient taking differently: Take 10 mg by mouth daily.) 90 tablet 3   clopidogrel (PLAVIX) 75 MG tablet Take 1 tablet (75 mg total) by mouth daily. 90 tablet 3   diphenhydramine-acetaminophen (TYLENOL PM) 25-500 MG TABS tablet Take 1 tablet by mouth at bedtime.     Ferrous Sulfate (IRON PO) Take 6.5 mg by mouth daily.     fluticasone (FLONASE) 50 MCG/ACT nasal spray Place 1 spray into both nostrils daily as needed for allergies.     FOLBIC 2.5-25-2 MG TABS tablet TAKE 1 TABLET BY MOUTH EVERY DAY 90 tablet 3   guaiFENesin-dextromethorphan (ROBITUSSIN DM) 100-10 MG/5ML syrup Take 5 mLs by mouth every 4 (four) hours as needed for cough. 118 mL 0   lisinopril (ZESTRIL) 2.5 MG tablet TAKE 1 TABLET BY MOUTH EVERY DAY 90 tablet 3   magnesium hydroxide (MILK OF MAGNESIA) 400 MG/5ML suspension Take 15 mLs by mouth 3 (three) times a week.     metoprolol succinate (TOPROL-XL) 25 MG 24 hr tablet TAKE 1 TABLET (25 MG TOTAL) BY MOUTH DAILY. 90 tablet 2   Omega-3 Fatty Acids (FISH OIL) 1200 MG CAPS Take 1 capsule (1,200 mg total)  by mouth 2 (two) times daily. (Patient taking differently: Take 1 capsule by mouth daily.) 180 capsule 3   pantoprazole (PROTONIX) 40 MG tablet Take 1 tablet (40 mg total) by mouth daily. 90 tablet 3   rosuvastatin (CRESTOR) 40 MG tablet TAKE 1 TABLET BY MOUTH EVERY DAY (Patient taking differently: Take 40 mg by mouth daily.) 90 tablet 3   sodium chloride (OCEAN) 0.65 % SOLN nasal spray Place 1 spray into both nostrils daily as needed for congestion.     No current facility-administered medications on file prior to visit.  Allergies  Allergen Reactions   Niacin Other (See Comments)    Turned red & his skin burned   Niaspan [Niacin Er]     Turned red & his skin burned    Social History   Socioeconomic History   Marital status: Married    Spouse name: Not on file   Number of children: Not on file   Years of education: Not on file   Highest education level: Not on file  Occupational History   Not on file  Tobacco Use   Smoking status: Former    Types: Cigarettes    Quit date: 01/13/2005    Years since quitting: 17.2    Passive exposure: Past   Smokeless tobacco: Never  Vaping Use   Vaping Use: Never used  Substance and Sexual Activity   Alcohol use: No   Drug use: No   Sexual activity: Not Currently  Other Topics Concern   Not on file  Social History Narrative   Not on file   Social Determinants of Health   Financial Resource Strain: Not on file  Food Insecurity: No Food Insecurity (03/10/2022)   Hunger Vital Sign    Worried About Running Out of Food in the Last Year: Never true    Ran Out of Food in the Last Year: Never true  Transportation Needs: No Transportation Needs (03/10/2022)   PRAPARE - Hydrologist (Medical): No    Lack of Transportation (Non-Medical): No  Physical Activity: Not on file  Stress: Not on file  Social Connections: Not on file  Intimate Partner Violence: Not on file    Family History  Problem Relation Age of  Onset   Cancer Mother     Past Surgical History:  Procedure Laterality Date   BIOPSY  02/26/2021   Procedure: BIOPSY;  Surgeon: Clarene Essex, MD;  Location: [facility];  Service: Endoscopy;;   CAROTID DOPPLER  12/2011   Right Bulb/Prox ICA 50-69% diameter reduction; Left Bulb/Prox ICA 0-49% diameter reduction   COLONOSCOPY WITH PROPOFOL N/A 02/26/2021   Procedure: COLONOSCOPY WITH PROPOFOL;  Surgeon: Clarene Essex, MD;  Location: [facility];  Service: Endoscopy;  Laterality: N/A;   CORONARY ANGIOPLASTY WITH STENT PLACEMENT  04/28/2003   L Cfx with 99% prox lesion in AV groove; patent SVG to ramus intermedius w/ 95% distal lesion; SVG to OM totally occluded; SVG to RCA totally occluded; native RCA occluded in prox region w/collaterals from LAD into PDA; Taxus 2.5x17m stent to distal VG to ramus (Dr. RGerrie Nordmann   CORONARY ARTERY BYPASS GRAFT  01/1992   LIMA-LAD & diagonal, SVG to diagonal, SVG to OM1 & OM2, VG to PDA & PLA (Dr. WRedmond Pulling   ESOPHAGOGASTRODUODENOSCOPY (EGD) WITH PROPOFOL N/A 02/26/2021   Procedure: ESOPHAGOGASTRODUODENOSCOPY (EGD) WITH PROPOFOL;  Surgeon: MClarene Essex MD;  Location: [facility];  Service: Endoscopy;  Laterality: N/A;   NM MYOCAR PERF WALL MOTION  11/2011   lexiscan - normal study, EF 80% post-stress   POLYPECTOMY  02/26/2021   Procedure: POLYPECTOMY;  Surgeon: MClarene Essex MD;  Location: [facility];  Service: Endoscopy;;   RENAL DOPPLER  12/2011   Right CIA with aneursymal dilatation 2.3x3.1   TRANSTHORACIC ECHOCARDIOGRAM  10/2008   EF 50-55%, borderline conc LVH, mild posterior wall hypokinesis; mild mitral annular calcification, prolapse of anterior leaflets, mild MR; RVSP 30-433mg; mild TR; mild calcification of AV leaflets    ROS: Review of Systems Negative except as stated above  PHYSICAL EXAM: BP 117/68 (  BP Location: Left Arm, Patient Position: Sitting, Cuff Size: Large)   Pulse 78   Temp 98.3 F (36.8 C)   Resp 16   Ht 5' 8.11" (1.73 m)   Wt  174 lb (78.9 kg)   SpO2 94%   BMI 26.37 kg/m   Physical Exam HENT:     Head: Normocephalic and atraumatic.  Eyes:     Extraocular Movements: Extraocular movements intact.     Conjunctiva/sclera: Conjunctivae normal.     Pupils: Pupils are equal, round, and reactive to light.  Cardiovascular:     Rate and Rhythm: Normal rate and regular rhythm.     Pulses: Normal pulses.     Heart sounds: Normal heart sounds.  Pulmonary:     Effort: Pulmonary effort is normal.     Breath sounds: Normal breath sounds.  Musculoskeletal:     Cervical back: Normal range of motion and neck supple.  Neurological:     General: No focal deficit present.     Mental Status: He is alert and oriented to person, place, and time.  Psychiatric:        Mood and Affect: Mood normal.        Behavior: Behavior normal.     ASSESSMENT AND PLAN: 1. Hospital discharge follow-up - Reviewed hospital course, current medications, ensured proper follow-up in place, and addressed concerns.   2. COVID-19 virus infection 3. Acute respiratory failure with hypoxia (H[facility]) 4. Chronic obstructive pulmonary disease, unspecified COPD type ([place]) - Patient today in office with no cardiopulmonary distress.  - Patient improved and back to baseline since hospital discharge.  - Follow-up with primary provider as scheduled.   5. Orthostatic hypotension 6. History of hypertension 7. Coronary artery disease involving native coronary artery of native heart with angina pectoris ([place]) 8. Hx of CABG - Continue present management. - Counseled on blood pressure goal of less than 140/90, low-sodium, DASH diet, medication compliance, 150 minutes of moderate intensity exercise per week as tolerated. Discussed medication compliance, adverse effects. - Keep all scheduled appointments with Cardiology.   9. Chronic kidney disease, unspecified CKD stage - Keep all scheduled appointments with Nephrology.   10. Transaminitis - Routine  screening.  - AST; Future - ALT; Future  11. Chronic iron deficiency anemia - Continue present management.  - Follow-up with primary provider as scheduled.   81. Fall, subsequent encounter - Patient denies recent falls since hospital discharge.    Patient was given the opportunity to ask questions.  Patient verbalized understanding of the plan and was able to repeat key elements of the plan. Patient was given clear instructions to go to Emergency Department or return to medical center if symptoms don't improve, worsen, or new problems develop.The patient verbalized understanding.   Orders Placed This Encounter  Procedures   AST   ALT    Return for Follow-Up or next available 04/03/2022 lab appointment.  Camillia Herter, NP

## 2022-03-27 ENCOUNTER — Ambulatory Visit (INDEPENDENT_AMBULATORY_CARE_PROVIDER_SITE_OTHER): Payer: PPO

## 2022-03-27 DIAGNOSIS — Z0000 Encounter for general adult medical examination without abnormal findings: Secondary | ICD-10-CM | POA: Diagnosis not present

## 2022-03-27 NOTE — Progress Notes (Signed)
.I connected with  [name] on 03/27/22 by a audio enabled telemedicine application and verified that I am speaking with the correct person using two identifiers.  Patient Location: Home  Provider Location: Office/Clinic  I discussed the limitations of evaluation and management by telemedicine. The patient expressed understanding and agreed to proceed.     Subjective:   Frederick Kirby is a 87 y.o. male who presents for Medicare Annual/Subsequent preventive examination.  Review of Systems    Defer to PCP  Cardiac Risk Factors include: advanced age (>56mn, >>75women);male gender;smoking/ tobacco exposure     Objective:    Today's Vitals   03/27/22 1457  PainSc: 0-No pain   There is no height or weight on file to calculate BMI.     02/26/2021   10:59 AM 11/12/2020    1:56 PM 12/04/2013    3:25 AM  Advanced Directives  Does Patient Have a Medical Advance Directive? No No Yes  Type of Advance Directive   HCaledoniain Chart?   No - copy requested  Would patient like information on creating a medical advance directive? No - Patient declined No - Patient declined     Current Medications (verified) Outpatient Encounter Medications as of 03/27/2022  Medication Sig   amLODipine (NORVASC) 10 MG tablet TAKE 1 TABLET BY MOUTH EVERY DAY (Patient taking differently: Take 10 mg by mouth daily.)   clopidogrel (PLAVIX) 75 MG tablet Take 1 tablet (75 mg total) by mouth daily.   diphenhydramine-acetaminophen (TYLENOL PM) 25-500 MG TABS tablet Take 1 tablet by mouth at bedtime.   Ferrous Sulfate (IRON PO) Take 6.5 mg by mouth daily.   fluticasone (FLONASE) 50 MCG/ACT nasal spray Place 1 spray into both nostrils daily as needed for allergies.   FOLBIC 2.5-25-2 MG TABS tablet TAKE 1 TABLET BY MOUTH EVERY DAY   guaiFENesin-dextromethorphan (ROBITUSSIN DM) 100-10 MG/5ML syrup Take 5 mLs by mouth every 4 (four) hours as needed for cough.    lisinopril (ZESTRIL) 2.5 MG tablet TAKE 1 TABLET BY MOUTH EVERY DAY   magnesium hydroxide (MILK OF MAGNESIA) 400 MG/5ML suspension Take 15 mLs by mouth 3 (three) times a week.   metoprolol succinate (TOPROL-XL) 25 MG 24 hr tablet TAKE 1 TABLET (25 MG TOTAL) BY MOUTH DAILY.   Omega-3 Fatty Acids (FISH OIL) 1200 MG CAPS Take 1 capsule (1,200 mg total) by mouth 2 (two) times daily. (Patient taking differently: Take 1 capsule by mouth daily.)   pantoprazole (PROTONIX) 40 MG tablet Take 1 tablet (40 mg total) by mouth daily.   rosuvastatin (CRESTOR) 40 MG tablet TAKE 1 TABLET BY MOUTH EVERY DAY (Patient taking differently: Take 40 mg by mouth daily.)   sodium chloride (OCEAN) 0.65 % SOLN nasal spray Place 1 spray into both nostrils daily as needed for congestion.   No facility-administered encounter medications on file as of 03/27/2022.    Allergies (verified) Niacin and Niaspan [niacin er]   History: Past Medical History:  Diagnosis Date   [place]    Coronary artery disease    Hyperlipidemia    Hypertension    Mild renal insufficiency    [place]    S/P CABG (coronary artery bypass graft) 1994   [facility]    Past Surgical History:  Procedure Laterality Date   BIOPSY  02/26/2021   Procedure: BIOPSY;  Surgeon: MClarene Essex MD;  Location: [facility];  Service: Endoscopy;;   CAROTID DOPPLER  12/2011   Right Bulb/Prox ICA 50-69% diameter reduction; Left Bulb/Prox ICA 0-49% diameter reduction   COLONOSCOPY WITH PROPOFOL N/A 02/26/2021   Procedure: COLONOSCOPY WITH PROPOFOL;  Surgeon: Clarene Essex, MD;  Location: [facility];  Service: Endoscopy;  Laterality: N/A;   CORONARY ANGIOPLASTY WITH STENT PLACEMENT  04/28/2003   L Cfx with 99% prox lesion in AV groove; patent SVG to ramus intermedius w/ 95% distal lesion; SVG to OM totally occluded; SVG to RCA totally occluded; native RCA occluded in prox region w/collaterals from LAD  into PDA; Taxus 2.5x27m stent to distal VG to ramus (Dr. RGerrie Nordmann   CORONARY ARTERY BYPASS GRAFT  01/1992   LIMA-LAD & diagonal, SVG to diagonal, SVG to OM1 & OM2, VG to PDA & PLA (Dr. WRedmond Pulling   ESOPHAGOGASTRODUODENOSCOPY (EGD) WITH PROPOFOL N/A 02/26/2021   Procedure: ESOPHAGOGASTRODUODENOSCOPY (EGD) WITH PROPOFOL;  Surgeon: MClarene Essex MD;  Location: [facility];  Service: Endoscopy;  Laterality: N/A;   NM MYOCAR PERF WALL MOTION  11/2011   lexiscan - normal study, EF 80% post-stress   POLYPECTOMY  02/26/2021   Procedure: POLYPECTOMY;  Surgeon: MClarene Essex MD;  Location: [facility];  Service: Endoscopy;;   RENAL DOPPLER  12/2011   Right CIA with aneursymal dilatation 2.3x3.1   TRANSTHORACIC ECHOCARDIOGRAM  10/2008   EF 50-55%, borderline conc LVH, mild posterior wall hypokinesis; mild mitral annular calcification, prolapse of anterior leaflets, mild MR; RVSP 30-459mg; mild TR; mild calcification of AV leaflets   Family History  Problem Relation Age of Onset   Cancer Mother    Social History   Socioeconomic History   Marital status: Married    Spouse name: Not on file   Number of children: Not on file   Years of education: Not on file   Highest education level: Not on file  Occupational History   Not on file  Tobacco Use   Smoking status: Former    Types: Cigarettes    Quit date: 01/13/2005    Years since quitting: 17.2    Passive exposure: Past   Smokeless tobacco: Never  Vaping Use   Vaping Use: Never used  Substance and Sexual Activity   Alcohol use: No   Drug use: No   Sexual activity: Not Currently  Other Topics Concern   Not on file  Social History Narrative   Not on file   Social Determinants of Health   Financial Resource Strain: Not on file  Food Insecurity: No Food Insecurity (03/27/2022)   Hunger Vital Sign    Worried About Running Out of Food in the Last Year: Never true    Ran Out of Food in the Last Year: Never true  Transportation Needs: No  Transportation Needs (03/10/2022)   PRAPARE - TrHydrologistMedical): No    Lack of Transportation (Non-Medical): No  Physical Activity: Not on file  Stress: Not on file  Social Connections: Not on file    Tobacco Counseling Counseling given: Not Answered   Clinical Intake:  Pre-visit preparation completed: Yes  Pain : 0-10 Pain Score: 0-No pain     Diabetes: No  How often do you need to have someone help you when you read instructions, pamphlets, or other written materials from your doctor or pharmacy?: 1 - Never  Diabetic? NO   Interpreter Needed?: No      Activities of Daily Living    03/27/2022    3:07 PM  In  your present state of health, do you have any difficulty performing the following activities:  Hearing? 1  Vision? 0  Difficulty concentrating or making decisions? 0  Walking or climbing stairs? 0  Dressing or bathing? 0  Doing errands, shopping? 0  Preparing Food and eating ? N  Using the Toilet? N  In the past six months, have you accidently leaked urine? N  Do you have problems with loss of bowel control? N  Managing your Medications? N  Managing your Finances? N  Housekeeping or managing your Housekeeping? N    Patient Care Team: Camillia Herter, NP as PCP - General (Nurse Practitioner) [name], MD as PCP - Cardiology (Cardiology)  Indicate any recent Medical Services you may have received from other than [facility] providers in the past year (date may be approximate).     Assessment:   This is a routine wellness examination for [place].  Hearing/Vision screen No results found.  Dietary issues and exercise activities discussed: Current Exercise Habits: The patient does not participate in regular exercise at present, Exercise limited by: cardiac condition(s);orthopedic condition(s)   Goals Addressed   None   Depression Screen    03/26/2022    1:28 PM 01/28/2021    1:58 PM  PHQ 2/9 Scores  PHQ - 2 Score 0  0    Fall Risk    03/27/2022    3:06 PM  [place] in the past year? 1  Number falls in past yr: 1  Injury with Fall? 1  Risk for fall due to : History of fall(s)  Follow up Education provided;Falls prevention discussed    FALL RISK PREVENTION PERTAINING TO THE HOME:  Any stairs in or around the home? No  If so, are there any without handrails?  N/A Home free of loose throw rugs in walkways, pet beds, electrical cords, etc? Yes  Adequate lighting in your home to reduce risk of falls? Yes   ASSISTIVE DEVICES UTILIZED TO PREVENT FALLS:  Life alert? No  Use of a cane, walker or w/c? No  Grab bars in the bathroom? No  Shower chair or bench in shower? No  Elevated toilet seat or a handicapped toilet? No   TIMED UP AND GO:  Was the test performed? No .  Length of time to ambulate 10 feet: N/A sec.     Cognitive Function:        03/27/2022    3:07 PM  6CIT Screen  What Year? 0 points  What month? 0 points  What time? 0 points  Count back from 20 0 points  Months in reverse 0 points  Repeat phrase 0 points  Total Score 0 points    Immunizations Immunization History  Administered Date(s) Administered   Fluad Quad(high Dose 65+) 09/22/2018, 12/30/2021   Influenza, High Dose Seasonal PF 09/26/2017, 11/05/2020   PFIZER(Purple Top)SARS-COV-2 Vaccination 05/10/2019, 05/31/2019   Pfizer Covid-19 Vaccine Bivalent Booster 4yr & up 12/14/2019   Tdap 03/05/2022    TDAP status: Up to date  Flu Vaccine status: Up to date  Pneumococcal vaccine status: Due, Education has been provided regarding the importance of this vaccine. Advised may receive this vaccine at local pharmacy or Health Dept. Aware to provide a copy of the vaccination record if obtained from local pharmacy or Health Dept. Verbalized acceptance and understanding.  Covid-19 vaccine status: Completed vaccines  Qualifies for Shingles Vaccine? Yes   Zostavax completed No   Shingrix Completed?: No.  Education has been provided regarding the importance of this vaccine. Patient has been advised to call insurance company to determine out of pocket expense if they have not yet received this vaccine. Advised may also receive vaccine at local pharmacy or Health Dept. Verbalized acceptance and understanding.  Screening Tests Health Maintenance  Topic Date Due   Pneumonia Vaccine 78+ Years old (1 of 2 - PCV) Never done   Zoster Vaccines- Shingrix (1 of 2) Never done   COVID-19 Vaccine (4 - 2023-24 season) 09/13/2021   Medicare Annual Wellness (AWV)  03/27/2023   DTaP/Tdap/Td (2 - Td or Tdap) 03/05/2032   INFLUENZA VACCINE  Completed   HPV VACCINES  Aged Out    Health Maintenance  Health Maintenance Due  Topic Date Due   Pneumonia Vaccine 88+ Years old (1 of 2 - PCV) Never done   Zoster Vaccines- Shingrix (1 of 2) Never done   COVID-19 Vaccine (4 - 2023-24 season) 09/13/2021    Colorectal cancer screening: No longer required.   Lung Cancer Screening: (Low Dose CT Chest recommended if Age 77-80 years, 30 pack-year currently smoking OR have quit w/in 15years.) does not qualify.   Lung Cancer Screening Referral: N/A  Additional Screening:  Hepatitis C Screening: does qualify; Completed 11/11/2020  Vision Screening: Recommended annual ophthalmology exams for early detection of glaucoma and other disorders of the eye. Is the patient up to date with their annual eye exam?  Yes  Who is the provider or what is the name of the office in which the patient attends annual eye exams? Pt states ophthalmologist is located in [place], [place] did not know name. If pt is not established with a provider, would they like to be referred to a provider to establish care?  N/A .   Dental Screening: Recommended annual dental exams for proper oral hygiene  Community Resource Referral / Chronic Care Management: CRR required this visit?  No   CCM required this visit?  No      Plan:     I have  personally reviewed and noted the following in the patient's chart:   Medical and social history Use of alcohol, tobacco or illicit drugs  Current medications and supplements including opioid prescriptions. Patient is not currently taking opioid prescriptions. Functional ability and status Nutritional status Physical activity Advanced directives List of other physicians Hospitalizations, surgeries, and ER visits in previous 12 months Vitals Screenings to include cognitive, depression, and falls Referrals and appointments  In addition, I have reviewed and discussed with patient certain preventive protocols, quality metrics, and best practice recommendations. A written personalized care plan for preventive services as well as general preventive health recommendations were provided to patient.     Elmon Else, [facility]   03/27/2022   Nurse Notes: .Non-Face to face 30 minute visit   . Mr. Spore , Thank you for taking time to come for your Medicare Wellness Visit. I appreciate your ongoing commitment to your health goals. Please review the following plan we discussed and let me know if I can assist you in the future.   These are the goals we discussed:  Goals   None     This is a list of the screening recommended for you and due dates:  Health Maintenance  Topic Date Due   Pneumonia Vaccine (1 of 2 - PCV) Never done   Zoster (Shingles) Vaccine (1 of 2) Never done   COVID-19 Vaccine (4 - 2023-24 season) 09/13/2021   Medicare  Annual Wellness Visit  03/27/2023   DTaP/Tdap/Td vaccine (2 - Td or Tdap) 03/05/2032   Flu Shot  Completed   HPV Vaccine  Aged Out

## 2022-04-03 ENCOUNTER — Other Ambulatory Visit: Payer: PPO

## 2022-04-04 ENCOUNTER — Other Ambulatory Visit: Payer: PPO

## 2022-04-04 DIAGNOSIS — R7401 Elevation of levels of liver transaminase levels: Secondary | ICD-10-CM

## 2022-04-05 LAB — AST: AST: 28 IU/L (ref 0–40)

## 2022-04-05 LAB — ALT: ALT: 32 IU/L (ref 0–44)

## 2022-04-20 NOTE — Progress Notes (Unsigned)
Cardiology Clinic Note   Date: 04/21/2022 ID: SHMAR FOCHS, DOB [birth year], MRN [id number]  Primary Cardiologist:  Nicki Guadalajara, MD  Patient Profile    Frederick Kirby is a 87 y.o. male who presents to the clinic today for routine follow-up .  Past medical history significant for: CAD. CABG x 20 January 1992: Sequential LIMA to LAD and diagonal, SVG to diagonal, sequential SVG to OM1 and OM 2, sequential SVG to PDA and posterior lateral branch. LHC 04/28/2003: Successful placement of stent distal vein graft to ramus intermedius.  Patent vein graft to LAD with large bridging collaterals to the distal RCA.  Totally occluded vein graft to OM.  Totally occluded vein graft to RCA.  Normal LV systolic function.  Patent left subclavian.  Elevated LVEDP. Echo 11/12/2020: EF 60 to 65%.  Mild LVH basal-septal segment.  Trivial MR.  Mild to moderate AS, mean gradient 16 mmHg.  Mild to moderate LAE. Carotid artery disease. Carotid ultrasound 03/29/2021: Bilateral ICA 40 to 59%.  Unchanged from March 2022. Iliac artery aneurysm. AAA ultrasound 03/29/2021: No evidence of AAA.  Evidence of abnormal dilatation of right common iliac artery 2.1 cm.  Widely patent bilateral common and external iliac arteries without evidence of focal stenosis.  Stable from March 2022. Hyperlipidemia. Lipid panel 03/15/2020: LDL 82, HDL 45, TG 142, total 152. CKD stage IV.   History of Present Illness    Frederick Kirby is a longtime patient of cardiology.  He is followed by Dr. Tresa Endo for the above outlined history.  Last seen in the office by Dr. Tresa Endo on 06/06/2021.  At that time he was doing well.  No medication changes were made.  Patient had a visit to the ED on 03/05/2022 after falling at home in the setting of positive COVID infection.  Patient reported he got up quickly from the bed to go to the bathroom became dizzy and fell injuring his forehead and left elbow.  He did not lose consciousness. CT head showed a small frontal  scalp hematoma otherwise radiologic testing was negative.  He was admitted secondary to orthostasis, fever, low O2 sat.  Patient had elevated D-dimer likely secondary to COVID infection.  He received IV fluids.  He was discharged 03/07/2022.  Today, patient is accompanied by his wife. Patient denies shortness of breath or dyspnea on exertion. No chest pain, pressure, or tightness. Denies lower extremity edema, orthopnea, or PND. No palpitations.  No lightheadedness, dizziness, presyncope, or syncope.  He does not do any formal exercise.  He has no concerns today.      ROS: All other systems reviewed and are otherwise negative except as noted in History of Present Illness.  Studies Reviewed    ECG personally reviewed by me today: Sinus rhythm with first-degree AV block, PACs, 93 bpm.  No significant changes from 06/06/2021.           Physical Exam    VS:  BP (!) 118/58   Pulse 96   Ht 5\' 9"  (1.753 m)   Wt 169 lb (76.7 kg)   SpO2 97%   BMI 24.96 kg/m  , BMI Body mass index is 24.96 kg/m.  GEN: Well nourished, well developed, in no acute distress. Neck: No JVD or carotid bruits. Cardiac:  RRR. No murmurs. No rubs or gallops.   Respiratory:  Respirations regular and unlabored. Clear to auscultation without rales, wheezing or rhonchi. GI: Soft, nontender, nondistended. Extremities: Radials/DP/PT 2+ and equal bilaterally. No clubbing or cyanosis.  No edema.  Skin: Warm and dry, no rash. Neuro: Strength intact.  Assessment & Plan   CAD.  S/p CABG times 20 January 1992.  LHC April 2005 PCI with stent to distal vein graft to ramus intermedius.  Patent vein graft to LAD with large bridging collaterals to the distal RCA.  Totally occluded vein graft to OM, totally occluded vein graft to RCA.  Patient denies chest pain, pressure, tightness.  Continue Plavix, metoprolol, Crestor.  Carotid artery disease. Carotid US March 2023 showed bilateral ICA 40 to 59%.  Unchanged from March 2022.  Patient  denies lightheadedness, dizziness, presyncope, syncope.  Will get repeat ultrasound. Iliac artery aneurysm.  AAA ultrasound March 2023 showed abnormal dilatation of right common iliac artery 2.1 cm, widely patent bilateral common and external iliac arteries without evidence of focal stenosis.  Stable from March 2022.  Will get repeat AAA ultrasound. Hyperlipidemia.  LDL March 2022 82, not at goal.  Continue rosuvastatin.  Will get a repeat lipid panel and direct LDL today.  Disposition: Lipid panel and direct LDL today.  Return in 1 year or sooner as needed.         Signed, Etta Grandchild. Evyn Putzier, DNP, NP-C

## 2022-04-21 ENCOUNTER — Ambulatory Visit: Payer: PPO | Attending: Student | Admitting: Student

## 2022-04-21 ENCOUNTER — Encounter: Payer: Self-pay | Admitting: Student

## 2022-04-21 VITALS — BP 118/58 | HR 96 | Ht 69.0 in | Wt 169.0 lb

## 2022-04-21 DIAGNOSIS — I723 Aneurysm of iliac artery: Secondary | ICD-10-CM | POA: Diagnosis not present

## 2022-04-21 DIAGNOSIS — I6523 Occlusion and stenosis of bilateral carotid arteries: Secondary | ICD-10-CM | POA: Diagnosis not present

## 2022-04-21 DIAGNOSIS — I2510 Atherosclerotic heart disease of native coronary artery without angina pectoris: Secondary | ICD-10-CM | POA: Diagnosis not present

## 2022-04-21 DIAGNOSIS — E785 Hyperlipidemia, unspecified: Secondary | ICD-10-CM

## 2022-04-21 NOTE — Addendum Note (Signed)
Addended by: Brunetta Genera on: 04/21/2022 02:09 PM   Modules accepted: Orders

## 2022-04-21 NOTE — Patient Instructions (Addendum)
Medication Instructions:  No Changes *If you need a refill on your cardiac medications before your next appointment, please call your pharmacy*   Lab Work: Your physician recommends that you have lab work today: Lipid Panel Direct LDL   If you have labs (blood work) drawn today and your tests are completely normal, you will receive your results only by: MyChart Message (if you have MyChart) OR A paper copy in the mail If you have any lab test that is abnormal or we need to change your treatment, we will call you to review the results.   Testing/Procedures: Your physician has requested that you have a carotid duplex. This test is an ultrasound of the carotid arteries in your neck. It looks at blood flow through these arteries that supply the brain with blood. Allow one hour for this exam. There are no restrictions or special instructions.   Your physician has requested that you have an abdominal aorta duplex. During this test, an ultrasound is used to evaluate the aorta. Allow 30 minutes for this exam. Do not eat after midnight the day before and avoid carbonated beverages     Follow-Up: At [facility], you and your health needs are our priority.  As part of our continuing mission to provide you with exceptional heart care, we have created designated Provider Care Teams.  These Care Teams include your primary Cardiologist (physician) and Advanced Practice Providers (APPs -  Physician Assistants and Nurse Practitioners) who all work together to provide you with the care you need, when you need it.  We recommend signing up for the patient portal called "MyChart".  Sign up information is provided on this After Visit Summary.  MyChart is used to connect with patients for Virtual Visits (Telemedicine).  Patients are able to view lab/test results, encounter notes, upcoming appointments, etc.  Non-urgent messages can be sent to your provider as well.   To learn more about what you can do  with MyChart, go to [email or web address].    Your next appointment:   1 year(s)  Provider:   Nicki Guadalajara, MD  or Carlos Levering, NP

## 2022-04-22 LAB — LIPID PANEL
Chol/HDL Ratio: 3.7 ratio (ref 0.0–5.0)
Cholesterol, Total: 168 mg/dL (ref 100–199)
HDL: 46 mg/dL (ref >39)
LDL Chol Calc (NIH): 92 mg/dL (ref 0–99)
Triglycerides: 171 mg/dL — ABNORMAL HIGH (ref 0–149)
VLDL Cholesterol Cal: 30 mg/dL (ref 5–40)

## 2022-04-22 LAB — LDL CHOLESTEROL, DIRECT: LDL Direct: 91 mg/dL (ref 0–99)

## 2022-04-23 ENCOUNTER — Other Ambulatory Visit: Payer: Self-pay

## 2022-04-23 DIAGNOSIS — E785 Hyperlipidemia, unspecified: Secondary | ICD-10-CM

## 2022-04-23 MED ORDER — EZETIMIBE 10 MG PO TABS: 10.0000 mg | ORAL_TABLET | Freq: Every day | ORAL | 3 refills | Status: DC

## 2022-04-28 NOTE — Addendum Note (Signed)
Addended by: Daryll Brod on: 04/28/2022 09:02 AM   Modules accepted: Orders

## 2022-04-28 NOTE — Addendum Note (Signed)
Addended by: Daryll Brod on: 04/28/2022 09:05 AM   Modules accepted: Orders

## 2022-05-06 ENCOUNTER — Encounter: Payer: Self-pay | Admitting: Internal Medicine

## 2022-05-06 ENCOUNTER — Ambulatory Visit: Payer: PPO | Admitting: Internal Medicine

## 2022-05-06 VITALS — BP 120/66 | HR 75 | Ht 69.0 in | Wt 173.2 lb

## 2022-05-06 DIAGNOSIS — E038 Other specified hypothyroidism: Secondary | ICD-10-CM | POA: Diagnosis not present

## 2022-05-06 LAB — T4, FREE: Free T4: 0.93 ng/dL (ref 0.60–1.60)

## 2022-05-06 LAB — TSH: TSH: 4.68 u[IU]/mL (ref 0.35–5.50)

## 2022-05-06 NOTE — Progress Notes (Signed)
Name: Frederick Kirby  MRN/ DOB: [id number], November 09, 1935    Age/ Sex: 87 y.o., male    PCP: Rema Fendt, NP   Reason for Endocrinology Evaluation: Hypothyroidism     Date of Initial Endocrinology Evaluation: 05/06/2022     HPI: Mr. Frederick Kirby is a 87 y.o. male with a past medical history of CAD, HTN,  and dyslipidemia. The patient presented for initial endocrinology clinic visit on 05/06/2022 for consultative assistance with his Hypothyroidism.   Patient has been noted with elevated TSH since 2015, his numbers started to trend up with a max level of 8.2 u IU/mL 05/2021  He is accompanied by his spouse today  Denies fatigue  He has occasional constipation  Denies palpitations  Denies local neck swelling  NO Vitamins  No amiodarone   No Fh of thyroid disease  HISTORY:  Past Medical History:  Past Medical History:  Diagnosis Date   COPD (chronic obstructive pulmonary disease)    Coronary artery disease    Hyperlipidemia    Hypertension    Mild renal insufficiency    Myocardial infarction    S/P CABG (coronary artery bypass graft) 1994   Stroke    Past Surgical History:  Past Surgical History:  Procedure Laterality Date   BIOPSY  02/26/2021   Procedure: BIOPSY;  Surgeon: Vida Rigger, MD;  Location: [facility];  Service: Endoscopy;;   CAROTID DOPPLER  12/2011   Right Bulb/Prox ICA 50-69% diameter reduction; Left Bulb/Prox ICA 0-49% diameter reduction   COLONOSCOPY WITH PROPOFOL N/A 02/26/2021   Procedure: COLONOSCOPY WITH PROPOFOL;  Surgeon: Vida Rigger, MD;  Location: [facility];  Service: Endoscopy;  Laterality: N/A;   CORONARY ANGIOPLASTY WITH STENT PLACEMENT  04/28/2003   L Cfx with 99% prox lesion in AV groove; patent SVG to ramus intermedius w/ 95% distal lesion; SVG to OM totally occluded; SVG to RCA totally occluded; native RCA occluded in prox region w/collaterals from LAD into PDA; Taxus 2.5x49mm stent to distal VG to ramus (Dr. Laurell Josephs)   CORONARY  ARTERY BYPASS GRAFT  01/1992   LIMA-LAD & diagonal, SVG to diagonal, SVG to OM1 & OM2, VG to PDA & PLA (Dr. Andrey Campanile)   ESOPHAGOGASTRODUODENOSCOPY (EGD) WITH PROPOFOL N/A 02/26/2021   Procedure: ESOPHAGOGASTRODUODENOSCOPY (EGD) WITH PROPOFOL;  Surgeon: Vida Rigger, MD;  [facility]: [facility];  Service: Endoscopy;  Laterality: N/A;   NM MYOCAR PERF WALL MOTION  11/2011   lexiscan - normal study, EF 80% post-stress   POLYPECTOMY  02/26/2021   Procedure: POLYPECTOMY;  Surgeon: Vida Rigger, MD;  Location: [facility];  Service: Endoscopy;;   RENAL DOPPLER  12/2011   Right CIA with aneursymal dilatation 2.3x3.1   TRANSTHORACIC ECHOCARDIOGRAM  10/2008   EF 50-55%, borderline conc LVH, mild posterior wall hypokinesis; mild mitral annular calcification, prolapse of anterior leaflets, mild MR; RVSP 30-53mmHg; mild TR; mild calcification of AV leaflets    Social History:  reports that he quit smoking about 17 years ago. His smoking use included cigarettes. He has been exposed to tobacco smoke. He has never used smokeless tobacco. He reports that he does not drink alcohol and does not use drugs. Family History: family history includes Cancer in his mother.   HOME MEDICATIONS: Allergies as of 05/06/2022       Reactions   Niacin Other (See Comments)   Turned red & his skin burned   Niaspan [niacin Er]    Turned red & his skin burned  Medication List        Accurate as of May 06, 2022  1:56 PM. If you have any questions, ask your nurse or doctor.          STOP taking these medications    guaiFENesin-dextromethorphan 100-10 MG/5ML syrup Commonly known as: ROBITUSSIN DM Stopped by: Scarlette Shorts, MD       TAKE these medications    amLODipine 10 MG tablet Commonly known as: NORVASC TAKE 1 TABLET BY MOUTH EVERY DAY   clopidogrel 75 MG tablet Commonly known as: PLAVIX Take 1 tablet (75 mg total) by mouth daily.   diphenhydramine-acetaminophen 25-500 MG Tabs  tablet Commonly known as: TYLENOL PM Take 1 tablet by mouth at bedtime.   ezetimibe 10 MG tablet Commonly known as: ZETIA Take 1 tablet (10 mg total) by mouth daily.   Fish Oil 1200 MG Caps Take 1 capsule (1,200 mg total) by mouth 2 (two) times daily. What changed: when to take this   Folbic 2.5-25-2 MG Tabs tablet Generic drug: folic acid-pyridoxine-cyancobalamin TAKE 1 TABLET BY MOUTH EVERY DAY   IRON PO Take 6.5 mg by mouth daily.   lisinopril 2.5 MG tablet Commonly known as: ZESTRIL TAKE 1 TABLET BY MOUTH EVERY DAY   magnesium hydroxide 400 MG/5ML suspension Commonly known as: MILK OF MAGNESIA Take 15 mLs by mouth 3 (three) times a week.   metoprolol succinate 25 MG 24 hr tablet Commonly known as: TOPROL-XL TAKE 1 TABLET (25 MG TOTAL) BY MOUTH DAILY.   pantoprazole 40 MG tablet Commonly known as: PROTONIX Take 1 tablet (40 mg total) by mouth daily.   rosuvastatin 40 MG tablet Commonly known as: CRESTOR TAKE 1 TABLET BY MOUTH EVERY DAY          REVIEW OF SYSTEMS: A comprehensive ROS was conducted with the patient and is negative except as per HPI    OBJECTIVE:  VS: BP 120/66 (BP Location: Left Arm, Patient Position: Sitting, Cuff Size: Small)   Pulse 75   Ht  (1.753 m)   Wt 173 lb 3.2 oz (78.6 kg)   SpO2 94%   BMI 25.58 kg/m    Wt Readings from Last 3 Encounters:  05/06/22 173 lb 3.2 oz (78.6 kg)  04/21/22 169 lb (76.7 kg)  03/26/22 174 lb (78.9 kg)     EXAM: General: Pt appears well and is in NAD  Eyes: External eye exam normal without stare, lid lag or exophthalmos.  EOM intact.  PERRL.  Neck: General: Supple without adenopathy. Thyroid: Thyroid size normal.  No goiter or nodules appreciated. No thyroid bruit.  Lungs: Clear with good BS bilat with no rales, rhonchi, or wheezes  Heart: Auscultation: RRR.  Abdomen: Normoactive bowel sounds, soft, nontender, without masses or organomegaly palpable  Extremities:  BL LE: No pretibial edema  normal ROM and strength.  Mental Status: Judgment, insight: Intact Orientation: Oriented to time, place, and person Mood and affect: No depression, anxiety, or agitation     DATA REVIEWED:  Latest Reference Range & Units 05/06/22 14:02  TSH 0.35 - 5.50 uIU/mL 4.68  T4,Free(Direct) 0.60 - 1.60 ng/dL 2.13      ASSESSMENT/PLAN/RECOMMENDATIONS:   SubClinical Hypothyroidism:  - Pt is clinically euthyroid  -His TFTs have trended down -We discussed the need to start levothyroxine if his TSH >10 u IU/mL, low free T4 with symptoms -No indication to treat at this time  Follow-up in 6 months  Signed electronically by: Lyndle Herrlich, MD  [place]  [facility]  [facility] [address] [place], [place] [address] Phone: [phone] FAX: [phone]   CC: Rema Fendt, NP [address] [place] [address] Phone: [phone] Fax: [phone]   Return to Endocrinology clinic as below: Future Appointments  Date Time Provider Department Center  05/06/2022  2:00 PM Kayleb Warshaw, Konrad Dolores, MD [facility] None  05/14/2022 10:30 AM [facility]  05/14/2022 11:30 AM [facility]

## 2022-05-14 ENCOUNTER — Ambulatory Visit (HOSPITAL_COMMUNITY)
Admission: RE | Admit: 2022-05-14 | Discharge: 2022-05-14 | Disposition: A | Discharge status: Home / Self Care | Payer: PPO | Source: Ambulatory Visit | Attending: Cardiovascular Disease | Admitting: Cardiovascular Disease

## 2022-05-14 ENCOUNTER — Ambulatory Visit (HOSPITAL_BASED_OUTPATIENT_CLINIC_OR_DEPARTMENT_OTHER)
Admission: RE | Admit: 2022-05-14 | Discharge: 2022-05-14 | Disposition: A | Discharge status: Home / Self Care | Payer: PPO | Source: Ambulatory Visit | Attending: Cardiovascular Disease | Admitting: Cardiovascular Disease

## 2022-05-14 DIAGNOSIS — I2510 Atherosclerotic heart disease of native coronary artery without angina pectoris: Secondary | ICD-10-CM | POA: Insufficient documentation

## 2022-05-14 DIAGNOSIS — I723 Aneurysm of iliac artery: Secondary | ICD-10-CM | POA: Diagnosis present

## 2022-05-14 DIAGNOSIS — I6523 Occlusion and stenosis of bilateral carotid arteries: Secondary | ICD-10-CM | POA: Diagnosis present

## 2022-07-02 ENCOUNTER — Other Ambulatory Visit: Payer: Self-pay | Admitting: Cardiovascular Disease

## 2022-07-02 NOTE — Telephone Encounter (Signed)
Pt is requesting a refill on Folbic 2.5-25-2 mg tablets. Would Dr. Tresa Endo like to refill this medication? Please address

## 2022-07-02 NOTE — Telephone Encounter (Signed)
*  STAT* If patient is at the pharmacy, call can be transferred to refill team.   1. Which medications need to be refilled? (please list name of each medication and dose if known)   FOLBIC 2.5-25-2 MG TABS tablet    2. Which pharmacy/location (including street and city if local pharmacy) is medication to be sent to?[id number] - [place], [place] - [address]   3. Do they need a 30 day or 90 day supply? 90 day

## 2022-08-14 ENCOUNTER — Other Ambulatory Visit: Payer: Self-pay | Admitting: Cardiovascular Disease

## 2022-08-26 ENCOUNTER — Other Ambulatory Visit (HOSPITAL_COMMUNITY): Payer: Self-pay | Admitting: Student

## 2022-08-26 DIAGNOSIS — I714 Abdominal aortic aneurysm, without rupture, unspecified: Secondary | ICD-10-CM

## 2022-08-26 DIAGNOSIS — I779 Disorder of arteries and arterioles, unspecified: Secondary | ICD-10-CM

## 2022-09-05 ENCOUNTER — Other Ambulatory Visit: Payer: Self-pay | Admitting: Cardiovascular Disease

## 2022-10-27 IMAGING — DX DG CHEST 2V
2 series · 2 of 2 positions shown · non-contrast
Comparison: 11/14/2020

CLINICAL DATA: Persistent cough for 3 weeks

EXAM:
CHEST - 2 VIEW

[chest pa]
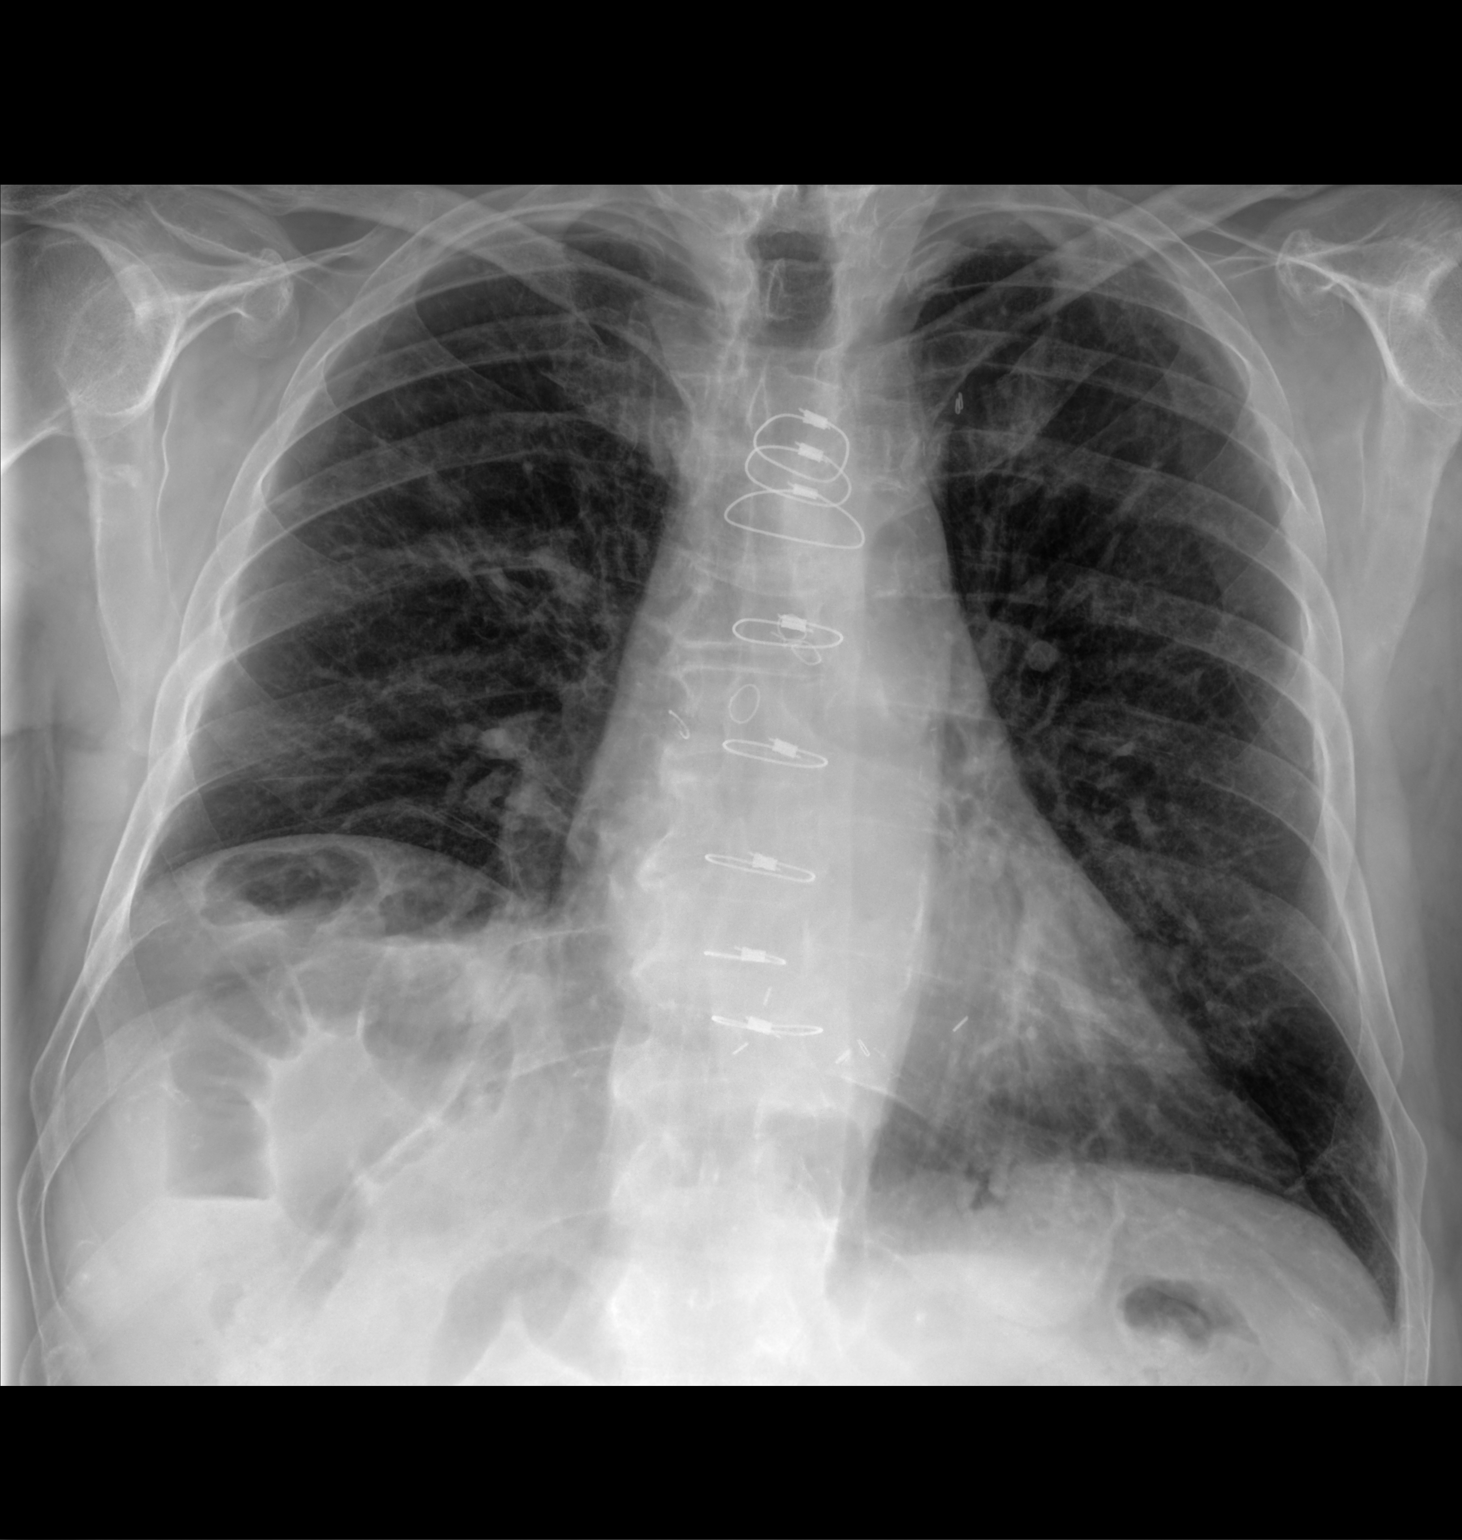

[chest lat]
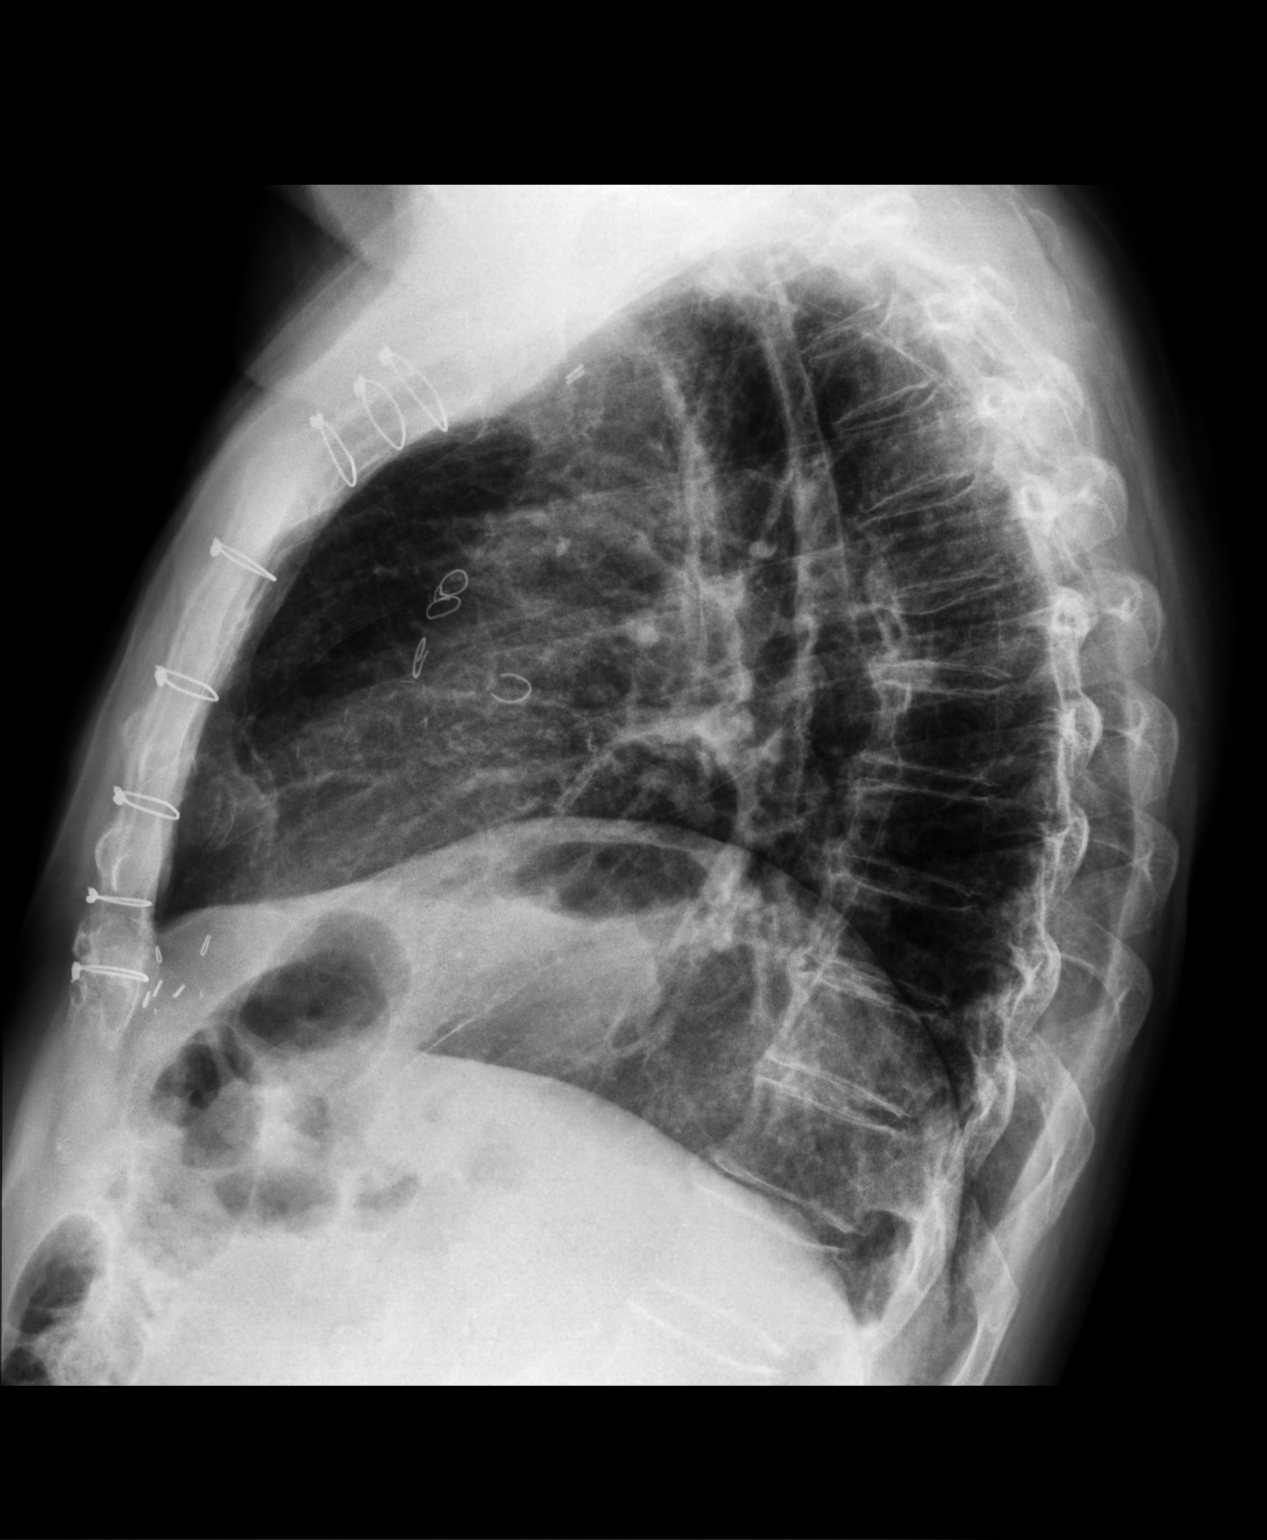

[2 of 2 positions shown; findings below may reference images not displayed]

FINDINGS: Cardiomegaly status post median sternotomy and CABG. Unchanged
elevation of the right hemidiaphragm. No acute appearing airspace
opacity. Disc degenerative disease of the thoracic spine.
IMPRESSION: No acute abnormality of the lungs. Unchanged elevation of the right
hemidiaphragm. No acute appearing airspace opacity.

## 2022-11-05 ENCOUNTER — Ambulatory Visit: Payer: PPO | Admitting: Internal Medicine

## 2022-12-11 ENCOUNTER — Other Ambulatory Visit: Payer: Self-pay | Admitting: Cardiovascular Disease

## 2023-03-12 ENCOUNTER — Ambulatory Visit (INDEPENDENT_AMBULATORY_CARE_PROVIDER_SITE_OTHER): Payer: PPO

## 2023-03-12 DIAGNOSIS — Z0000 Encounter for general adult medical examination without abnormal findings: Secondary | ICD-10-CM

## 2023-03-12 NOTE — Progress Notes (Signed)
 Subjective:   Frederick Kirby is a 88 y.o. who presents for a Medicare Wellness preventive visit.  Visit Complete: Virtual I connected with  Reginold Agent on 03/12/23 by a audio enabled telemedicine application and verified that I am speaking with the correct person using two identifiers.  Patient Location: Home  Provider Location: Home Office  I discussed the limitations of evaluation and management by telemedicine. The patient expressed understanding and agreed to proceed.  Vital Signs: Because this visit was a virtual/telehealth visit, some criteria may be missing or patient reported. Any vitals not documented were not able to be obtained and vitals that have been documented are patient reported.  VideoError- Librarian, academic were attempted between this provider and patient, however failed, due to patient having technical difficulties OR patient did not have access to video capability.  We continued and completed visit with audio only.   AWV Questionnaire: No: Patient Medicare AWV questionnaire was not completed prior to this visit.  Cardiac Risk Factors include: advanced age (>13men, >69 women);dyslipidemia;hypertension;male gender     Objective:    Today's Vitals   There is no height or weight on file to calculate BMI.     03/12/2023    2:23 PM 02/26/2021   10:59 AM 11/12/2020    1:56 PM 12/04/2013    3:25 AM  Advanced Directives  Does Patient Have a Medical Advance Directive? No No No Yes  Type of Diplomatic Services operational officer of Healthcare Power of Attorney in Chart?    No - copy requested  Would patient like information on creating a medical advance directive? No - Patient declined No - Patient declined No - Patient declined     Current Medications (verified) Outpatient Encounter Medications as of 03/12/2023  Medication Sig   amLODipine (NORVASC) 10 MG tablet TAKE 1 TABLET BY MOUTH EVERY DAY   clopidogrel  (PLAVIX) 75 MG tablet TAKE 1 TABLET BY MOUTH EVERY DAY   diphenhydramine-acetaminophen (TYLENOL PM) 25-500 MG TABS tablet Take 1 tablet by mouth at bedtime.   ezetimibe (ZETIA) 10 MG tablet Take 1 tablet (10 mg total) by mouth daily.   Ferrous Sulfate (IRON PO) Take 6.5 mg by mouth daily.   lisinopril (ZESTRIL) 2.5 MG tablet TAKE 1 TABLET BY MOUTH EVERY DAY   magnesium hydroxide (MILK OF MAGNESIA) 400 MG/5ML suspension Take 15 mLs by mouth 3 (three) times a week.   metoprolol succinate (TOPROL-XL) 25 MG 24 hr tablet TAKE 1 TABLET (25 MG TOTAL) BY MOUTH DAILY.   Omega-3 Fatty Acids (FISH OIL) 1200 MG CAPS Take 1 capsule (1,200 mg total) by mouth 2 (two) times daily. (Patient taking differently: Take 1 capsule by mouth daily.)   pantoprazole (PROTONIX) 40 MG tablet Take 1 tablet (40 mg total) by mouth daily.   rosuvastatin (CRESTOR) 40 MG tablet TAKE 1 TABLET BY MOUTH EVERY DAY   FOLBIC 2.5-25-2 MG TABS tablet TAKE 1 TABLET BY MOUTH EVERY DAY (Patient not taking: Reported on 03/12/2023)   No facility-administered encounter medications on file as of 03/12/2023.    Allergies (verified) Niacin and Niaspan [niacin er (antihyperlipidemic)]   History: Past Medical History:  Diagnosis Date   COPD (chronic obstructive pulmonary disease) (HCC)    Coronary artery disease    Hyperlipidemia    Hypertension    Mild renal insufficiency    Myocardial infarction (HCC)    S/P CABG (coronary artery bypass graft) 1994  Stroke [facility])    Past Surgical History:  Procedure Laterality Date   BIOPSY  02/26/2021   Procedure: BIOPSY;  Surgeon: Vida Rigger, MD;  [facility];  Service: Endoscopy;;   CAROTID DOPPLER  12/2011   Right Bulb/Prox ICA 50-69% diameter reduction; Left Bulb/Prox ICA 0-49% diameter reduction   COLONOSCOPY WITH PROPOFOL N/A 02/26/2021   Procedure: COLONOSCOPY WITH PROPOFOL;  Surgeon: Vida Rigger, MD;  [facility]: [facility];  Service: Endoscopy;  Laterality: N/A;   CORONARY  ANGIOPLASTY WITH STENT PLACEMENT  04/28/2003   L Cfx with 99% prox lesion in AV groove; patent SVG to ramus intermedius w/ 95% distal lesion; SVG to OM totally occluded; SVG to RCA totally occluded; native RCA occluded in prox region w/collaterals from LAD into PDA; Taxus 2.5x51mm stent to distal VG to ramus (Dr. Laurell Josephs)   CORONARY ARTERY BYPASS GRAFT  01/1992   LIMA-LAD & diagonal, SVG to diagonal, SVG to OM1 & OM2, VG to PDA & PLA (Dr. Andrey Campanile)   ESOPHAGOGASTRODUODENOSCOPY (EGD) WITH PROPOFOL N/A 02/26/2021   Procedure: ESOPHAGOGASTRODUODENOSCOPY (EGD) WITH PROPOFOL;  Surgeon: Vida Rigger, MD;  [facility];  Service: Endoscopy;  Laterality: N/A;   NM MYOCAR PERF WALL MOTION  11/2011   lexiscan - normal study, EF 80% post-stress   POLYPECTOMY  02/26/2021   Procedure: POLYPECTOMY;  Surgeon: Vida Rigger, MD;  [facility];  Service: Endoscopy;;   RENAL DOPPLER  12/2011   Right CIA with aneursymal dilatation 2.3x3.1   TRANSTHORACIC ECHOCARDIOGRAM  10/2008   EF 50-55%, borderline conc LVH, mild posterior wall hypokinesis; mild mitral annular calcification, prolapse of anterior leaflets, mild MR; RVSP 30-35mmHg; mild TR; mild calcification of AV leaflets   Family History  Problem Relation Age of Onset   Cancer Mother    Social History   Socioeconomic History   Marital status: Married    Spouse name: Not on file   Number of children: Not on file   Years of education: Not on file   Highest education level: Not on file  Occupational History   Not on file  Tobacco Use   Smoking status: Former    Current packs/day: 0.00    Types: Cigarettes    Quit date: 01/13/2005    Years since quitting: 18.1    Passive exposure: Past   Smokeless tobacco: Never  Vaping Use   Vaping status: Never Used  Substance and Sexual Activity   Alcohol use: No   Drug use: No   Sexual activity: Not Currently  Other Topics Concern   Not on file  Social History Narrative   Not on file    Social Drivers of Health   Financial Resource Strain: Low Risk  (03/12/2023)   Overall Financial Resource Strain (CARDIA)    Difficulty of Paying Living Expenses: Not hard at all  Food Insecurity: No Food Insecurity (03/12/2023)   Hunger Vital Sign    Worried About Running Out of Food in the Last Year: Never true    Ran Out of Food in the Last Year: Never true  Transportation Needs: No Transportation Needs (03/12/2023)   PRAPARE - Administrator, Civil Service (Medical): No    Lack of Transportation (Non-Medical): No  Physical Activity: Inactive (03/12/2023)   Exercise Vital Sign    Days of Exercise per Week: 0 days    Minutes of Exercise per Session: 0 min  Stress: No Stress Concern Present (03/12/2023)   [employer] of Occupational Health - Occupational Stress Questionnaire  Feeling of Stress : Not at all  Social Connections: Socially Isolated (03/12/2023)   Social Connection and Isolation Panel [NHANES]    Frequency of Communication with Friends and Family: Never    Frequency of Social Gatherings with Friends and Family: Never    Attends Religious Services: Never    Diplomatic Services operational officer: No    Attends Engineer, structural: Never    Marital Status: Married    Tobacco Counseling Counseling given: Not Answered    Clinical Intake:  Pre-visit preparation completed: Yes  Pain : No/denies pain     Nutritional Risks: None Diabetes: No  How often do you need to have someone help you when you read instructions, pamphlets, or other written materials from your doctor or pharmacy?: 1 - Never  Interpreter Needed?: No  Information entered by :: NAllen LPN   Activities of Daily Living     03/12/2023    2:17 PM 03/27/2022    3:07 PM  In your present state of health, do you have any difficulty performing the following activities:  Hearing? 1 1  Comment slight hearing issues   Vision? 0 0  Difficulty concentrating or making  decisions? 0 0  Walking or climbing stairs? 0 0  Dressing or bathing? 0 0  Doing errands, shopping? 0 0  Preparing Food and eating ? N N  Using the Toilet? N N  In the past six months, have you accidently leaked urine? N N  Do you have problems with loss of bowel control? N N  Managing your Medications? N N  Managing your Finances? N N  Housekeeping or managing your Housekeeping? N N    Patient Care Team: Rema Fendt, NP as PCP - General (Nurse Practitioner) Lennette Bihari, MD as PCP - Cardiology (Cardiology)  Indicate any recent Medical Services you may have received from other than [facility] providers in the past year (date may be approximate).     Assessment:   This is a routine wellness examination for [place].  Hearing/Vision screen Hearing Screening - Comments:: Slight hearing issue no hearing aid needed Vision Screening - Comments:: No regular eye exams   Goals Addressed             This Visit's Progress    Patient Stated       03/12/2023, stay healthy       Depression Screen     03/12/2023    2:24 PM 03/26/2022    1:28 PM 01/28/2021    1:58 PM  PHQ 2/9 Scores  PHQ - 2 Score 0 0 0  PHQ- 9 Score 0      Fall Risk     03/12/2023    2:24 PM 03/27/2022    3:06 PM  Fall Risk   Falls in the past year? 0 1  Number falls in past yr: 0 1  Injury with Fall? 0 1  Risk for fall due to : Medication side effect History of fall(s)  Follow up Falls prevention discussed;Falls evaluation completed Education provided;Falls prevention discussed    MEDICARE RISK AT HOME:  Medicare Risk at Home Any stairs in or around the home?: No If so, are there any without handrails?: No Home free of loose throw rugs in walkways, pet beds, electrical cords, etc?: Yes Adequate lighting in your home to reduce risk of falls?: Yes Life alert?: No Use of a cane, walker or w/c?: No Grab bars in the bathroom?: Yes Shower chair or bench  in shower?: No Elevated toilet seat or a handicapped  toilet?: No  TIMED UP AND GO:  Was the test performed?  No  Cognitive Function: 6CIT completed        03/12/2023    2:25 PM 03/27/2022    3:07 PM  6CIT Screen  What Year? 0 points 0 points  What month? 0 points 0 points  What time? 0 points 0 points  Count back from 20 0 points 0 points  Months in reverse 4 points 0 points  Repeat phrase 0 points 0 points  Total Score 4 points 0 points    Immunizations Immunization History  Administered Date(s) Administered   Fluad Quad(high Dose 65+) 09/22/2018, 12/30/2021   Influenza, High Dose Seasonal PF 09/26/2017, 11/05/2020   PFIZER(Purple Top)SARS-COV-2 Vaccination 05/10/2019, 05/31/2019   Pfizer Covid-19 Vaccine Bivalent Booster 45yrs & up 12/14/2019   Pfizer(Comirnaty)Fall Seasonal Vaccine 12 years and older 10/01/2022   Tdap 03/05/2022    Screening Tests Health Maintenance  Topic Date Due   Pneumonia Vaccine 45+ Years old (1 of 2 - PCV) Never done   Zoster Vaccines- Shingrix (1 of 2) Never done   COVID-19 Vaccine (5 - 2024-25 season) 11/26/2022   Medicare Annual Wellness (AWV)  03/11/2024   DTaP/Tdap/Td (2 - Td or Tdap) 03/05/2032   INFLUENZA VACCINE  Completed   HPV VACCINES  Aged Out    Health Maintenance  Health Maintenance Due  Topic Date Due   Pneumonia Vaccine 48+ Years old (1 of 2 - PCV) Never done   Zoster Vaccines- Shingrix (1 of 2) Never done   COVID-19 Vaccine (5 - 2024-25 season) 11/26/2022   Health Maintenance Items Addressed: Will get pneumonia vaccine. Declines shingles vaccine  Additional Screening:  Vision Screening: Recommended annual ophthalmology exams for early detection of glaucoma and other disorders of the eye.  Dental Screening: Recommended annual dental exams for proper oral hygiene  Community Resource Referral / Chronic Care Management: CRR required this visit?  No   CCM required this visit?  No     Plan:     I have personally reviewed and noted the following in the patient's  chart:   Medical and social history Use of alcohol, tobacco or illicit drugs  Current medications and supplements including opioid prescriptions. Patient is not currently taking opioid prescriptions. Functional ability and status Nutritional status Physical activity Advanced directives List of other physicians Hospitalizations, surgeries, and ER visits in previous 12 months Vitals Screenings to include cognitive, depression, and falls Referrals and appointments  In addition, I have reviewed and discussed with patient certain preventive protocols, quality metrics, and best practice recommendations. A written personalized care plan for preventive services as well as general preventive health recommendations were provided to patient.     Barb Merino, LPN   1/61/0960   After Visit Summary: (MyChart) Due to this being a telephonic visit, the after visit summary with patients personalized plan was offered to patient via MyChart   Notes: Nothing significant to report at this time.

## 2023-03-12 NOTE — Patient Instructions (Signed)
 Frederick Kirby , Thank you for taking time to come for your Medicare Wellness Visit. I appreciate your ongoing commitment to your health goals. Please review the following plan we discussed and let me know if I can assist you in the future.   Referrals/Orders/Follow-Ups/Clinician Recommendations: none  This is a list of the screening recommended for you and due dates:  Health Maintenance  Topic Date Due   Pneumonia Vaccine (1 of 2 - PCV) Never done   Zoster (Shingles) Vaccine (1 of 2) Never done   COVID-19 Vaccine (5 - 2024-25 season) 11/26/2022   Medicare Annual Wellness Visit  03/11/2024   DTaP/Tdap/Td vaccine (2 - Td or Tdap) 03/05/2032   Flu Shot  Completed   HPV Vaccine  Aged Out    Advanced directives: (Copy Requested) Please bring a copy of your health care power of attorney and living will to the office to be added to your chart at your convenience.  Next Medicare Annual Wellness Visit scheduled for next year: Yes  insert Preventive Care attachment Insert FALL PREVENTION attachment if needed

## 2023-04-07 ENCOUNTER — Ambulatory Visit (INDEPENDENT_AMBULATORY_CARE_PROVIDER_SITE_OTHER): Payer: PPO | Admitting: Family

## 2023-04-07 ENCOUNTER — Encounter: Payer: Self-pay | Admitting: Family

## 2023-04-07 VITALS — BP 136/76 | HR 72 | Temp 97.9°F | Ht 69.0 in | Wt 180.2 lb

## 2023-04-07 DIAGNOSIS — K219 Gastro-esophageal reflux disease without esophagitis: Secondary | ICD-10-CM

## 2023-04-07 MED ORDER — PANTOPRAZOLE SODIUM 40 MG PO TBEC: 40.0000 mg | DELAYED_RELEASE_TABLET | Freq: Every day | ORAL | 0 refills | Status: DC

## 2023-04-07 NOTE — Progress Notes (Signed)
 Patient ID: Frederick Kirby, male    DOB: 1935-10-12  MRN: [id number]  CC: Chronic Conditions Follow-Up  Subjective: Frederick Kirby is a 88 y.o. male who presents for chronic conditions follow-up. He is accompanied by his wife.   His concerns today include:  - Doing well on Pantoprazole, no issues/concerns.  - Established with Cardiology.  - Established with Endocrinology.  - Established with Nephrology.  - Established with Dermatology.   Patient Active Problem List   Diagnosis Date Noted   COVID-19 virus infection 03/05/2022   Orthostatic hypotension 03/05/2022   Nasal septal deviation 09/09/2021   Sensorineural hearing loss (SNHL) of both ears 09/09/2021   Diverticular disease of colon 07/22/2021   Diverticulosis of small intestine 07/22/2021   Anemia due to chronic blood loss 01/28/2021   Blood in feces 01/28/2021   Cholelithiasis 01/28/2021   Chronic obstructive pulmonary disease, unspecified (HCC) 01/28/2021   Gastrointestinal hemorrhage 01/28/2021   Helicobacter pylori gastrointestinal tract infection 01/28/2021   History of duodenal ulcer 01/28/2021   History of Helicobacter pylori infection 01/28/2021   History of colonic polyps 01/28/2021   Right upper quadrant pain 01/28/2021   Coronary artery disease 01/28/2021   Other specified abnormal findings of blood chemistry 01/28/2021   Transaminitis 01/28/2021   Voice hoarseness 01/28/2021   Symptomatic anemia 11/10/2020   ARF (acute renal failure) (HCC) 11/10/2020   Elevated LFTs 11/10/2020   Hoarseness 11/10/2020   Carotid bruit 10/17/2014   CAD (coronary artery disease) 12/16/2012   CAD (coronary artery disease) of artery bypass graft 12/16/2012   HTN (hypertension) 12/16/2012   CKD (chronic kidney disease) stage 4, GFR 15-29 ml/min (HCC) 12/16/2012   Hyperlipidemia with target LDL less than 70 12/16/2012   Iliac artery aneurysm (HCC) 12/16/2012     Current Outpatient Medications on File Prior to Visit   Medication Sig Dispense Refill   amLODipine (NORVASC) 10 MG tablet TAKE 1 TABLET BY MOUTH EVERY DAY 90 tablet 2   clopidogrel (PLAVIX) 75 MG tablet TAKE 1 TABLET BY MOUTH EVERY DAY 90 tablet 1   diphenhydramine-acetaminophen (TYLENOL PM) 25-500 MG TABS tablet Take 1 tablet by mouth at bedtime.     ezetimibe (ZETIA) 10 MG tablet Take 1 tablet (10 mg total) by mouth daily. 90 tablet 3   Ferrous Sulfate (IRON PO) Take 6.5 mg by mouth daily.     lisinopril (ZESTRIL) 2.5 MG tablet TAKE 1 TABLET BY MOUTH EVERY DAY 90 tablet 2   magnesium hydroxide (MILK OF MAGNESIA) 400 MG/5ML suspension Take 15 mLs by mouth 3 (three) times a week.     metoprolol succinate (TOPROL-XL) 25 MG 24 hr tablet TAKE 1 TABLET (25 MG TOTAL) BY MOUTH DAILY. 90 tablet 3   Omega-3 Fatty Acids (FISH OIL) 1200 MG CAPS Take 1 capsule (1,200 mg total) by mouth 2 (two) times daily. (Patient taking differently: Take 1 capsule by mouth daily.) 180 capsule 3   rosuvastatin (CRESTOR) 40 MG tablet TAKE 1 TABLET BY MOUTH EVERY DAY 90 tablet 2   FOLBIC 2.5-25-2 MG TABS tablet TAKE 1 TABLET BY MOUTH EVERY DAY (Patient not taking: Reported on 03/12/2023) 90 tablet 3   No current facility-administered medications on file prior to visit.    Allergies  Allergen Reactions   Niacin Other (See Comments)    Turned red & his skin burned   Niaspan [Niacin Er (Antihyperlipidemic)]     Turned red & his skin burned    Social History   Socioeconomic History  Marital status: Married    Spouse name: Not on file   Number of children: Not on file   Years of education: Not on file   Highest education level: Not on file  Occupational History   Not on file  Tobacco Use   Smoking status: Former    Current packs/day: 0.00    Types: Cigarettes    Quit date: 01/13/2005    Years since quitting: 18.2    Passive exposure: Past   Smokeless tobacco: Never  Vaping Use   Vaping status: Never Used  Substance and Sexual Activity   Alcohol use: No    Drug use: No   Sexual activity: Not Currently  Other Topics Concern   Not on file  Social History Narrative   Not on file   Social Drivers of Health   Financial Resource Strain: Low Risk  (03/12/2023)   Overall Financial Resource Strain (CARDIA)    Difficulty of Paying Living Expenses: Not hard at all  Food Insecurity: No Food Insecurity (03/12/2023)   Hunger Vital Sign    Worried About Running Out of Food in the Last Year: Never true    Ran Out of Food in the Last Year: Never true  Transportation Needs: No Transportation Needs (03/12/2023)   PRAPARE - Administrator, Civil Service (Medical): No    Lack of Transportation (Non-Medical): No  Physical Activity: Inactive (03/12/2023)   Exercise Vital Sign    Days of Exercise per Week: 0 days    Minutes of Exercise per Session: 0 min  Stress: No Stress Concern Present (03/12/2023)   [employer] of Occupational Health - Occupational Stress Questionnaire    Feeling of Stress : Not at all  Social Connections: Socially Isolated (03/12/2023)   Social Connection and Isolation Panel [NHANES]    Frequency of Communication with Friends and Family: Never    Frequency of Social Gatherings with Friends and Family: Never    Attends Religious Services: Never    Database administrator or Organizations: No    Attends Banker Meetings: Never    Marital Status: Married  Catering manager Violence: Not At Risk (03/12/2023)   Humiliation, Afraid, Rape, and Kick questionnaire    Fear of Current or Ex-Partner: No    Emotionally Abused: No    Physically Abused: No    Sexually Abused: No    Family History  Problem Relation Age of Onset   Cancer Mother     Past Surgical History:  Procedure Laterality Date   BIOPSY  02/26/2021   Procedure: BIOPSY;  Surgeon: Vida Rigger, MD;  Location: [facility];  Service: Endoscopy;;   CAROTID DOPPLER  12/2011   Right Bulb/Prox ICA 50-69% diameter reduction; Left Bulb/Prox ICA 0-49%  diameter reduction   COLONOSCOPY WITH PROPOFOL N/A 02/26/2021   Procedure: COLONOSCOPY WITH PROPOFOL;  Surgeon: Vida Rigger, MD;  Location: [facility];  Service: Endoscopy;  Laterality: N/A;   CORONARY ANGIOPLASTY WITH STENT PLACEMENT  04/28/2003   L Cfx with 99% prox lesion in AV groove; patent SVG to ramus intermedius w/ 95% distal lesion; SVG to OM totally occluded; SVG to RCA totally occluded; native RCA occluded in prox region w/collaterals from LAD into PDA; Taxus 2.5x61mm stent to distal VG to ramus (Dr. Laurell Josephs)   CORONARY ARTERY BYPASS GRAFT  01/1992   LIMA-LAD & diagonal, SVG to diagonal, SVG to OM1 & OM2, VG to PDA & PLA (Dr. Andrey Campanile)   ESOPHAGOGASTRODUODENOSCOPY (EGD) WITH PROPOFOL N/A 02/26/2021  Procedure: ESOPHAGOGASTRODUODENOSCOPY (EGD) WITH PROPOFOL;  Surgeon: Vida Rigger, MD;  Location: [facility];  Service: Endoscopy;  Laterality: N/A;   NM MYOCAR PERF WALL MOTION  11/2011   lexiscan - normal study, EF 80% post-stress   POLYPECTOMY  02/26/2021   Procedure: POLYPECTOMY;  Surgeon: Vida Rigger, MD;  Location: [facility];  Service: Endoscopy;;   RENAL DOPPLER  12/2011   Right CIA with aneursymal dilatation 2.3x3.1   TRANSTHORACIC ECHOCARDIOGRAM  10/2008   EF 50-55%, borderline conc LVH, mild posterior wall hypokinesis; mild mitral annular calcification, prolapse of anterior leaflets, mild MR; RVSP 30-10mmHg; mild TR; mild calcification of AV leaflets    ROS: Review of Systems Negative except as stated above  PHYSICAL EXAM: BP 136/76   Pulse 72   Temp 97.9 F (36.6 C) (Oral)   Ht 5\' 9"  (1.753 m)   Wt 180 lb 3.2 oz (81.7 kg)   SpO2 95%   BMI 26.61 kg/m   Physical Exam HENT:     Head: Normocephalic and atraumatic.     Nose: Nose normal.     Mouth/Throat:     Mouth: Mucous membranes are moist.     Pharynx: Oropharynx is clear.  Eyes:     Extraocular Movements: Extraocular movements intact.     Conjunctiva/sclera: Conjunctivae normal.     Pupils: Pupils are  equal, round, and reactive to light.  Cardiovascular:     Rate and Rhythm: Normal rate and regular rhythm.     Pulses: Normal pulses.     Heart sounds: Normal heart sounds.  Pulmonary:     Effort: Pulmonary effort is normal.     Breath sounds: Normal breath sounds.  Musculoskeletal:        General: Normal range of motion.     Cervical back: Normal range of motion and neck supple.  Neurological:     General: No focal deficit present.     Mental Status: He is alert and oriented to person, place, and time.  Psychiatric:        Mood and Affect: Mood normal.        Behavior: Behavior normal.     ASSESSMENT AND PLAN: 1. Gastroesophageal reflux disease, unspecified whether esophagitis present (Primary) - Continue Pantoprazole as prescribed. Counseled on medication adherence/adverse effects.  - Follow-up with primary provider in 3 months or sooner if needed. - pantoprazole (PROTONIX) 40 MG tablet; Take 1 tablet (40 mg total) by mouth daily.  Dispense: 90 tablet; Refill: 0   Patient was given the opportunity to ask questions.  Patient verbalized understanding of the plan and was able to repeat key elements of the plan. Patient was given clear instructions to go to Emergency Department or return to medical center if symptoms don't improve, worsen, or new problems develop.The patient verbalized understanding.    Requested Prescriptions   Signed Prescriptions Disp Refills   pantoprazole (PROTONIX) 40 MG tablet 90 tablet 0    Sig: Take 1 tablet (40 mg total) by mouth daily.    Follow-up with primary provider as scheduled.  Rema Fendt, NP

## 2023-04-07 NOTE — Progress Notes (Signed)
 Patient states nothing to discuss.

## 2023-04-11 ENCOUNTER — Other Ambulatory Visit: Payer: Self-pay | Admitting: Student

## 2023-05-25 ENCOUNTER — Other Ambulatory Visit: Payer: Self-pay | Admitting: Cardiovascular Disease

## 2023-05-28 ENCOUNTER — Ambulatory Visit: Payer: PPO | Attending: Cardiovascular Disease | Admitting: Cardiovascular Disease

## 2023-05-28 ENCOUNTER — Encounter: Payer: Self-pay | Admitting: Cardiovascular Disease

## 2023-05-28 VITALS — BP 126/84 | HR 57 | Ht 69.0 in | Wt 182.2 lb

## 2023-05-28 DIAGNOSIS — I10 Essential (primary) hypertension: Secondary | ICD-10-CM

## 2023-05-28 DIAGNOSIS — I2510 Atherosclerotic heart disease of native coronary artery without angina pectoris: Secondary | ICD-10-CM | POA: Diagnosis not present

## 2023-05-28 DIAGNOSIS — Z951 Presence of aortocoronary bypass graft: Secondary | ICD-10-CM | POA: Diagnosis not present

## 2023-05-28 DIAGNOSIS — N1831 Chronic kidney disease, stage 3a: Secondary | ICD-10-CM

## 2023-05-28 DIAGNOSIS — E785 Hyperlipidemia, unspecified: Secondary | ICD-10-CM

## 2023-05-28 DIAGNOSIS — I6523 Occlusion and stenosis of bilateral carotid arteries: Secondary | ICD-10-CM

## 2023-05-28 DIAGNOSIS — I723 Aneurysm of iliac artery: Secondary | ICD-10-CM

## 2023-05-28 DIAGNOSIS — I350 Nonrheumatic aortic (valve) stenosis: Secondary | ICD-10-CM

## 2023-05-28 NOTE — Progress Notes (Signed)
 21 04/02/2022 patient ID: Frederick Kirby, male   DOB: [birth year], 88 y.o.   MRN: [id number]       HPI: Frederick Kirby is a 88 y.o. male who presents for a 29 month cardiology evaluation.  Frederick Kirby has a history of coronary as well as peripheral vascular disease. In 1994 he underwent CABG revascularization surgery( LIMA to the LAD, sequential vein to the OM1 and OM2, sequential vein to the PDA and PLA vessel). In April 2005, a 2.5x24 mm Taxus stent was inserted into the vein graft supplying the ramus intermediate vessel. At that time, his RCA graft was occluded and he also had an occluded graft to the marginal vessel but had a patent LIMA to the LAD.   Additional problems include mixed hyperlipidemia, documented iliac artery aneurysm, mild renal insufficiency, hypertension, as well as hyperlipidemia. He's also had instances with stage II hypertension requiring medication adjustment. He has a history of renal insufficiency  which did improve with reduction of his lisinopril  from 20 mg to 10 mg and further titration his metoprolol  extended release 75 mg.    He has a history of hyperlipidemia on Crestor  40 mg daily , and lipid studies in March 2015 revealed cholesterol 131, triglycerides 153, HDL 47, LDL 63.  In March 2015 abdominal aortic ultrasound demonstrated  mild amount of nonhemodynamically significant plaque.  There also is mild aneurysmal dilatation of his right common iliac artery , which measured 2.0 x 2.2 cm, unchanged from two years previously.  He has carotid disease with a carotid duplex study in January 2017 demonstrating a 60-79% right internal carotid stenosis and 40-59% left internal carotid artery stenosis.   He has a history of chronic kidney disease, stage III.  His last serum creatinine was 1.71  He underwent a follow-up carotid study on February 2018 which actually was improved from previously and showed 40-59% right internal carotid stenosis in the left internal carotid stenosis  is in the 1-39% range.  He has remained active.  When I saw him one year ago, he denied but has tolerated any episodes of chest pain, PND or orthopnea. In the past he had been started on irbesartan , but apparently did not tolerate this well since When I last saw him one year ago, he was on over the past year, he has remained lisinopril  at 2.5 mg mg daily in addition to amlodipine  10 mg and Toprol -XL 75 mg   I saw him in March 2019.  At that time he denied any chest pain and continued to be active.  He denied PND orthopnea.   He underwent a one-year follow-up carotid duplex scan on February 20, 2017 which again remains stable with 40-59% right ICA narrowing and 1 at 39% left carotid narrowing.  There is 50% stenoses bilaterally in the external carotids.  Vertebral arteries were patent with antegrade flow.  He had normal subclavian flow. He has not had recent lab work.  He continues to be on rosuvastatin  40 mg and fish oil  for mixed hyperlipidemia.  He was on omeprazole  for GERD and continue to be on clopidogrel .  He did not take aspirin due to history of ulcer disease.  Since I saw him, he was evaluated in a telemedicine visit in October 2020 by Ervin Heath, PA and was remaining stable.  He underwent follow-up carotid and abdominal aortic aneurysm Doppler studies in March 2021.  He again was found to have mild to moderate disease in the right carotid in the 40 to  59% range and left carotid in the 1 to 39% range.  He had bilateral antegrade flow.  There was disturbed velocity in the left subclavian artery.  There was no evidence for any abdominal aortic aneurysm.  His documented iliac artery aneurysm was stable in size at 2.1 cm.  I  saw him on March 05, 2020 after not having seen him in 34 months.  At that time he felt well and denied any recurrent chest pain, PND orthopnea.  He had not had recent laboratory.  He continued  to be on Plavix  but is not on aspirin due to a prior bleeding ulcer.  He continues to  be on amlodipine  10 mg, lisinopril  2.5 mg and metoprolol  succinate 25 mg for hypertension.  He is tolerating rosuvastatin  40 mg and over-the-counter fish oil  for mixed hyperlipidemia.  GERD is controlled with pantoprazole .   He was in the hospital from October 29 through November 14, 2020 when he presented with cough, shortness of breath, hoarseness of his voice for several days and had had URI symptoms for 7 days.  During his hospitalization he was found to have AKA and transaminitis.  Tells me he received 2 units of blood transfusion.  Right upper quadrant ultrasound showed cholelithiasis and sludge within the gallbladder with wall thickening.  CT of his abdomen and pelvis did not show any hepatic lesion or biliary ductal dilatation.  A HIDA scan was negative for cystic duct obstruction.  An echo Doppler study on November 12, 2020 showed an EF of 60 to 65% and mild LVH.  There was mild to moderate aortic valve stenosis with a mean gradient of 16 and peak gradient of 26 mm.  Estimated AVA was 1.16 cm.  I last saw him on Jun 06, 2021.  At that time he felt well and denied any chest pain, shortness of breath, presyncope or syncope.  He was unaware of palpitations.  He denied any abdominal pain.  He had undergone carotid ultrasound on March 29, 2021 which showed bilateral ICA stenosis in the 40 to 59% range, unchanged from March 2022.  He also underwent abdominal aortic ultrasound at that time which did not show evidence for AAA and there was mild dilation of right common iliac artery at 2.1 cm.  He had widely patent bilateral common and external iliac arteries.  He had not had recent laboratory.  He was last evaluated by Morey Ar. NP on April 21, 2022.  He continues to be asymptomatic.  Subsequent lipid study revealed total cholesterol 161, triglycerides 171, HDL 46, LDL 92.  He was on rosuvastatin  and Zetia  10 mg was added.  Presently, [name] feels well.  He denies chest pain or shortness of  breath.  He denies palpitations.  He continues to be on amlodipine  10 mg, lisinopril  2.5 mg and metoprolol  succinate 25 mg daily for blood pressure.  He is on clopidogrel  75 mg daily.  He takes Zetia  10 mg and rosuvastatin  40 mg for hyperlipidemia in addition to over-the-counter fish oil  and is on pantoprazole  for GERD.  He presents for evaluation.   Past Medical History:  Diagnosis Date   COPD (chronic obstructive pulmonary disease) (HCC)    Coronary artery disease    Hyperlipidemia    Hypertension    Mild renal insufficiency    Myocardial infarction (HCC)    S/P CABG (coronary artery bypass graft) 1994   Stroke [facility])     Past Surgical History:  Procedure Laterality Date   BIOPSY  02/26/2021   Procedure: BIOPSY;  Surgeon: Ozell Blunt, MD;  Location: [facility];  Service: Endoscopy;;   CAROTID DOPPLER  12/2011   Right Bulb/Prox ICA 50-69% diameter reduction; Left Bulb/Prox ICA 0-49% diameter reduction   COLONOSCOPY WITH PROPOFOL  N/A 02/26/2021   Procedure: COLONOSCOPY WITH PROPOFOL ;  Surgeon: Ozell Blunt, MD;  Location: [facility];  Service: Endoscopy;  Laterality: N/A;   CORONARY ANGIOPLASTY WITH STENT PLACEMENT  04/28/2003   L Cfx with 99% prox lesion in AV groove; patent SVG to ramus intermedius w/ 95% distal lesion; SVG to OM totally occluded; SVG to RCA totally occluded; native RCA occluded in prox region w/collaterals from LAD into PDA; Taxus 2.5x45mm stent to distal VG to ramus (Dr. Dina Francisco)   CORONARY ARTERY BYPASS GRAFT  01/1992   LIMA-LAD & diagonal, SVG to diagonal, SVG to OM1 & OM2, VG to PDA & PLA (Dr. Elvan Hamel)   ESOPHAGOGASTRODUODENOSCOPY (EGD) WITH PROPOFOL  N/A 02/26/2021   Procedure: ESOPHAGOGASTRODUODENOSCOPY (EGD) WITH PROPOFOL ;  Surgeon: Ozell Blunt, MD;  Location: [facility];  Service: Endoscopy;  Laterality: N/A;   NM MYOCAR PERF WALL MOTION  11/2011   lexiscan - normal study, EF 80% post-stress   POLYPECTOMY  02/26/2021   Procedure: POLYPECTOMY;  Surgeon:  Ozell Blunt, MD;  Location: [facility];  Service: Endoscopy;;   RENAL DOPPLER  12/2011   Right CIA with aneursymal dilatation 2.3x3.1   TRANSTHORACIC ECHOCARDIOGRAM  10/2008   EF 50-55%, borderline conc LVH, mild posterior wall hypokinesis; mild mitral annular calcification, prolapse of anterior leaflets, mild MR; RVSP 30-79mmHg; mild TR; mild calcification of AV leaflets    Allergies  Allergen Reactions   Niacin Other (See Comments)    Turned red & his skin burned   Niaspan [Niacin Er (Antihyperlipidemic)]     Turned red & his skin burned    Current Outpatient Medications  Medication Sig Dispense Refill   amLODipine  (NORVASC ) 10 MG tablet TAKE 1 TABLET BY MOUTH EVERY DAY 90 tablet 2   clopidogrel  (PLAVIX ) 75 MG tablet TAKE 1 TABLET BY MOUTH EVERY DAY 90 tablet 1   diphenhydramine -acetaminophen  (TYLENOL  PM) 25-500 MG TABS tablet Take 1 tablet by mouth at bedtime.     ezetimibe  (ZETIA ) 10 MG tablet TAKE 1 TABLET BY MOUTH EVERY DAY 90 tablet 0   Ferrous Sulfate (IRON PO) Take 6.5 mg by mouth daily.     lisinopril  (ZESTRIL ) 2.5 MG tablet TAKE 1 TABLET BY MOUTH EVERY DAY 90 tablet 2   magnesium  hydroxide (MILK OF MAGNESIA) 400 MG/5ML suspension Take 15 mLs by mouth 3 (three) times a week.     metoprolol  succinate (TOPROL -XL) 25 MG 24 hr tablet TAKE 1 TABLET (25 MG TOTAL) BY MOUTH DAILY. 90 tablet 3   Omega-3 Fatty Acids (FISH OIL ) 1200 MG CAPS Take 1 capsule (1,200 mg total) by mouth 2 (two) times daily. (Patient taking differently: Take 1 capsule by mouth daily.) 180 capsule 3   pantoprazole  (PROTONIX ) 40 MG tablet Take 1 tablet (40 mg total) by mouth daily. 90 tablet 0   rosuvastatin  (CRESTOR ) 40 MG tablet TAKE 1 TABLET BY MOUTH EVERY DAY 90 tablet 2   No current facility-administered medications for this visit.    Social History   Socioeconomic History   Marital status: Married    Spouse name: Not on file   Number of children: Not on file   Years of education: Not on file    Highest education level: Not on file  Occupational History   Not on  file  Tobacco Use   Smoking status: Former    Current packs/day: 0.00    Types: Cigarettes    Quit date: 01/13/2005    Years since quitting: 18.3    Passive exposure: Past   Smokeless tobacco: Never  Vaping Use   Vaping status: Never Used  Substance and Sexual Activity   Alcohol use: No   Drug use: No   Sexual activity: Not Currently  Other Topics Concern   Not on file  Social History Narrative   Not on file   Social Drivers of Health   Financial Resource Strain: Low Risk  (03/12/2023)   Overall Financial Resource Strain (CARDIA)    Difficulty of Paying Living Expenses: Not hard at all  Food Insecurity: No Food Insecurity (03/12/2023)   Hunger Vital Sign    Worried About Running Out of Food in the Last Year: Never true    Ran Out of Food in the Last Year: Never true  Transportation Needs: No Transportation Needs (03/12/2023)   PRAPARE - Administrator, Civil Service (Medical): No    Lack of Transportation (Non-Medical): No  Physical Activity: Inactive (03/12/2023)   Exercise Vital Sign    Days of Exercise per Week: 0 days    Minutes of Exercise per Session: 0 min  Stress: No Stress Concern Present (03/12/2023)   [employer] of Occupational Health - Occupational Stress Questionnaire    Feeling of Stress : Not at all  Social Connections: Socially Isolated (03/12/2023)   Social Connection and Isolation Panel [NHANES]    Frequency of Communication with Friends and Family: Never    Frequency of Social Gatherings with Friends and Family: Never    Attends Religious Services: Never    Database administrator or Organizations: No    Attends Banker Meetings: Never    Marital Status: Married  Catering manager Violence: Not At Risk (03/12/2023)   Humiliation, Afraid, Rape, and Kick questionnaire    Fear of Current or Ex-Partner: No    Emotionally Abused: No    Physically Abused: No     Sexually Abused: No   Social history is notable that he is married. He is over 18 grandchildren and at least 7 great grandchildren. He quit smoking 8 years ago. He remains active the  Family History  Problem Relation Age of Onset   Cancer Mother     ROS General: Negative; No fevers, chills, or night sweats;  HEENT:  No changes in vision or hearing, sinus congestion, difficulty swallowing Pulmonary: Positive for questionable wheezing.; No cough, shortness of breath, hemoptysis Cardiovascular: see HPI; No chest pain, presyncope, syncope, palpatations; occasional left ankle swelling GI: Negative; No nausea, vomiting, diarrhea, or abdominal pain GU: Negative; No dysuria, hematuria, or difficulty voiding Musculoskeletal: Negative; no myalgias, joint pain, or weakness Hematologic/Oncology: Negative; no easy bruising, bleeding Endocrine: Negative; no heat/cold intolerance; no diabetes Neuro: Negative; no changes in balance, headaches Skin: Negative; No rashes or skin lesions Psychiatric: Negative; No behavioral problems, depression Sleep: Negative; No snoring, daytime sleepiness, hypersomnolence, bruxism, restless legs, hypnogognic hallucinations, no cataplexy Other comprehensive 14 point system review is negative.   Physical Exam BP 126/84   Pulse (!) 57   Ht 5\' 9"  (1.753 m)   Wt 182 lb 3.2 oz (82.6 kg)   SpO2 97%   BMI 26.91 kg/m    Repeat blood pressure by me was 136/70  Wt Readings from Last 3 Encounters:  05/28/23 182 lb 3.2 oz (82.6 kg)  04/07/23 180 lb 3.2 oz (81.7 kg)  05/06/22 173 lb 3.2 oz (78.6 kg)   General: Alert, oriented, no distress.  Skin: normal turgor, no rashes, warm and dry HEENT: Normocephalic, atraumatic. Pupils equal round and reactive to light; sclera anicteric; extraocular muscles intact;  Nose without nasal septal hypertrophy Mouth/Parynx benign; Mallinpatti scale 3 Neck: No JVD, no carotid bruits; normal carotid upstroke Lungs: clear to  ausculatation and percussion; no wheezing or rales Chest wall: without tenderness to palpitation Heart: PMI not displaced, RRR, s1 s2 normal, 2/6 systolic murmur, no diastolic murmur, no rubs, gallops, thrills, or heaves Abdomen: soft, nontender; no hepatosplenomehaly, BS+; abdominal aorta nontender and not dilated by palpation. Back: no CVA tenderness Pulses 2+ Musculoskeletal: full range of motion, normal strength, no joint deformities Extremities: no clubbing cyanosis or edema, Homan's sign negative  Neurologic: grossly nonfocal; Cranial nerves grossly wnl Psychologic: Normal mood and affect    EKG Interpretation Date/Time:  Thursday May 28 2023 12:20:36 EDT Ventricular Rate:  57 PR Interval:  254 QRS Duration:  78 QT Interval:  410 QTC Calculation: 399 R Axis:   14  Text Interpretation: Sinus bradycardia with sinus arrhythmia with 1st degree A-V block Low voltage QRS When compared with ECG of 05-Mar-2022 16:31, PREVIOUS ECG IS PRESENT Confirmed by Magnus Schuller ([id number]) on 05/28/2023 1:11:51 PM    Jun 06, 2021 ECG (independently read by me): Sinus bradycardia at 55, 1st degree AV block, NSSTT changes    March 05, 2020 ECG (independently read by me): NSR at 78; nonspecific ST chhanges  March 2019 ECG (independently read by me): Normal sinus rhythm at 68 bpm.  First-degree AV block with a PR interval of 2 3 2  ms.  No ectopy.  No ST segment changes.  March 2018  ECG (independently read by me): NSR  At 78 with 1st degree AV block  September 2017 ECG (independently read by me): Sinus rhythm with first degree AV block with a PR interval of 270 ms.  QTc interval is normal.  There are no significant ST-T changes.  November 2016 ECG (independently read by me): Sinus bradycardia 58 bpm.,  First-degree AV block with increased PR interval at 272 ms.  ECG (independently read by me): normal sinus rhythm at 60 bpm.  First-degree AV block.  No significant ST-T changes.  QTc interval  normal.  May 2015 ECG (independently read by me): Normal sinus rhythm at 62 beats per minute.  First degree AV block.  Prior ECG: Normal sinus rhythm at 69 beats per minute. First degree AV block with PR interval 236 ms. QTc interval normal at 420 ms.  LABS:    Latest Ref Rng & Units 03/07/2022    4:21 AM 03/05/2022    7:54 PM 03/05/2022    4:40 PM  BMP  Glucose 70 - 99 mg/dL 191   478   BUN 8 - 23 mg/dL 19   15   Creatinine 2.95 - 1.24 mg/dL 6.21  3.08  6.57   Sodium 135 - 145 mmol/L 138   134   Potassium 3.5 - 5.1 mmol/L 4.2   4.1   Chloride 98 - 111 mmol/L 108   101   CO2 22 - 32 mmol/L 22   21   Calcium  8.9 - 10.3 mg/dL 8.4   8.8       Latest Ref Rng & Units 04/04/2022   10:14 AM 03/07/2022    4:21 AM 03/05/2022    4:40 PM  Hepatic Function  Total  Protein 6.5 - 8.1 g/dL  6.3  6.7   Albumin 3.5 - 5.0 g/dL  3.1  3.7   AST 0 - 40 IU/L 28  36  52   ALT 0 - 44 IU/L 32  55  61   Alk Phosphatase 38 - 126 U/L  56  74   Total Bilirubin 0.3 - 1.2 mg/dL  0.6  0.9       Latest Ref Rng & Units 03/07/2022    4:21 AM 03/05/2022    7:54 PM 03/05/2022    4:40 PM  CBC  WBC 4.0 - 10.5 K/uL 9.9  7.3  9.4   Hemoglobin 13.0 - 17.0 g/dL 96.0  45.4  09.8   Hematocrit 39.0 - 52.0 % 40.4  37.2  42.1   Platelets 150 - 400 K/uL 167  150  162    Lab Results  Component Value Date   MCV 94.0 03/07/2022   MCV 96.4 03/05/2022   MCV 95.0 03/05/2022   Lab Results  Component Value Date   TSH 4.68 05/06/2022   Lab Results  Component Value Date   HGBA1C 6.1 (H) 03/15/2020     Lipid Panel     Component Value Date/Time   CHOL 168 04/21/2022 1406   TRIG 171 (H) 04/21/2022 1406   HDL 46 04/21/2022 1406   CHOLHDL 3.7 04/21/2022 1406   CHOLHDL 3.0 09/14/2015 0854   VLDL 32 (H) 09/14/2015 0854   LDLCALC 92 04/21/2022 1406   LDLDIRECT 91 04/21/2022 1406    RADIOLOGY: No results found.  IMPRESSION: 1. Coronary artery disease involving native coronary artery of native heart without angina  pectoris   2. Hx of CABG: 1994   3. Essential hypertension   4. Nonrheumatic aortic valve stenosis   5. Hyperlipidemia with target LDL less than 70   6. Bilateral carotid artery stenosis   7. Iliac artery aneurysm (HCC)   8. Stage 3a chronic kidney disease (HCC)     ASSESSMENT AND PLAN: Mr. Gurpreet Mikhail is an 88 year-old Caucasian male who is 31 years status post CABG revascularization surgery with LIMA to his LAD, sequential graft to the OM1 and OM2, and sequential graft to the PDA and PLA vessel. He is 19 years since undergoing intervention to the vein graft supplying the intermediate vessel.  At that time he had an occluded graft to the RCA, as well as an occluded graft to the marginal vessel.  He has remained stable without anginal symptomatology and denies any exertional shortness of breath, PND orthopnea.  He was hospitalized in October 2023 most likely due to a viral infection and had symptomatic anemia, microcytic and iron deficient and required 2 units of packed red blood cell transfusion.  He had transaminitis as well as transient AKI.  An echo Doppler study revealed normal LV systolic function with mild LVH with mild to moderate aortic stenosis with a mean gradient of 16 and peak gradient of 26 mm.  In March 2023  carotid duplex imaging showed 40 to 59% bilateral ICA stenoses with ECA stenosis greater than 50% and normal subclavian flow bilaterally.  Presently, his blood pressure is stable on his current regimen of amlodipine  10 mg, metoprolol  succinate 25 mg and lisinopril  2.5 mg daily.  He is on rosuvastatin  40 mg, Zetia  10 mg, and over-the-counter fish oil  for hyperlipidemia.  He has been on long-term DAPT with clopidogrel  75 mg and denies any bruisability.  He is on pantoprazole  for GERD.  He has not  had recent laboratory and I am rechecking fasting labs with a comprehensive metabolic panel, CBC, TSH, lipid panel, and also will check LP(a).  He continues to have 2/6 systolic murmur in the  aortic area.  In 6 months I am recommending he undergo a follow-up echo Doppler study and following that echo I will transition him to the care of Dr. Randene Bustard who will see him in 6 to 8 months.  Millicent Ally, MD, FACC  05/30/2023 9:04 AM

## 2023-05-28 NOTE — Patient Instructions (Signed)
 Medication Instructions:   *If you need a refill on your cardiac medications before your next appointment, please call your pharmacy*  Lab Work: FASTING LIPID, CMET, CBC, LIPO A, TSH  If you have labs (blood work) drawn today and your tests are completely normal, you will receive your results only by: MyChart Message (if you have MyChart) OR A paper copy in the mail If you have any lab test that is abnormal or we need to change your treatment, we will call you to review the results.  Testing/Procedures: in 6 months  Your physician has requested that you have an echocardiogram. Echocardiography is a painless test that uses sound waves to create images of your heart. It provides your doctor with information about the size and shape of your heart and how well your heart's chambers and valves are working. This procedure takes approximately one hour. There are no restrictions for this procedure. Please do NOT wear cologne, perfume, aftershave, or lotions (deodorant is allowed). Please arrive 15 minutes prior to your appointment time.  Please note: We ask at that you not bring children with you during ultrasound (echo/ vascular) testing. Due to room size and safety concerns, children are not allowed in the ultrasound rooms during exams. Our front office staff cannot provide observation of children in our lobby area while testing is being conducted. An adult accompanying a patient to their appointment will only be allowed in the ultrasound room at the discretion of the ultrasound technician under special circumstances. We apologize for any inconvenience.    Follow-Up: 6-8 months with Dr Addie Holstein  At [facility], you and your health needs are our priority.  As part of our continuing mission to provide you with exceptional heart care, our providers are all part of one team.  This team includes your primary Cardiologist (physician) and Advanced Practice Providers or APPs (Physician Assistants and  Nurse Practitioners) who all work together to provide you with the care you need, when you need it.  Your next appointment:  We recommend signing up for the patient portal called "MyChart".  Sign up information is provided on this After Visit Summary.  MyChart is used to connect with patients for Virtual Visits (Telemedicine).  Patients are able to view lab/test results, encounter notes, upcoming appointments, etc.  Non-urgent messages can be sent to your provider as well.   To learn more about what you can do with MyChart, go to [email or web address].   Other Instructions

## 2023-05-30 ENCOUNTER — Encounter: Payer: Self-pay | Admitting: Cardiovascular Disease

## 2023-06-09 ENCOUNTER — Other Ambulatory Visit: Payer: Self-pay

## 2023-06-09 MED ORDER — CLOPIDOGREL BISULFATE 75 MG PO TABS: 75.0000 mg | ORAL_TABLET | Freq: Every day | ORAL | 3 refills | Status: AC

## 2023-06-12 ENCOUNTER — Ambulatory Visit (HOSPITAL_COMMUNITY)
Admission: RE | Admit: 2023-06-12 | Discharge: 2023-06-12 | Disposition: A | Discharge status: Home / Self Care | Source: Ambulatory Visit | Attending: Student | Admitting: Student

## 2023-06-12 DIAGNOSIS — I6521 Occlusion and stenosis of right carotid artery: Secondary | ICD-10-CM | POA: Diagnosis not present

## 2023-06-12 DIAGNOSIS — I779 Disorder of arteries and arterioles, unspecified: Secondary | ICD-10-CM

## 2023-06-12 DIAGNOSIS — I714 Abdominal aortic aneurysm, without rupture, unspecified: Secondary | ICD-10-CM

## 2023-06-12 LAB — CBC

## 2023-06-13 LAB — COMPREHENSIVE METABOLIC PANEL WITH GFR
ALT: 23 IU/L (ref 0–44)
AST: 20 IU/L (ref 0–40)
Albumin: 4.4 g/dL (ref 3.7–4.7)
Alkaline Phosphatase: 86 IU/L (ref 44–121)
BUN/Creatinine Ratio: 10 (ref 10–24)
BUN: 17 mg/dL (ref 8–27)
Bilirubin Total: 0.4 mg/dL (ref 0.0–1.2)
CO2: 21 mmol/L (ref 20–29)
Calcium: 9 mg/dL (ref 8.6–10.2)
Chloride: 99 mmol/L (ref 96–106)
Creatinine, Ser: 1.75 mg/dL — ABNORMAL HIGH (ref 0.76–1.27)
Globulin, Total: 2.2 g/dL (ref 1.5–4.5)
Glucose: 109 mg/dL — ABNORMAL HIGH (ref 70–99)
Potassium: 4.7 mmol/L (ref 3.5–5.2)
Sodium: 136 mmol/L (ref 134–144)
Total Protein: 6.6 g/dL (ref 6.0–8.5)
eGFR: 38 mL/min/{1.73_m2} — ABNORMAL LOW (ref >59)

## 2023-06-13 LAB — CBC
Hematocrit: 42.3 % (ref 37.5–51.0)
Hemoglobin: 14.1 g/dL (ref 13.0–17.7)
MCH: 31.6 pg (ref 26.6–33.0)
MCHC: 33.3 g/dL (ref 31.5–35.7)
MCV: 95 fL (ref 79–97)
Platelets: 227 10*3/uL (ref 150–450)
RBC: 4.46 x10E6/uL (ref 4.14–5.80)
RDW: 11.8 % (ref 11.6–15.4)
WBC: 6.7 10*3/uL (ref 3.4–10.8)

## 2023-06-13 LAB — LIPID PANEL
Chol/HDL Ratio: 3.3 ratio (ref 0.0–5.0)
Cholesterol, Total: 140 mg/dL (ref 100–199)
HDL: 43 mg/dL (ref >39)
LDL Chol Calc (NIH): 76 mg/dL (ref 0–99)
Triglycerides: 117 mg/dL (ref 0–149)
VLDL Cholesterol Cal: 21 mg/dL (ref 5–40)

## 2023-06-13 LAB — LIPOPROTEIN A (LPA): Lipoprotein (a): 606 nmol/L — ABNORMAL HIGH (ref <75.0)

## 2023-06-13 LAB — TSH: TSH: 6.72 u[IU]/mL — ABNORMAL HIGH (ref 0.450–4.500)

## 2023-06-15 ENCOUNTER — Ambulatory Visit: Payer: Self-pay | Admitting: Student

## 2023-06-15 ENCOUNTER — Ambulatory Visit: Payer: Self-pay | Admitting: *Deleted

## 2023-07-03 ENCOUNTER — Other Ambulatory Visit: Payer: Self-pay | Admitting: Family

## 2023-07-03 DIAGNOSIS — K219 Gastro-esophageal reflux disease without esophagitis: Secondary | ICD-10-CM

## 2023-07-09 ENCOUNTER — Other Ambulatory Visit: Payer: Self-pay | Admitting: Student

## 2023-07-15 ENCOUNTER — Other Ambulatory Visit: Payer: Self-pay | Admitting: *Deleted

## 2023-07-15 DIAGNOSIS — E785 Hyperlipidemia, unspecified: Secondary | ICD-10-CM

## 2023-07-15 DIAGNOSIS — I2510 Atherosclerotic heart disease of native coronary artery without angina pectoris: Secondary | ICD-10-CM

## 2023-07-15 DIAGNOSIS — Z951 Presence of aortocoronary bypass graft: Secondary | ICD-10-CM

## 2023-07-15 NOTE — Progress Notes (Signed)
 amb

## 2023-08-03 ENCOUNTER — Other Ambulatory Visit: Payer: Self-pay | Admitting: *Deleted

## 2023-08-03 MED ORDER — METOPROLOL SUCCINATE ER 25 MG PO TB24: 25.0000 mg | ORAL_TABLET | Freq: Every day | ORAL | 3 refills | Status: AC

## 2023-09-10 ENCOUNTER — Ambulatory Visit: Attending: Internal Medicine | Admitting: Pharmacist

## 2023-09-10 DIAGNOSIS — E785 Hyperlipidemia, unspecified: Secondary | ICD-10-CM | POA: Diagnosis not present

## 2023-09-10 DIAGNOSIS — I2510 Atherosclerotic heart disease of native coronary artery without angina pectoris: Secondary | ICD-10-CM | POA: Diagnosis not present

## 2023-09-10 DIAGNOSIS — Z951 Presence of aortocoronary bypass graft: Secondary | ICD-10-CM | POA: Diagnosis not present

## 2023-09-10 NOTE — Patient Instructions (Signed)
 It was nice meeting you two today  We would like your LDL (bad cholesterol) to be less than 70  Please continue your rosuvastatin  40mg  once a day  The medication we discussed today is called Repatha, which is an injection you would take once every 2 weeks  I will complete the prior authorization for you and contact you with the result  Once you start the medication we will recheck your fasting lipid panel in about 2-3 months  Please let us  know if you have any other questions  Medford Bolk, PharmD, BCACP, CDCES, CPP [facility] [address] [place], [place] [address] Phone: [phone]; Fax: [phone] 09/10/2023 2:12 PM

## 2023-09-10 NOTE — Progress Notes (Signed)
 Patient ID: Frederick Kirby                 DOB: 1935/08/21                    MRN: [id number]     HPI: Frederick Kirby is a 88 y.o. male patient referred to lipid clinic by Dr Burnard.  Now followed by Dr Court. PMH is significant for CAD, HTN, OSA, COPD, CKD, CV, hx of CABG, HLD and elevated LPA.  Patient presents today to discuss lipid management. Currently on maximum dose rosuvastatin  40mg  ocne daily along with fish oil  and ezetimibe  10mg  ocne daily. Patient reports no adverse effects. Previously on Niacin which caused flushing and then sunburns.   Current Medications:  Rosuvastatin  40mg   Ezetimibe  10mg  daily Fish Oil  1200mg  daily  Intolerances: Niacin  Risk Factors:  CAD Elevated LPA HTN CABG Hx of Stroke  LDL goal: <55  Labs: LPA 606, TC 140, Trigs 117, HDL 43, LDL 76 (06/12/23)  Past Medical History:  Diagnosis Date   COPD (chronic obstructive pulmonary disease) (HCC)    Coronary artery disease    Hyperlipidemia    Hypertension    Mild renal insufficiency    Myocardial infarction (HCC)    S/P CABG (coronary artery bypass graft) 1994   Stroke [facility])     Current Outpatient Medications on File Prior to Visit  Medication Sig Dispense Refill   amLODipine  (NORVASC ) 10 MG tablet TAKE 1 TABLET BY MOUTH EVERY DAY 90 tablet 2   clopidogrel  (PLAVIX ) 75 MG tablet Take 1 tablet (75 mg total) by mouth daily. 90 tablet 3   diphenhydramine -acetaminophen  (TYLENOL  PM) 25-500 MG TABS tablet Take 1 tablet by mouth at bedtime.     ezetimibe  (ZETIA ) 10 MG tablet TAKE 1 TABLET BY MOUTH EVERY DAY 90 tablet 3   Ferrous Sulfate (IRON PO) Take 6.5 mg by mouth daily.     folic acid -pyridoxine -cyancobalamin (FOLBIC) 2.5-25-2 MG TABS tablet Take 1 tablet by mouth daily.     lisinopril  (ZESTRIL ) 2.5 MG tablet TAKE 1 TABLET BY MOUTH EVERY DAY 90 tablet 2   magnesium  hydroxide (MILK OF MAGNESIA) 400 MG/5ML suspension Take 15 mLs by mouth 3 (three) times a week.     metoprolol  succinate (TOPROL -XL) 25  MG 24 hr tablet Take 1 tablet (25 mg total) by mouth daily. 90 tablet 3   Omega-3 Fatty Acids (FISH OIL ) 1200 MG CAPS Take 1 capsule (1,200 mg total) by mouth 2 (two) times daily. (Patient taking differently: Take 1 capsule by mouth daily.) 180 capsule 3   pantoprazole  (PROTONIX ) 40 MG tablet TAKE 1 TABLET BY MOUTH EVERY DAY 90 tablet 0   rosuvastatin  (CRESTOR ) 40 MG tablet TAKE 1 TABLET BY MOUTH EVERY DAY 90 tablet 2   No current facility-administered medications on file prior to visit.    Allergies  Allergen Reactions   Niacin Other (See Comments)    Turned red & his skin burned   Niaspan [Niacin Er (Antihyperlipidemic)]     Turned red & his skin burned    Assessment/Plan:  1. Hyperlipidemia - Patient's last LDL of 76 is above goal of <55. Aggressive goal due to history of stroke, CABG, and CAD. LPA also elevated at 606 which is above goal of <75. Since patient currently on high dose maximum intensity statin plus ezetimibe , recommend addition of PCSK9i.  Using demo pen, educated patient on mechanism of action, storage, site selection, administration, and possible adverse effects. Patient  voiced understanding. Will complete PA and contact patient when approved. Recheck lipid panel in 2-3 months  Medford Bolk, PharmD, BCACP, CDCES, CPP [facility] [address] [place], [place] [address] Phone: [phone]; Fax: [phone] 11/08/2023 10:38 AM

## 2023-09-26 ENCOUNTER — Other Ambulatory Visit: Payer: Self-pay | Admitting: Family

## 2023-09-26 DIAGNOSIS — K219 Gastro-esophageal reflux disease without esophagitis: Secondary | ICD-10-CM

## 2023-11-08 ENCOUNTER — Encounter: Payer: Self-pay | Admitting: Pharmacist

## 2023-11-08 ENCOUNTER — Telehealth: Payer: Self-pay | Admitting: Pharmacist

## 2023-11-08 DIAGNOSIS — E785 Hyperlipidemia, unspecified: Secondary | ICD-10-CM

## 2023-11-08 DIAGNOSIS — I2510 Atherosclerotic heart disease of native coronary artery without angina pectoris: Secondary | ICD-10-CM

## 2023-11-08 NOTE — Telephone Encounter (Signed)
 Please complete PA for Repatha and/or Praluent

## 2023-11-09 ENCOUNTER — Other Ambulatory Visit (HOSPITAL_COMMUNITY): Payer: Self-pay

## 2023-11-09 ENCOUNTER — Telehealth: Payer: Self-pay | Admitting: Pharmacy Technician

## 2023-11-09 MED ORDER — REPATHA SURECLICK 140 MG/ML ~~LOC~~ SOAJ: 1.0000 mL | SUBCUTANEOUS | 1 refills | Status: AC

## 2023-11-09 NOTE — Telephone Encounter (Signed)
 Pharmacy Patient Advocate Encounter   Received notification from Patient Advice Request messages that prior authorization for Repatha is required/requested.   Insurance verification completed.   The patient is insured through [facility] [employer].   Per test claim: PA required; PA submitted to above mentioned insurance via Latent Key/confirmation #/EOC [id number] Status is pending

## 2023-11-09 NOTE — Telephone Encounter (Signed)
 Pharmacy Patient Advocate Encounter  Received notification from [employer] that Prior Authorization for Repatha has been APPROVED from 11/09/23 to 05/07/24. Ran test claim, Copay is $47.00. This test claim was processed through [facility]- copay amounts may vary at other pharmacies due to pharmacy/plan contracts, or as the patient moves through the different stages of their insurance plan.   PA #/Case ID/Reference #: [id number]

## 2023-12-08 ENCOUNTER — Ambulatory Visit (HOSPITAL_COMMUNITY)
Admission: RE | Admit: 2023-12-08 | Discharge: 2023-12-08 | Disposition: A | Discharge status: Home / Self Care | Source: Ambulatory Visit | Attending: Internal Medicine | Admitting: Internal Medicine

## 2023-12-08 DIAGNOSIS — I350 Nonrheumatic aortic (valve) stenosis: Secondary | ICD-10-CM | POA: Insufficient documentation

## 2023-12-08 LAB — ECHOCARDIOGRAM COMPLETE
AR max vel: 1.7 cm2
AV Area VTI: 1.63 cm2
AV Area mean vel: 1.63 cm2
AV Mean grad: 10 mmHg
AV Peak grad: 17 mmHg
Ao pk vel: 2.06 m/s
Area-P 1/2: 2.99 cm2
S' Lateral: 3 cm

## 2023-12-20 ENCOUNTER — Other Ambulatory Visit: Payer: Self-pay | Admitting: Family

## 2023-12-20 DIAGNOSIS — K219 Gastro-esophageal reflux disease without esophagitis: Secondary | ICD-10-CM

## 2023-12-21 NOTE — Telephone Encounter (Signed)
 Complete

## 2024-02-15 ENCOUNTER — Other Ambulatory Visit: Payer: Self-pay

## 2024-02-15 MED ORDER — AMLODIPINE BESYLATE 10 MG PO TABS: 10.0000 mg | ORAL_TABLET | Freq: Every day | ORAL | 2 refills | Status: AC

## 2024-02-17 ENCOUNTER — Other Ambulatory Visit: Payer: Self-pay | Admitting: General Practice

## 2024-02-19 ENCOUNTER — Other Ambulatory Visit: Payer: Self-pay

## 2024-02-19 ENCOUNTER — Telehealth: Payer: Self-pay

## 2024-02-19 DIAGNOSIS — E785 Hyperlipidemia, unspecified: Secondary | ICD-10-CM

## 2024-02-19 DIAGNOSIS — I2510 Atherosclerotic heart disease of native coronary artery without angina pectoris: Secondary | ICD-10-CM

## 2024-02-19 MED ORDER — ROSUVASTATIN CALCIUM 40 MG PO TABS: 40.0000 mg | ORAL_TABLET | Freq: Every day | ORAL | 0 refills | Status: AC

## 2024-02-19 MED ORDER — LISINOPRIL 2.5 MG PO TABS: 2.5000 mg | ORAL_TABLET | Freq: Every day | ORAL | 0 refills | Status: AC

## 2024-02-19 NOTE — Telephone Encounter (Signed)
 Calling to release labs. Lipid panel released.

## 2024-02-19 NOTE — Telephone Encounter (Signed)
 In accordance with refill protocols, please review and address the following requirements before this medication refill can be authorized:  Labs  Pt needs labs within 365 days within normal range

## 2024-03-24 ENCOUNTER — Ambulatory Visit: Payer: PPO
